# Patient Record
Sex: Female | Born: 1960 | Race: White | Hispanic: No | Marital: Married | Smoking: Former smoker
Health system: Southern US, Community
[De-identification: ages and names within clinical notes are randomized; demographics above are authoritative.]

## PROBLEM LIST (undated history)

## (undated) DIAGNOSIS — N883 Incompetence of cervix uteri: Secondary | ICD-10-CM

## (undated) DIAGNOSIS — J3081 Allergic rhinitis due to animal (cat) (dog) hair and dander: Secondary | ICD-10-CM

## (undated) DIAGNOSIS — J449 Chronic obstructive pulmonary disease, unspecified: Secondary | ICD-10-CM

## (undated) DIAGNOSIS — Z9889 Other specified postprocedural states: Secondary | ICD-10-CM

## (undated) DIAGNOSIS — C50919 Malignant neoplasm of unspecified site of unspecified female breast: Secondary | ICD-10-CM

## (undated) DIAGNOSIS — R112 Nausea with vomiting, unspecified: Secondary | ICD-10-CM

## (undated) DIAGNOSIS — R87619 Unspecified abnormal cytological findings in specimens from cervix uteri: Secondary | ICD-10-CM

## (undated) DIAGNOSIS — M1990 Unspecified osteoarthritis, unspecified site: Secondary | ICD-10-CM

## (undated) DIAGNOSIS — J45909 Unspecified asthma, uncomplicated: Secondary | ICD-10-CM

## (undated) DIAGNOSIS — F419 Anxiety disorder, unspecified: Secondary | ICD-10-CM

## (undated) HISTORY — DX: Unspecified abnormal cytological findings in specimens from cervix uteri: R87.619

## (undated) HISTORY — DX: Anxiety disorder, unspecified: F41.9

## (undated) HISTORY — PX: RETINAL TEAR REPAIR CRYOTHERAPY: SHX5304

## (undated) HISTORY — DX: Malignant neoplasm of unspecified site of unspecified female breast: C50.919

---

## 1988-02-16 DIAGNOSIS — R87619 Unspecified abnormal cytological findings in specimens from cervix uteri: Secondary | ICD-10-CM

## 1988-02-16 HISTORY — PX: CERVICAL CONIZATION W/BX: SHX1330

## 1988-02-16 HISTORY — DX: Unspecified abnormal cytological findings in specimens from cervix uteri: R87.619

## 1995-02-16 HISTORY — PX: APPENDECTOMY: SHX54

## 1999-01-28 ENCOUNTER — Encounter (INDEPENDENT_AMBULATORY_CARE_PROVIDER_SITE_OTHER): Payer: Self-pay | Admitting: Specialist

## 1999-01-28 ENCOUNTER — Ambulatory Visit (HOSPITAL_COMMUNITY): Admission: RE | Admit: 1999-01-28 | Discharge: 1999-01-28 | Payer: Self-pay | Admitting: Gastroenterology

## 1999-08-31 ENCOUNTER — Other Ambulatory Visit: Admission: RE | Admit: 1999-08-31 | Discharge: 1999-08-31 | Payer: Self-pay | Admitting: Gynecology

## 2000-08-19 ENCOUNTER — Ambulatory Visit: Admission: RE | Admit: 2000-08-19 | Discharge: 2000-08-19 | Payer: Self-pay | Admitting: Internal Medicine

## 2000-08-30 ENCOUNTER — Other Ambulatory Visit: Admission: RE | Admit: 2000-08-30 | Discharge: 2000-08-30 | Payer: Self-pay | Admitting: Gynecology

## 2000-09-20 ENCOUNTER — Ambulatory Visit: Admission: RE | Admit: 2000-09-20 | Discharge: 2000-09-20 | Payer: Self-pay | Admitting: Internal Medicine

## 2001-07-11 ENCOUNTER — Encounter: Payer: Self-pay | Admitting: Gastroenterology

## 2001-07-11 ENCOUNTER — Encounter: Admission: RE | Admit: 2001-07-11 | Discharge: 2001-07-11 | Payer: Self-pay | Admitting: Gastroenterology

## 2001-09-07 ENCOUNTER — Other Ambulatory Visit: Admission: RE | Admit: 2001-09-07 | Discharge: 2001-09-07 | Payer: Self-pay | Admitting: Gynecology

## 2002-09-06 ENCOUNTER — Encounter: Payer: Self-pay | Admitting: Gynecology

## 2002-09-06 ENCOUNTER — Encounter: Admission: RE | Admit: 2002-09-06 | Discharge: 2002-09-06 | Payer: Self-pay | Admitting: Gynecology

## 2002-09-19 ENCOUNTER — Other Ambulatory Visit: Admission: RE | Admit: 2002-09-19 | Discharge: 2002-09-19 | Payer: Self-pay | Admitting: Gynecology

## 2003-05-07 ENCOUNTER — Other Ambulatory Visit: Admission: RE | Admit: 2003-05-07 | Discharge: 2003-05-07 | Payer: Self-pay | Admitting: Gynecology

## 2003-09-13 ENCOUNTER — Encounter: Admission: RE | Admit: 2003-09-13 | Discharge: 2003-09-13 | Payer: Self-pay | Admitting: Gynecology

## 2003-11-05 ENCOUNTER — Other Ambulatory Visit: Admission: RE | Admit: 2003-11-05 | Discharge: 2003-11-05 | Payer: Self-pay | Admitting: Gynecology

## 2004-10-15 ENCOUNTER — Encounter: Admission: RE | Admit: 2004-10-15 | Discharge: 2004-10-15 | Payer: Self-pay | Admitting: Gynecology

## 2005-07-15 ENCOUNTER — Other Ambulatory Visit: Admission: RE | Admit: 2005-07-15 | Discharge: 2005-07-15 | Payer: Self-pay | Admitting: Gynecology

## 2005-11-23 ENCOUNTER — Encounter: Admission: RE | Admit: 2005-11-23 | Discharge: 2005-11-23 | Payer: Self-pay | Admitting: Gynecology

## 2006-12-01 ENCOUNTER — Encounter: Admission: RE | Admit: 2006-12-01 | Discharge: 2006-12-01 | Payer: Self-pay | Admitting: Gynecology

## 2007-08-01 ENCOUNTER — Other Ambulatory Visit: Admission: RE | Admit: 2007-08-01 | Discharge: 2007-08-01 | Payer: Self-pay | Admitting: Gynecology

## 2007-12-04 ENCOUNTER — Encounter: Admission: RE | Admit: 2007-12-04 | Discharge: 2007-12-04 | Payer: Self-pay | Admitting: Gynecology

## 2008-12-09 ENCOUNTER — Encounter: Admission: RE | Admit: 2008-12-09 | Discharge: 2008-12-09 | Payer: Self-pay | Admitting: Gynecology

## 2009-12-09 ENCOUNTER — Encounter: Admission: RE | Admit: 2009-12-09 | Discharge: 2009-12-09 | Payer: Self-pay | Admitting: Gynecology

## 2009-12-17 ENCOUNTER — Encounter: Admission: RE | Admit: 2009-12-17 | Discharge: 2009-12-17 | Payer: Self-pay | Admitting: Gynecology

## 2010-11-05 ENCOUNTER — Other Ambulatory Visit: Payer: Self-pay | Admitting: Gynecology

## 2010-11-05 DIAGNOSIS — Z1231 Encounter for screening mammogram for malignant neoplasm of breast: Secondary | ICD-10-CM

## 2010-12-23 ENCOUNTER — Ambulatory Visit: Payer: Self-pay

## 2010-12-25 ENCOUNTER — Ambulatory Visit
Admission: RE | Admit: 2010-12-25 | Discharge: 2010-12-25 | Disposition: A | Discharge status: Home / Self Care | Payer: BC Managed Care – PPO | Source: Ambulatory Visit | Attending: Gynecology | Admitting: Gynecology

## 2010-12-25 DIAGNOSIS — Z1231 Encounter for screening mammogram for malignant neoplasm of breast: Secondary | ICD-10-CM

## 2011-01-05 ENCOUNTER — Other Ambulatory Visit: Payer: Self-pay | Admitting: Gynecology

## 2011-01-05 ENCOUNTER — Ambulatory Visit
Admission: RE | Admit: 2011-01-05 | Discharge: 2011-01-05 | Disposition: A | Discharge status: Home / Self Care | Payer: BC Managed Care – PPO | Source: Ambulatory Visit | Attending: Gynecology | Admitting: Gynecology

## 2011-01-05 DIAGNOSIS — R928 Other abnormal and inconclusive findings on diagnostic imaging of breast: Secondary | ICD-10-CM

## 2011-12-07 ENCOUNTER — Other Ambulatory Visit: Payer: Self-pay | Admitting: Gynecology

## 2011-12-07 DIAGNOSIS — Z1231 Encounter for screening mammogram for malignant neoplasm of breast: Secondary | ICD-10-CM

## 2012-01-06 ENCOUNTER — Ambulatory Visit
Admission: RE | Admit: 2012-01-06 | Discharge: 2012-01-06 | Disposition: A | Discharge status: Home / Self Care | Payer: BC Managed Care – PPO | Source: Ambulatory Visit | Attending: Gynecology | Admitting: Gynecology

## 2012-01-06 DIAGNOSIS — Z1231 Encounter for screening mammogram for malignant neoplasm of breast: Secondary | ICD-10-CM

## 2012-12-11 ENCOUNTER — Other Ambulatory Visit: Payer: Self-pay

## 2012-12-11 DIAGNOSIS — Z1231 Encounter for screening mammogram for malignant neoplasm of breast: Secondary | ICD-10-CM

## 2013-01-16 ENCOUNTER — Ambulatory Visit
Admission: RE | Admit: 2013-01-16 | Discharge: 2013-01-16 | Disposition: A | Discharge status: Home / Self Care | Payer: BC Managed Care – PPO | Source: Ambulatory Visit

## 2013-01-16 DIAGNOSIS — Z1231 Encounter for screening mammogram for malignant neoplasm of breast: Secondary | ICD-10-CM

## 2013-02-20 ENCOUNTER — Other Ambulatory Visit: Payer: Self-pay | Admitting: Gynecology

## 2013-02-20 DIAGNOSIS — Z803 Family history of malignant neoplasm of breast: Secondary | ICD-10-CM

## 2013-03-02 ENCOUNTER — Ambulatory Visit
Admission: RE | Admit: 2013-03-02 | Discharge: 2013-03-02 | Disposition: A | Discharge status: Home / Self Care | Payer: BC Managed Care – PPO | Source: Ambulatory Visit | Attending: Gynecology | Admitting: Gynecology

## 2013-03-02 DIAGNOSIS — Z803 Family history of malignant neoplasm of breast: Secondary | ICD-10-CM

## 2013-03-02 MED ORDER — GADOBENATE DIMEGLUMINE 529 MG/ML IV SOLN
14.0000 mL | Freq: Once | INTRAVENOUS | Status: AC | PRN
  Administered 2013-03-02: 14 mL via INTRAVENOUS

## 2013-04-29 ENCOUNTER — Encounter (HOSPITAL_BASED_OUTPATIENT_CLINIC_OR_DEPARTMENT_OTHER): Payer: Self-pay | Admitting: Emergency Medicine

## 2013-04-29 ENCOUNTER — Emergency Department (HOSPITAL_BASED_OUTPATIENT_CLINIC_OR_DEPARTMENT_OTHER): Payer: BC Managed Care – PPO

## 2013-04-29 ENCOUNTER — Emergency Department (HOSPITAL_BASED_OUTPATIENT_CLINIC_OR_DEPARTMENT_OTHER)
Admission: EM | Admit: 2013-04-29 | Discharge: 2013-04-30 | Disposition: A | Discharge status: Home / Self Care | Payer: BC Managed Care – PPO | Attending: Emergency Medicine | Admitting: Emergency Medicine

## 2013-04-29 DIAGNOSIS — B9789 Other viral agents as the cause of diseases classified elsewhere: Secondary | ICD-10-CM | POA: Insufficient documentation

## 2013-04-29 DIAGNOSIS — B349 Viral infection, unspecified: Secondary | ICD-10-CM

## 2013-04-29 DIAGNOSIS — F17200 Nicotine dependence, unspecified, uncomplicated: Secondary | ICD-10-CM | POA: Insufficient documentation

## 2013-04-29 DIAGNOSIS — Z79899 Other long term (current) drug therapy: Secondary | ICD-10-CM | POA: Insufficient documentation

## 2013-04-29 DIAGNOSIS — R062 Wheezing: Secondary | ICD-10-CM

## 2013-04-29 HISTORY — DX: Allergic rhinitis due to animal (cat) (dog) hair and dander: J30.81

## 2013-04-29 MED ORDER — IPRATROPIUM-ALBUTEROL 0.5-2.5 (3) MG/3ML IN SOLN
RESPIRATORY_TRACT | Status: AC
  Administered 2013-04-29: 23:00:00
  Filled 2013-04-29: qty 3

## 2013-04-29 MED ORDER — ALBUTEROL SULFATE (2.5 MG/3ML) 0.083% IN NEBU
INHALATION_SOLUTION | RESPIRATORY_TRACT | Status: AC
  Administered 2013-04-29: 23:00:00
  Filled 2013-04-29: qty 3

## 2013-04-29 MED ORDER — IPRATROPIUM-ALBUTEROL 0.5-2.5 (3) MG/3ML IN SOLN
3.0000 mL | Freq: Once | RESPIRATORY_TRACT | Status: AC
  Administered 2013-04-29: 3 mL via RESPIRATORY_TRACT

## 2013-04-29 MED ORDER — IPRATROPIUM-ALBUTEROL 18-103 MCG/ACT IN AERO: 2.0000 | INHALATION_SPRAY | RESPIRATORY_TRACT | Status: DC | PRN

## 2013-04-29 MED ORDER — CETIRIZINE HCL 10 MG PO TABS: 10.0000 mg | ORAL_TABLET | Freq: Every day | ORAL | Status: DC

## 2013-04-29 MED ORDER — ALBUTEROL SULFATE (2.5 MG/3ML) 0.083% IN NEBU
2.5000 mg | INHALATION_SOLUTION | Freq: Once | RESPIRATORY_TRACT | Status: AC
  Administered 2013-04-29: 2.5 mg via RESPIRATORY_TRACT

## 2013-04-29 NOTE — ED Notes (Signed)
Flu sx since Thursday night- today feels more SOB

## 2013-04-29 NOTE — ED Provider Notes (Signed)
CSN: [id number]     Arrival date & time 04/29/13  2051 History   None    This chart was scribed for Trystin Terhune Alfonso Patten, MD by Forrestine Him, ED Scribe. This patient was seen in room MH12/MH12 and the patient's care was started 11:04 PM.   Chief Complaint  Patient presents with  . Shortness of Breath   Patient is a 53 y.o. female presenting with URI. The history is provided by the patient. No language interpreter was used.  URI Presenting symptoms: fever   Presenting symptoms: no congestion, no cough and no sore throat   Severity:  Mild Onset quality:  Gradual Timing:  Intermittent Progression:  Unchanged Chronicity:  New Relieved by:  Nothing Worsened by:  Nothing tried Ineffective treatments:  None tried Associated symptoms: myalgias, sneezing and wheezing   Associated symptoms: no neck pain and no swollen glands   Risk factors: not elderly, no recent illness and no recent travel     HPI Comments: Rachael Peters is a 53 y.o. female with a PMHx of cat allergies who presents to the Emergency Department complaining of Flu like symptoms x 4 days. Pt states she had the Flu this past weekend consisting of a fever, nausea, chills, and decrease in appetite. States some symptoms have resolved, but has noted new wheezing and chest congestion. She denies trying anything OTC for her recent symptoms. Denies any known allergies to medications. Pt has no other pertinent medical history. No other concerns this visit.  Past Medical History  Diagnosis Date  . Cat allergies    Past Surgical History  Procedure Laterality Date  . Cervical conization w/bx    . Appendectomy     No family history on file. History  Substance Use Topics  . Smoking status: Current Every Day Smoker    Types: Cigarettes  . Smokeless tobacco: Never Used  . Alcohol Use: No   OB History   Grav Para Term Preterm Abortions TAB SAB Ect Mult Living                 Review of Systems  Constitutional: Positive for  fever, chills and appetite change.  HENT: Positive for sneezing. Negative for congestion and sore throat.   Eyes: Negative for redness.  Respiratory: Positive for wheezing. Negative for cough.   Cardiovascular: Negative for chest pain, palpitations and leg swelling.  Gastrointestinal: Positive for nausea.  Musculoskeletal: Positive for myalgias. Negative for neck pain.  Skin: Negative for rash.  Psychiatric/Behavioral: Negative for confusion.  All other systems reviewed and are negative.      Allergies  Review of patient's allergies indicates no known allergies.  Home Medications   Current Outpatient Rx  Name  Route  Sig  Dispense  Refill  . albuterol (PROVENTIL HFA;VENTOLIN HFA) 108 (90 BASE) MCG/ACT inhaler   Inhalation   Inhale into the lungs every 6 (six) hours as needed for wheezing or shortness of breath.         . escitalopram (LEXAPRO) 5 MG tablet   Oral   Take 5 mg by mouth every other day.          Triage Vitals: BP 127/77  Pulse 89  Temp(Src) 98.6 F (37 C) (Oral)  Resp 20  Ht 5\' 10"  (1.778 m)  Wt 150 lb (68.04 kg)  BMI 21.52 kg/m2  SpO2 96%   Physical Exam  Nursing note and vitals reviewed. Constitutional: She is oriented to person, place, and time. She appears well-developed and well-nourished.  No distress.  HENT:  Head: Normocephalic and atraumatic.  Mouth/Throat: Uvula is midline and oropharynx is clear and moist. No oropharyngeal exudate.  Eyes: Conjunctivae and EOM are normal. Pupils are equal, round, and reactive to light.  Neck: Normal range of motion. Neck supple.  Cardiovascular: Normal rate, regular rhythm and normal heart sounds.   Pulmonary/Chest: Effort normal and breath sounds normal. No stridor. No respiratory distress. She has no wheezes. She has no rales.  Abdominal: Soft. She exhibits no distension. There is no tenderness. There is no rebound and no guarding.  Musculoskeletal: Normal range of motion. She exhibits no edema.   Neurological: She is alert and oriented to person, place, and time.  Skin: Skin is warm and dry.  Psychiatric: She has a normal mood and affect. Judgment normal.    ED Course  Procedures (including critical care time)  DIAGNOSTIC STUDIES: Oxygen Saturation is 96% on RA, adequate by my interpretation.    COORDINATION OF CARE: 11:17 PM- Will give albuterol treatment. Discussed treatment plan with pt at bedside and pt agreed to plan.     Labs Review Labs Reviewed - No data to display Imaging Review No results found.   EKG Interpretation None      MDM   Final diagnoses:  None    Viral syndrome with wheezing, will prescribe combivent inhaler have recommended smoking cessation and mucinex DM for relief and tylenol alternating with motrin for fever control. Patient verbalizes understanding and agrees to follow up  I personally performed the services described in this documentation, which was scribed in my presence. The recorded information has been reviewed and is accurate.    Carlisle Beers, MD 04/30/13 [phone]

## 2014-01-23 ENCOUNTER — Other Ambulatory Visit: Payer: Self-pay

## 2014-01-23 DIAGNOSIS — Z1231 Encounter for screening mammogram for malignant neoplasm of breast: Secondary | ICD-10-CM

## 2014-02-13 ENCOUNTER — Ambulatory Visit
Admission: RE | Admit: 2014-02-13 | Discharge: 2014-02-13 | Disposition: A | Discharge status: Home / Self Care | Payer: BC Managed Care – PPO | Source: Ambulatory Visit

## 2014-02-13 DIAGNOSIS — Z1231 Encounter for screening mammogram for malignant neoplasm of breast: Secondary | ICD-10-CM

## 2014-07-26 ENCOUNTER — Other Ambulatory Visit: Payer: Self-pay | Admitting: Cardiology

## 2014-07-26 DIAGNOSIS — R0602 Shortness of breath: Secondary | ICD-10-CM

## 2014-07-26 DIAGNOSIS — R079 Chest pain, unspecified: Secondary | ICD-10-CM

## 2014-07-29 ENCOUNTER — Ambulatory Visit
Admission: RE | Admit: 2014-07-29 | Discharge: 2014-07-29 | Disposition: A | Discharge status: Home / Self Care | Payer: BLUE CROSS/BLUE SHIELD | Source: Ambulatory Visit | Attending: Cardiology | Admitting: Cardiology

## 2014-07-29 ENCOUNTER — Ambulatory Visit
Admission: RE | Admit: 2014-07-29 | Discharge: 2014-07-29 | Disposition: A | Discharge status: Home / Self Care | Payer: No Typology Code available for payment source | Source: Ambulatory Visit | Attending: Cardiology | Admitting: Cardiology

## 2014-07-29 DIAGNOSIS — R079 Chest pain, unspecified: Secondary | ICD-10-CM

## 2014-07-29 DIAGNOSIS — R0602 Shortness of breath: Secondary | ICD-10-CM

## 2014-07-30 ENCOUNTER — Ambulatory Visit: Payer: Self-pay | Admitting: Internal Medicine

## 2015-01-30 ENCOUNTER — Other Ambulatory Visit: Payer: Self-pay

## 2015-01-30 DIAGNOSIS — Z803 Family history of malignant neoplasm of breast: Secondary | ICD-10-CM

## 2015-01-30 DIAGNOSIS — Z1231 Encounter for screening mammogram for malignant neoplasm of breast: Secondary | ICD-10-CM

## 2015-02-28 ENCOUNTER — Ambulatory Visit
Admission: RE | Admit: 2015-02-28 | Discharge: 2015-02-28 | Disposition: A | Discharge status: Home / Self Care | Payer: BLUE CROSS/BLUE SHIELD | Source: Ambulatory Visit

## 2015-02-28 DIAGNOSIS — Z1231 Encounter for screening mammogram for malignant neoplasm of breast: Secondary | ICD-10-CM

## 2015-02-28 DIAGNOSIS — Z803 Family history of malignant neoplasm of breast: Secondary | ICD-10-CM

## 2015-03-15 ENCOUNTER — Encounter (HOSPITAL_BASED_OUTPATIENT_CLINIC_OR_DEPARTMENT_OTHER): Payer: Self-pay | Admitting: *Deleted

## 2015-03-15 ENCOUNTER — Emergency Department (HOSPITAL_BASED_OUTPATIENT_CLINIC_OR_DEPARTMENT_OTHER)
Admission: EM | Admit: 2015-03-15 | Discharge: 2015-03-15 | Disposition: A | Discharge status: Home / Self Care | Payer: BLUE CROSS/BLUE SHIELD | Attending: Emergency Medicine | Admitting: Emergency Medicine

## 2015-03-15 ENCOUNTER — Emergency Department (HOSPITAL_BASED_OUTPATIENT_CLINIC_OR_DEPARTMENT_OTHER): Payer: BLUE CROSS/BLUE SHIELD

## 2015-03-15 DIAGNOSIS — J45901 Unspecified asthma with (acute) exacerbation: Secondary | ICD-10-CM | POA: Diagnosis not present

## 2015-03-15 DIAGNOSIS — Z87891 Personal history of nicotine dependence: Secondary | ICD-10-CM | POA: Diagnosis not present

## 2015-03-15 DIAGNOSIS — Z79899 Other long term (current) drug therapy: Secondary | ICD-10-CM | POA: Diagnosis not present

## 2015-03-15 DIAGNOSIS — Z8739 Personal history of other diseases of the musculoskeletal system and connective tissue: Secondary | ICD-10-CM | POA: Diagnosis not present

## 2015-03-15 DIAGNOSIS — R0602 Shortness of breath: Secondary | ICD-10-CM | POA: Diagnosis present

## 2015-03-15 HISTORY — DX: Unspecified osteoarthritis, unspecified site: M19.90

## 2015-03-15 MED ORDER — METHYLPREDNISOLONE SODIUM SUCC 125 MG IJ SOLR: 125.0000 mg | Freq: Once | INTRAMUSCULAR | Status: DC

## 2015-03-15 MED ORDER — IPRATROPIUM BROMIDE 0.02 % IN SOLN
RESPIRATORY_TRACT | Status: AC
Start: 2015-03-15 — End: 2015-03-15
  Administered 2015-03-15: 0.5 mg via RESPIRATORY_TRACT
  Filled 2015-03-15: qty 2.5

## 2015-03-15 MED ORDER — ALBUTEROL SULFATE HFA 108 (90 BASE) MCG/ACT IN AERS
2.0000 | INHALATION_SPRAY | RESPIRATORY_TRACT | Status: DC | PRN
  Administered 2015-03-15: 2 via RESPIRATORY_TRACT
  Filled 2015-03-15: qty 6.7

## 2015-03-15 MED ORDER — DEXAMETHASONE SODIUM PHOSPHATE 10 MG/ML IJ SOLN
10.0000 mg | Freq: Once | INTRAMUSCULAR | Status: AC
  Administered 2015-03-15: 10 mg via INTRAMUSCULAR
  Filled 2015-03-15: qty 1

## 2015-03-15 MED ORDER — ALBUTEROL SULFATE (2.5 MG/3ML) 0.083% IN NEBU
INHALATION_SOLUTION | RESPIRATORY_TRACT | Status: AC
  Administered 2015-03-15: 5 mg via RESPIRATORY_TRACT
  Filled 2015-03-15: qty 6

## 2015-03-15 MED ORDER — PREDNISONE 20 MG PO TABS: 40.0000 mg | ORAL_TABLET | Freq: Every day | ORAL | Status: DC

## 2015-03-15 MED ORDER — IPRATROPIUM BROMIDE 0.02 % IN SOLN
0.5000 mg | RESPIRATORY_TRACT | Status: AC
  Administered 2015-03-15: 0.5 mg via RESPIRATORY_TRACT

## 2015-03-15 MED ORDER — ALBUTEROL SULFATE (2.5 MG/3ML) 0.083% IN NEBU
5.0000 mg | INHALATION_SOLUTION | RESPIRATORY_TRACT | Status: AC
  Administered 2015-03-15: 5 mg via RESPIRATORY_TRACT

## 2015-03-15 NOTE — ED Notes (Signed)
Patient is alert and oriented x4.  She is complaining of shortness of breath.  Patient has a history asthma. She states that the shortness of breath started last night and she has been in bed all day.  Lung sounds are Tight with exhaling wheezes.

## 2015-03-15 NOTE — ED Notes (Signed)
Notified [place], [place], of patients oxygen sat 92% - states okay to discharge patient home at this time.

## 2015-03-15 NOTE — Discharge Instructions (Signed)
Your shortness of breath was likely due to an asthma exacerbation. Your chest x-ray was negative for any pneumonia. Please take your medications as prescribed. Follow-up with your doctor next week for reevaluation and further medication management. Return to ED for any new or worsening symptoms as we discussed.  Asthma, Acute Bronchospasm Acute bronchospasm caused by asthma is also referred to as an asthma attack. Bronchospasm means your air passages become narrowed. The narrowing is caused by inflammation and tightening of the muscles in the air tubes (bronchi) in your lungs. This can make it hard to breathe or cause you to wheeze and cough. CAUSES Possible triggers are:  Animal dander from the skin, hair, or feathers of animals.  Dust mites contained in house dust.  Cockroaches.  Pollen from trees or grass.  Mold.  Cigarette or tobacco smoke.  Air pollutants such as dust, household cleaners, hair sprays, aerosol sprays, paint fumes, strong chemicals, or strong odors.  Cold air or weather changes. Cold air may trigger inflammation. Winds increase molds and pollens in the air.  Strong emotions such as crying or laughing hard.  Stress.  Certain medicines such as aspirin or beta-blockers.  Sulfites in foods and drinks, such as dried fruits and wine.  Infections or inflammatory conditions, such as a flu, cold, or inflammation of the nasal membranes (rhinitis).  Gastroesophageal reflux disease (GERD). GERD is a condition where stomach acid backs up into your esophagus.  Exercise or strenuous activity. SIGNS AND SYMPTOMS   Wheezing.  Excessive coughing, particularly at night.  Chest tightness.  Shortness of breath. DIAGNOSIS  Your health care provider will ask you about your medical history and perform a physical exam. A chest X-ray or blood testing may be performed to look for other causes of your symptoms or other conditions that may have triggered your asthma  attack. TREATMENT  Treatment is aimed at reducing inflammation and opening up the airways in your lungs. Most asthma attacks are treated with inhaled medicines. These include quick relief or rescue medicines (such as bronchodilators) and controller medicines (such as inhaled corticosteroids). These medicines are sometimes given through an inhaler or a nebulizer. Systemic steroid medicine taken by mouth or given through an IV tube also can be used to reduce the inflammation when an attack is moderate or severe. Antibiotic medicines are only used if a bacterial infection is present.  HOME CARE INSTRUCTIONS   Rest.  Drink plenty of liquids. This helps the mucus to remain thin and be easily coughed up. Only use caffeine in moderation and do not use alcohol until you have recovered from your illness.  Do not smoke. Avoid being exposed to secondhand smoke.  You play a critical role in keeping yourself in good health. Avoid exposure to things that cause you to wheeze or to have breathing problems.  Keep your medicines up-to-date and available. Carefully follow your health care provider's treatment plan.  Take your medicine exactly as prescribed.  When pollen or pollution is bad, keep windows closed and use an air conditioner or go to places with air conditioning.  Asthma requires careful medical care. See your health care provider for a follow-up as advised. If you are more than [redacted] weeks pregnant and you were prescribed any new medicines, let your obstetrician know about the visit and how you are doing. Follow up with your health care provider as directed.  After you have recovered from your asthma attack, make an appointment with your outpatient doctor to talk about ways to  reduce the likelihood of future attacks. If you do not have a doctor who manages your asthma, make an appointment with a primary care doctor to discuss your asthma. SEEK IMMEDIATE MEDICAL CARE IF:   You are getting  worse.  You have trouble breathing. If severe, call your local emergency services (911 in the [place]  You develop chest pain or discomfort.  You are vomiting.  You are not able to keep fluids down.  You are coughing up yellow, green, brown, or bloody sputum.  You have a fever and your symptoms suddenly get worse.  You have trouble swallowing. MAKE SURE YOU:   Understand these instructions.  Will watch your condition.  Will get help right away if you are not doing well or get worse.   This information is not intended to replace advice given to you by your health care provider. Make sure you discuss any questions you have with your health care provider.   Document Released: 05/19/2006 Document Revised: 02/06/2013 Document Reviewed: 08/09/2012 Elsevier Interactive Patient Education [employer].

## 2015-03-15 NOTE — ED Notes (Addendum)
Patient transported to X-ray 

## 2015-03-15 NOTE — ED Provider Notes (Signed)
CSN: [id number]     Arrival date & time 03/15/15  1939 History   First MD Initiated Contact with Patient 03/15/15 1952     Chief Complaint  Patient presents with  . Shortness of Breath     (Consider location/radiation/quality/duration/timing/severity/associated sxs/prior Treatment) HPI Rachael Peters is a 55 y.o. female with a history of asthma, comes in for evaluation of shortness of breath. Patient reports since yesterday evening she has had a productive cough with yellow sputum. She also reports a history of asthma that is usually well-controlled with a short acting inhaler. However, she reports this evening becoming increasingly more short of breath, not relieved by her albuterol inhaler. She reports that she may be allergic to cats, she has 3 cats and they were all in her bed today and she believes this may have exacerbated her symptoms. No fevers, chills, leg swelling, recent travel, surgeries, immobilizations or history of PE.  Past Medical History  Diagnosis Date  . Cat allergies   . Arthritis    Past Surgical History  Procedure Laterality Date  . Cervical conization w/bx    . Appendectomy     No family history on file. Social History  Substance Use Topics  . Smoking status: Former Smoker    Types: Cigarettes  . Smokeless tobacco: Never Used  . Alcohol Use: No   OB History    No data available     Review of Systems A 10 point review of systems was completed and was negative except for pertinent positives and negatives as mentioned in the history of present illness     Allergies  Review of patient's allergies indicates no known allergies.  Home Medications   Prior to Admission medications   Medication Sig Start Date End Date Taking? Authorizing Provider  albuterol (PROVENTIL HFA;VENTOLIN HFA) 108 (90 BASE) MCG/ACT inhaler Inhale into the lungs every 6 (six) hours as needed for wheezing or shortness of breath.    Historical Provider, MD  albuterol-ipratropium  (COMBIVENT) 18-103 MCG/ACT inhaler Inhale 2 puffs into the lungs every 4 (four) hours as needed for wheezing or shortness of breath. 04/29/13   April Palumbo, MD  cetirizine (ZYRTEC ALLERGY) 10 MG tablet Take 1 tablet (10 mg total) by mouth daily. 04/29/13   April Palumbo, MD  escitalopram (LEXAPRO) 5 MG tablet Take 5 mg by mouth every other day.    Historical Provider, MD  predniSONE (DELTASONE) 20 MG tablet Take 2 tablets (40 mg total) by mouth daily. 03/15/15   Comer Locket, PA-C   BP 126/74 mmHg  Pulse 90  Temp(Src) 98.5 F (36.9 C) (Oral)  Resp 22  Ht 5\' 10"  (1.778 m)  Wt 70.308 kg  BMI 22.24 kg/m2  SpO2 92% Physical Exam  Constitutional: She is oriented to person, place, and time. She appears well-developed and well-nourished.  HENT:  Head: Normocephalic and atraumatic.  Mouth/Throat: Oropharynx is clear and moist.  Eyes: Conjunctivae are normal. Pupils are equal, round, and reactive to light. Right eye exhibits no discharge. Left eye exhibits no discharge. No scleral icterus.  Neck: Neck supple.  Cardiovascular: Normal rate, regular rhythm and normal heart sounds.   Pulmonary/Chest: Effort normal and breath sounds normal. No respiratory distress. She has no rales.  Patient has diffuse inspiratory and expiratory wheezes. Worse on the right side.  Abdominal: Soft. There is no tenderness.  Musculoskeletal: She exhibits no tenderness.  Neurological: She is alert and oriented to person, place, and time.  Cranial Nerves II-XII grossly intact  Skin: Skin is warm and dry. No rash noted.  Psychiatric: She has a normal mood and affect.  Nursing note and vitals reviewed.   ED Course  Procedures (including critical care time) Labs Review Labs Reviewed - No data to display  Imaging Review Dg Chest 2 View  03/15/2015  CLINICAL DATA:  55 year old with current history of asthma presenting with 1 day history of wheezing and shortness of breath. EXAM: CHEST  2 VIEW COMPARISON:   07/29/2014, 04/29/2013. FINDINGS: Cardiac silhouette normal in size, unchanged. Thoracic aorta mildly tortuous and atherosclerotic, unchanged. Hilar and mediastinal contours otherwise unremarkable. Hyperinflation, mildly prominent bronchovascular markings diffusely and mild central peribronchial thickening, more so than on the prior examinations. Lungs otherwise clear. No localized airspace consolidation. No pleural effusions. No pneumothorax. Normal pulmonary vascularity. Visualized bony thorax intact. IMPRESSION: Mild changes of acute asthma and/or bronchitis without focal airspace pneumonia. Electronically Signed   By: Evangeline Dakin M.D.   On: 03/15/2015 20:33   I have personally reviewed and evaluated these images and lab results as part of my medical decision-making.   EKG Interpretation None     Meds given in ED:  Medications  albuterol (PROVENTIL HFA;VENTOLIN HFA) 108 (90 Base) MCG/ACT inhaler 2 puff (2 puffs Inhalation Given 03/15/15 2014)  albuterol (PROVENTIL) (2.5 MG/3ML) 0.083% nebulizer solution 5 mg (5 mg Nebulization Given 03/15/15 1954)  ipratropium (ATROVENT) nebulizer solution 0.5 mg (0.5 mg Nebulization Given 03/15/15 1953)  dexamethasone (DECADRON) injection 10 mg (10 mg Intramuscular Given 03/15/15 2029)    New Prescriptions   PREDNISONE (DELTASONE) 20 MG TABLET    Take 2 tablets (40 mg total) by mouth daily.   Filed Vitals:   03/15/15 1954 03/15/15 2000 03/15/15 2123 03/15/15 2126  BP:   126/74   Pulse:  95  90  Temp:      TempSrc:      Resp:      Height:      Weight:      SpO2: 91% 99%  92%    MDM  Rachael Peters is a 55 y.o. female with history of asthma who comes in for evaluation of shortness of breath. Patient symptoms consistent with asthma exacerbation. Due to productive cough at home with shortness of breath symptoms, will obtain chest x-ray. Chest x-ray is negative for focal consolidation and shows some bronchitic changes. Patient received one  breathing treatment in the emergency department and responded very well. She reports she feels much better. Her wheezing has greatly improved. She was given 10 mg of IM Decadron in the emergency department and also given another albuterol inhaler. She'll be discharged home with a steroid pack and instructions to follow-up with PCP next week for reevaluation. Prior to discharge, I personally checked patient's oxygen saturation was 95% on room air. Low suspicion for PE, low wells score. Patient and husband at bedside verbalized understanding of instructions and agrees with plan and subsequent discharge. Final diagnoses:  Asthma exacerbation        Comer Locket, PA-C 03/16/15 [place], MD 03/19/15 [phone]

## 2015-03-15 NOTE — ED Notes (Signed)
EDPA at bedside, oxygen sat 95%. Patient states she is breathing better. EDPA states ok to discharge.

## 2015-06-05 ENCOUNTER — Encounter: Payer: Self-pay | Admitting: Obstetrics and Gynecology

## 2015-06-16 ENCOUNTER — Encounter: Payer: Self-pay | Admitting: Obstetrics and Gynecology

## 2015-06-16 ENCOUNTER — Ambulatory Visit (INDEPENDENT_AMBULATORY_CARE_PROVIDER_SITE_OTHER): Payer: BLUE CROSS/BLUE SHIELD | Admitting: Obstetrics and Gynecology

## 2015-06-16 VITALS — BP 122/88 | HR 68 | Resp 14 | Ht 69.5 in | Wt 156.2 lb

## 2015-06-16 DIAGNOSIS — N951 Menopausal and female climacteric states: Secondary | ICD-10-CM | POA: Diagnosis not present

## 2015-06-16 DIAGNOSIS — Z803 Family history of malignant neoplasm of breast: Secondary | ICD-10-CM

## 2015-06-16 DIAGNOSIS — Z01419 Encounter for gynecological examination (general) (routine) without abnormal findings: Secondary | ICD-10-CM | POA: Diagnosis not present

## 2015-06-16 NOTE — Patient Instructions (Signed)
EXERCISE AND DIET:  We recommended that you start or continue a regular exercise program for good health. Regular exercise means any activity that makes your heart beat faster and makes you sweat.  We recommend exercising at least 30 minutes per day at least 3 days a week, preferably 4 or 5.  We also recommend a diet low in fat and sugar.  Inactivity, poor dietary choices and obesity can cause diabetes, heart attack, stroke, and kidney damage, among others.    ALCOHOL AND SMOKING:  Women should limit their alcohol intake to no more than 7 drinks/beers/glasses of wine (combined, not each!) per week. Moderation of alcohol intake to this level decreases your risk of breast cancer and liver damage. And of course, no recreational drugs are part of a healthy lifestyle.  And absolutely no smoking or even second hand smoke. Most people know smoking can cause heart and lung diseases, but did you know it also contributes to weakening of your bones? Aging of your skin?  Yellowing of your teeth and nails?  CALCIUM AND VITAMIN D:  Adequate intake of calcium and Vitamin D are recommended.  The recommendations for exact amounts of these supplements seem to change often, but generally speaking 600 mg of calcium (either carbonate or citrate) and 800 units of Vitamin D per day seems prudent. Certain women may benefit from higher intake of Vitamin D.  If you are among these women, your doctor will have told you during your visit.    PAP SMEARS:  Pap smears, to check for cervical cancer or precancers,  have traditionally been done yearly, although recent scientific advances have shown that most women can have pap smears less often.  However, every woman still should have a physical exam from her gynecologist every year. It will include a breast check, inspection of the vulva and vagina to check for abnormal growths or skin changes, a visual exam of the cervix, and then an exam to evaluate the size and shape of the uterus and  ovaries.  And after 53 years of age, a rectal exam is indicated to check for rectal cancers. We will also provide age appropriate advice regarding health maintenance, like when you should have certain vaccines, screening for sexually transmitted diseases, bone density testing, colonoscopy, mammograms, etc.   MAMMOGRAMS:  All women over 40 years old should have a yearly mammogram. Many facilities now offer a "3D" mammogram, which may cost around $50 extra out of pocket. If possible,  we recommend you accept the option to have the 3D mammogram performed.  It both reduces the number of women who will be called back for extra views which then turn out to be normal, and it is better than the routine mammogram at detecting truly abnormal areas.    COLONOSCOPY:  Colonoscopy to screen for colon cancer is recommended for all women at age 50.  We know, you hate the idea of the prep.  We agree, BUT, having colon cancer and not knowing it is worse!!  Colon cancer so often starts as a polyp that can be seen and removed at colonscopy, which can quite literally save your life!  And if your first colonoscopy is normal and you have no family history of colon cancer, most women don't have to have it again for 10 years.  Once every ten years, you can do something that may end up saving your life, right?  We will be happy to help you get it scheduled when you are ready.    Be sure to check your insurance coverage so you understand how much it will cost.  It may be covered as a preventative service at no cost, but you should check your particular policy.     Consider treating hot flashes with Catapress transdermal patch.   Menopause and Herbal Products WHAT IS MENOPAUSE? Menopause is the normal time of life when menstrual periods decrease in frequency and eventually stop completely. This process can take several years for some women. Menopause is complete when you have had an absence of menstruation for a full year since your  last menstrual period. It usually occurs between the ages of 63 and 10. It is not common for menopause to begin before the age of 58. During menopause, your body stops producing the female hormones estrogen and progesterone. Common symptoms associated with this loss of hormones (vasomotor symptoms) are:  Hot flashes.  Hot flushes.  Night sweats. Other common symptoms and complications of menopause include:  Decrease in sex drive.  Vaginal dryness and thinning of the walls of the vagina. This can make sex painful.  Dryness of the skin and development of wrinkles.  Headaches.  Tiredness.  Irritability.  Memory problems.  Weight gain.  Bladder infections.  Hair growth on the face and chest.  Inability to reproduce offspring (infertility).  Loss of density in the bones (osteoporosis) increasing your risk for breaks (fractures).  Depression.  Hardening and narrowing of the arteries (atherosclerosis). This increases your risk of heart attack and stroke. WHAT TREATMENT OPTIONS ARE AVAILABLE? There are many treatment choices for menopause symptoms. The most common treatment is hormone replacement therapy. Many alternative therapies for menopause are emerging, including the use of herbal products. These supplements can be found in the form of herbs, teas, oils, tinctures, and pills. Common herbal supplements for menopause are made from plants that contain phytoestrogens. Phytoestrogens are compounds that occur naturally in plants and plant products. They act like estrogen in the body. Foods and herbs that contain phytoestrogens include:  Soy.  Flax seeds.  Red clover.  Ginseng. WHAT MENOPAUSE SYMPTOMS MAY BE HELPED IF I USE HERBAL PRODUCTS?  Vasomotor symptoms. These may be helped by:  Soy. Some studies show that soy may have a moderate benefit for hot flashes.  Black cohosh. There is limited evidence indicating this may be beneficial for hot flashes.  Symptoms that are  related to heart and blood vessel disease. These may be helped by soy. Studies have shown that soy can help to lower cholesterol.  Depression. This may be helped by:  St. John's wort. There is limited evidence that shows this may help mild to moderate depression.  Black cohosh. There is evidence that this may help depression and mood swings.  Osteoporosis. Soy may help to decrease bone loss that is associated with menopause and may prevent osteoporosis. Limited evidence indicates that red clover may offer some bone loss protection as well. Other herbal products that are commonly used during menopause lack enough evidence to support their use as a replacement for conventional menopause therapies. These products include evening primrose, ginseng, and red clover. WHAT ARE THE CASES WHEN HERBAL PRODUCTS SHOULD NOT BE USED DURING MENOPAUSE? Do not use herbal products during menopause without your health care provider's approval if:  You are taking medicine.  You have a preexisting liver condition. ARE THERE ANY RISKS IN MY TAKING HERBAL PRODUCTS DURING MENOPAUSE? If you choose to use herbal products to help with symptoms of menopause, keep in mind that:  Different supplements have different and unmeasured amounts of herbal ingredients.  Herbal products are not regulated the same way that medicines are.  Concentrations of herbs may vary depending on the way they are prepared. For example, the concentration may be different in a pill, tea, oil, and tincture.  Little is known about the risks of using herbal products, particularly the risks of long-term use.  Some herbal supplements can be harmful when combined with certain medicines. Most commonly reported side effects of herbal products are mild. However, if used improperly, many herbal supplements can cause serious problems. Talk to your health care provider before starting any herbal product. If problems develop, stop taking the supplement  and let your health care provider know.   This information is not intended to replace advice given to you by your health care provider. Make sure you discuss any questions you have with your health care provider.   Document Released: 07/21/2007 Document Revised: 02/22/2014 Document Reviewed: 07/17/2013 Elsevier Interactive Patient Education [employer].

## 2015-06-16 NOTE — Progress Notes (Signed)
Patient ID: Rachael Peters, female   DOB: 25-May-1960, 55 y.o.   MRN: [id number] 55 y.o. G58P0 Married Caucasian female here for annual exam.    LMP was 2 - 3 years ago.  Hot flashes.  Using essential oils which help. Difficulty with presentations due to this.  Difficulty with sleep due to night sweats.  Declines HRT. Does accupuncture.   Taking Lexapro 5 mg po bid for anxiety.   Works really well, and she does not want to change this.  Mother and sister with breast cancer.  Sister is BRCA negative.   Works for [employer].  [place].   Referred by Dr. Carren Rang.   PCP: Claris Gower, MD  Patient's last menstrual period was 02/15/2013 (approximate).           Sexually active: Yes.   female The current method of family planning is post menopausal status.    Exercising: Yes.    cardio 2x/week. Smoker:  Former--quit 6weeks ago  Health Maintenance: Pap:  02/2014 normal per patient History of abnormal Pap:  Yes, Hx of conization of cervix 1990--paps normal since. MMG: 03-03-15 3D/Density C/Neg:BiRads1:[facility] Colonoscopy:  2014 normal with [facility];next due 2024 BMD:   n/a  Result  n/a TDaP:  PCP Gardasil:   N/A HIV:  Done 20 years ago, and was negative.  Hep C:  Done 20 years ago, and was negative.  Screening Labs:  Hb today: Labs thru [facility], Urine today: Neg   reports that she quit smoking about 6 weeks ago. Her smoking use included Cigarettes. She has never used smokeless tobacco. She reports that she drinks alcohol. She reports that she does not use illicit drugs.  Past Medical History  Diagnosis Date  . Cat allergies   . Arthritis   . Abnormal Pap smear of cervix 1990    hx cervical conization for abnormal pap--paps normal since  . Anxiety     Past Surgical History  Procedure Laterality Date  . Cervical conization w/bx    . Appendectomy    . Cervix lesion destruction  1990    Current Outpatient Prescriptions  Medication Sig Dispense Refill  .  escitalopram (LEXAPRO) 5 MG tablet Take 1 tablet by mouth 2 (two) times daily.  4  . montelukast (SINGULAIR) 10 MG tablet Take 10 mg by mouth daily.  2  . zolpidem (AMBIEN) 5 MG tablet Take 5 mg by mouth at bedtime. Reported on 06/16/2015  0   No current facility-administered medications for this visit.    Family History  Problem Relation Age of Onset  . Heart disease Maternal Grandmother   . Heart disease Maternal Grandfather   . Diabetes Maternal Grandfather   . Heart disease Paternal Grandmother   . Heart disease Paternal Grandfather   . Breast cancer Mother 3  . Breast cancer Sister 28  . Heart disease Father   . Stroke Father   . Heart attack Father     ROS:  Pertinent items are noted in HPI.  Otherwise, a comprehensive ROS was negative.  Exam:   BP 122/88 mmHg  Pulse 68  Resp 14  Ht 5' 9.5" (1.765 m)  Wt 156 lb 3.2 oz (70.852 kg)  BMI 22.74 kg/m2  LMP 02/15/2013 (Approximate)    General appearance: alert, cooperative and appears stated age Head: Normocephalic, without obvious abnormality, atraumatic Neck: no adenopathy, supple, symmetrical, trachea midline and thyroid normal to inspection and palpation Lungs: clear to auscultation bilaterally Breasts: normal appearance, no masses or  tenderness, Inspection negative, No nipple retraction or dimpling, No nipple discharge or bleeding, No axillary or supraclavicular adenopathy Heart: regular rate and rhythm Abdomen: incisions:  No.    , soft, non-tender; no masses, no organomegaly Extremities: extremities normal, atraumatic, no cyanosis or edema Skin: Skin color, texture, turgor normal. No rashes or lesions Lymph nodes: Cervical, supraclavicular, and axillary nodes normal. No abnormal inguinal nodes palpated Neurologic: Grossly normal  Pelvic: External genitalia:  no lesions              Urethra:  normal appearing urethra with no masses, tenderness or lesions              Bartholins and Skenes: normal                  Vagina: normal appearing vagina with normal color and discharge, no lesions              Cervix: no lesions              Pap taken: Yes.   Bimanual Exam:  Uterus:  normal size, contour, position, consistency, mobility, non-tender              Adnexa: normal adnexa and no mass, fullness, tenderness              Rectal exam: Yes.  .  Confirms.              Anus:  normal sphincter tone, no lesions  Chaperone was present for exam.  Assessment:   Well woman visit with normal exam. Menopausal female. Symptomatic.  On Lexapro. FH of breast cancer in mother and sister.   Sister is BRCA negative.  Remote hx of cervical conization.  FH CAD.   Plan: Yearly mammogram recommended after age 37.  Does 3D yearly.  Recommended self breast exam.  Pap and HR HPV as above. Discussed Calcium, Vitamin D, regular exercise program including cardiovascular and weight bearing exercise. Labs performed.  No..     Prescription medication(s) given.  No. .   Discussed possible Catapress for menopausal symptoms.  Patient will discuss with her PCP. Also gave information on menopause and herbal remedies for hot flashes.  Discussed HRT and potential risks of breast cancer, MI, Stroke, DVT, PE.  Not recommended and declined by patient.  Reading about menopause to patient.  Follow up annually and prn.      After visit summary provided.

## 2015-06-18 LAB — IPS PAP TEST WITH HPV

## 2016-04-14 ENCOUNTER — Other Ambulatory Visit: Payer: Self-pay | Admitting: Obstetrics and Gynecology

## 2016-04-14 DIAGNOSIS — Z1231 Encounter for screening mammogram for malignant neoplasm of breast: Secondary | ICD-10-CM

## 2016-04-30 ENCOUNTER — Ambulatory Visit
Admission: RE | Admit: 2016-04-30 | Discharge: 2016-04-30 | Disposition: A | Discharge status: Home / Self Care | Payer: BLUE CROSS/BLUE SHIELD | Source: Ambulatory Visit | Attending: Obstetrics and Gynecology | Admitting: Obstetrics and Gynecology

## 2016-04-30 DIAGNOSIS — Z1231 Encounter for screening mammogram for malignant neoplasm of breast: Secondary | ICD-10-CM

## 2016-06-18 NOTE — Progress Notes (Signed)
56 y.o. G32P0100 Married Caucasian female here for annual exam.    Mother and sister with breast cancer.  Sister is BRCA negative.   Some limited screening labs at [facility].  Wants routine labs and additional testing including STD screening.  She wants all testing today coded under well woman visit even if insurance will not pay.  Had eyebrows tatooed. Gets Botox.   Dad is ill right now.   Stress smoking.   PCP:  Claris Gower, MD   Patient's last menstrual period was 02/15/2013 (approximate).           Sexually active: Yes.   female The current method of family planning is post menopausal status.    Exercising: Yes.    Cardio and strength training 2x/week and walks dogs daily Smoker:  Yes, restarted smoking 11/2015--stress--smokes 1/2ppd  Health Maintenance: Pap: 06-16-15 Neg:Neg HR HPV;02/2014 normal per patient History of abnormal Pap:  Yes, Hx of conization of cervix 1990--paps normal since.  MMG: 04-30-16 Density D/Neg/BiRads1:[facility] Colonoscopy:  2014 normal with [facility];next due 2024 BMD: 2016  Result: Normal with Dr.Mezer TDaP:  PCP Gardasil:   no HIV: Neg with pregnancy Hep C: Wants to be tested Screening Labs:   Urine today: not done   reports that she has been smoking Cigarettes.  She started smoking about 7 months ago. She has been smoking about 0.50 packs per day. She has never used smokeless tobacco. She reports that she drinks about 1.2 oz of alcohol per week . She reports that she does not use drugs.  Past Medical History:  Diagnosis Date  . Abnormal Pap smear of cervix 1990   hx cervical conization for abnormal pap--paps normal since  . Anxiety   . Arthritis   . Cat allergies     Past Surgical History:  Procedure Laterality Date  . APPENDECTOMY    . CERVICAL CONIZATION W/BX    . CERVIX LESION DESTRUCTION  1990    Current Outpatient Prescriptions  Medication Sig Dispense Refill  . escitalopram (LEXAPRO) 5 MG tablet Take 1 tablet by mouth 2 (two) times  daily.  4  . montelukast (SINGULAIR) 10 MG tablet Take 10 mg by mouth as needed.   2  . VENTOLIN HFA 108 (90 Base) MCG/ACT inhaler Inhale 1 puff into the lungs as needed.     No current facility-administered medications for this visit.     Family History  Problem Relation Age of Onset  . Heart disease Maternal Grandmother   . Heart disease Maternal Grandfather   . Diabetes Maternal Grandfather   . Heart disease Paternal Grandmother   . Heart disease Paternal Grandfather   . Breast cancer Mother 43  . Breast cancer Sister 20  . Heart disease Father   . Stroke Father   . Heart attack Father     ROS:  Pertinent items are noted in HPI.  Otherwise, a comprehensive ROS was negative.  Exam:   BP 100/64 (BP Location: Right Arm, Patient Position: Sitting, Cuff Size: Normal)   Pulse 76   Resp 14   Ht 5' 9.5" (1.765 m)   Wt 159 lb 12.8 oz (72.5 kg)   LMP 02/15/2013 (Approximate)   BMI 23.26 kg/m     General appearance: alert, cooperative and appears stated age Head: Normocephalic, without obvious abnormality, atraumatic Neck: no adenopathy, supple, symmetrical, trachea midline and thyroid normal to inspection and palpation Lungs: clear to auscultation bilaterally Breasts: normal appearance, no masses or tenderness, No nipple retraction or dimpling,  No nipple discharge or bleeding, No axillary or supraclavicular adenopathy Heart: regular rate and rhythm Abdomen: soft, non-tender; no masses, no organomegaly Extremities: extremities normal, atraumatic, no cyanosis or edema Skin: Skin color, texture, turgor normal. No rashes or lesions Lymph nodes: Cervical, supraclavicular, and axillary nodes normal. No abnormal inguinal nodes palpated Neurologic: Grossly normal  Pelvic: External genitalia:  no lesions              Urethra:  normal appearing urethra with no masses, tenderness or lesions              Bartholins and Skenes: normal                 Vagina: normal appearing vagina with  normal color and discharge, no lesions              Cervix: no lesions              Pap taken: Yes.   Bimanual Exam:  Uterus:  normal size, contour, position, consistency, mobility, non-tender              Adnexa: no mass, fullness, tenderness              Rectal exam: Yes.  .  Confirms.              Anus:  normal sphincter tone, no lesions  Chaperone was present for exam.  Assessment:   Well woman visit with normal exam. Menopausal female. Symptomatic.  On Lexapro. FH of breast cancer in mother and sister.   Sister is BRCA negative.  Remote hx of cervical conization.  FH CAD.  Smoker.   Plan: Mammogram screening discussed. Recommended self breast awareness. Pap and HR HPV as above. Guidelines for Calcium, Vitamin D, regular exercise program including cardiovascular and weight bearing exercise. Routine labs including TSH, CMP, CBC, vit D, and full STD testing including Affirm.  Discussed smoking cessation.  Information on [facility] to patient.  Follow up annually and prn.       After visit summary provided.

## 2016-06-21 ENCOUNTER — Encounter: Payer: Self-pay | Admitting: Obstetrics and Gynecology

## 2016-06-21 ENCOUNTER — Ambulatory Visit (INDEPENDENT_AMBULATORY_CARE_PROVIDER_SITE_OTHER): Payer: BLUE CROSS/BLUE SHIELD | Admitting: Obstetrics and Gynecology

## 2016-06-21 VITALS — BP 100/64 | HR 76 | Resp 14 | Ht 69.5 in | Wt 159.8 lb

## 2016-06-21 DIAGNOSIS — Z01419 Encounter for gynecological examination (general) (routine) without abnormal findings: Secondary | ICD-10-CM

## 2016-06-21 LAB — COMPREHENSIVE METABOLIC PANEL
ALT: 14 U/L (ref 6–29)
AST: 20 U/L (ref 10–35)
Albumin: 4.2 g/dL (ref 3.6–5.1)
Alkaline Phosphatase: 69 U/L (ref 33–130)
BUN: 11 mg/dL (ref 7–25)
CALCIUM: 9 mg/dL (ref 8.6–10.4)
CO2: 26 mmol/L (ref 20–31)
Chloride: 104 mmol/L (ref 98–110)
Creat: 0.88 mg/dL (ref 0.50–1.05)
GLUCOSE: 84 mg/dL (ref 65–99)
POTASSIUM: 3.9 mmol/L (ref 3.5–5.3)
Sodium: 140 mmol/L (ref 135–146)
Total Bilirubin: 0.4 mg/dL (ref 0.2–1.2)
Total Protein: 7.3 g/dL (ref 6.1–8.1)

## 2016-06-21 LAB — CBC
HEMATOCRIT: 44.7 % (ref 35.0–45.0)
Hemoglobin: 14.8 g/dL (ref 11.7–15.5)
MCH: 29.6 pg (ref 27.0–33.0)
MCHC: 33.1 g/dL (ref 32.0–36.0)
MCV: 89.4 fL (ref 80.0–100.0)
MPV: 11.2 fL (ref 7.5–12.5)
Platelets: 205 10*3/uL (ref 140–400)
RBC: 5 MIL/uL (ref 3.80–5.10)
RDW: 14.2 % (ref 11.0–15.0)
WBC: 5.1 10*3/uL (ref 3.8–10.8)

## 2016-06-21 LAB — TSH: TSH: 0.99 mIU/L

## 2016-06-21 NOTE — Patient Instructions (Signed)

## 2016-06-22 LAB — STD PANEL
HEP B S AG: NEGATIVE
HIV: NONREACTIVE

## 2016-06-22 LAB — HEPATITIS C ANTIBODY: HCV AB: NEGATIVE

## 2016-06-22 LAB — WET PREP BY MOLECULAR PROBE
CANDIDA SPECIES: NOT DETECTED
Gardnerella vaginalis: NOT DETECTED
TRICHOMONAS VAG: NOT DETECTED

## 2016-06-22 LAB — VITAMIN D 25 HYDROXY (VIT D DEFICIENCY, FRACTURES): Vit D, 25-Hydroxy: 58 ng/mL (ref 30–100)

## 2016-06-23 LAB — IPS N GONORRHOEA AND CHLAMYDIA BY PCR

## 2016-06-23 LAB — IPS PAP TEST WITH HPV

## 2016-09-30 ENCOUNTER — Other Ambulatory Visit: Payer: Self-pay | Admitting: Gastroenterology

## 2016-09-30 DIAGNOSIS — R1084 Generalized abdominal pain: Secondary | ICD-10-CM

## 2016-10-04 ENCOUNTER — Ambulatory Visit
Admission: RE | Admit: 2016-10-04 | Discharge: 2016-10-04 | Disposition: A | Discharge status: Home / Self Care | Payer: BLUE CROSS/BLUE SHIELD | Source: Ambulatory Visit | Attending: Gastroenterology | Admitting: Gastroenterology

## 2016-10-04 ENCOUNTER — Other Ambulatory Visit: Payer: BLUE CROSS/BLUE SHIELD

## 2016-10-04 DIAGNOSIS — R1084 Generalized abdominal pain: Secondary | ICD-10-CM

## 2016-10-04 MED ORDER — IOPAMIDOL (ISOVUE-300) INJECTION 61%
100.0000 mL | Freq: Once | INTRAVENOUS | Status: AC | PRN
  Administered 2016-10-04: 100 mL via INTRAVENOUS

## 2017-01-30 ENCOUNTER — Encounter (HOSPITAL_BASED_OUTPATIENT_CLINIC_OR_DEPARTMENT_OTHER): Payer: Self-pay | Admitting: *Deleted

## 2017-01-30 ENCOUNTER — Emergency Department (HOSPITAL_BASED_OUTPATIENT_CLINIC_OR_DEPARTMENT_OTHER): Payer: BLUE CROSS/BLUE SHIELD

## 2017-01-30 ENCOUNTER — Inpatient Hospital Stay (HOSPITAL_BASED_OUTPATIENT_CLINIC_OR_DEPARTMENT_OTHER)
Admission: EM | Admit: 2017-01-30 | Discharge: 2017-02-02 | DRG: 871 | Disposition: A | Discharge status: Home / Self Care | Payer: BLUE CROSS/BLUE SHIELD | Attending: Internal Medicine | Admitting: Internal Medicine

## 2017-01-30 ENCOUNTER — Other Ambulatory Visit: Payer: Self-pay

## 2017-01-30 DIAGNOSIS — J40 Bronchitis, not specified as acute or chronic: Secondary | ICD-10-CM | POA: Diagnosis present

## 2017-01-30 DIAGNOSIS — F418 Other specified anxiety disorders: Secondary | ICD-10-CM | POA: Diagnosis present

## 2017-01-30 DIAGNOSIS — J45901 Unspecified asthma with (acute) exacerbation: Secondary | ICD-10-CM | POA: Diagnosis present

## 2017-01-30 DIAGNOSIS — Z87891 Personal history of nicotine dependence: Secondary | ICD-10-CM

## 2017-01-30 DIAGNOSIS — J4551 Severe persistent asthma with (acute) exacerbation: Secondary | ICD-10-CM | POA: Diagnosis present

## 2017-01-30 DIAGNOSIS — A419 Sepsis, unspecified organism: Secondary | ICD-10-CM | POA: Diagnosis not present

## 2017-01-30 DIAGNOSIS — J111 Influenza due to unidentified influenza virus with other respiratory manifestations: Secondary | ICD-10-CM | POA: Diagnosis present

## 2017-01-30 DIAGNOSIS — F419 Anxiety disorder, unspecified: Secondary | ICD-10-CM | POA: Diagnosis not present

## 2017-01-30 DIAGNOSIS — J9601 Acute respiratory failure with hypoxia: Secondary | ICD-10-CM | POA: Diagnosis present

## 2017-01-30 DIAGNOSIS — Z79899 Other long term (current) drug therapy: Secondary | ICD-10-CM

## 2017-01-30 DIAGNOSIS — R0602 Shortness of breath: Secondary | ICD-10-CM

## 2017-01-30 HISTORY — DX: Unspecified asthma, uncomplicated: J45.909

## 2017-01-30 LAB — BASIC METABOLIC PANEL
ANION GAP: 8 (ref 5–15)
BUN: 9 mg/dL (ref 6–20)
CALCIUM: 9.3 mg/dL (ref 8.9–10.3)
CO2: 25 mmol/L (ref 22–32)
CREATININE: 0.77 mg/dL (ref 0.44–1.00)
Chloride: 104 mmol/L (ref 101–111)
GLUCOSE: 164 mg/dL — AB (ref 65–99)
Potassium: 3.7 mmol/L (ref 3.5–5.1)
Sodium: 137 mmol/L (ref 135–145)

## 2017-01-30 LAB — CBC WITH DIFFERENTIAL/PLATELET
BASOS PCT: 0 %
Basophils Absolute: 0 10*3/uL (ref 0.0–0.1)
Eosinophils Absolute: 0 10*3/uL (ref 0.0–0.7)
Eosinophils Relative: 0 %
HEMATOCRIT: 44.7 % (ref 36.0–46.0)
HEMOGLOBIN: 15 g/dL (ref 12.0–15.0)
LYMPHS ABS: 0.8 10*3/uL (ref 0.7–4.0)
Lymphocytes Relative: 7 %
MCH: 29.4 pg (ref 26.0–34.0)
MCHC: 33.6 g/dL (ref 30.0–36.0)
MCV: 87.5 fL (ref 78.0–100.0)
MONOS PCT: 6 %
Monocytes Absolute: 0.7 10*3/uL (ref 0.1–1.0)
NEUTROS ABS: 9.4 10*3/uL — AB (ref 1.7–7.7)
NEUTROS PCT: 87 %
Platelets: 179 10*3/uL (ref 150–400)
RBC: 5.11 MIL/uL (ref 3.87–5.11)
RDW: 13.8 % (ref 11.5–15.5)
WBC: 10.9 10*3/uL — ABNORMAL HIGH (ref 4.0–10.5)

## 2017-01-30 LAB — LACTIC ACID, PLASMA: Lactic Acid, Venous: 5.5 mmol/L (ref 0.5–1.9)

## 2017-01-30 LAB — I-STAT ARTERIAL BLOOD GAS, ED
ACID-BASE DEFICIT: 5 mmol/L — AB (ref 0.0–2.0)
BICARBONATE: 19.8 mmol/L — AB (ref 20.0–28.0)
O2 Saturation: 100 %
TCO2: 21 mmol/L — AB (ref 22–32)
pCO2 arterial: 36.7 mmHg (ref 32.0–48.0)
pH, Arterial: 7.339 — ABNORMAL LOW (ref 7.350–7.450)
pO2, Arterial: 290 mmHg — ABNORMAL HIGH (ref 83.0–108.0)

## 2017-01-30 LAB — MRSA PCR SCREENING: MRSA by PCR: NEGATIVE

## 2017-01-30 LAB — PROCALCITONIN: PROCALCITONIN: 1.26 ng/mL

## 2017-01-30 LAB — GLUCOSE, CAPILLARY: GLUCOSE-CAPILLARY: 224 mg/dL — AB (ref 65–99)

## 2017-01-30 MED ORDER — ENOXAPARIN SODIUM 40 MG/0.4ML ~~LOC~~ SOLN
40.0000 mg | SUBCUTANEOUS | Status: DC
  Administered 2017-01-30 – 2017-02-01 (×3): 40 mg via SUBCUTANEOUS
  Filled 2017-01-30 (×3): qty 0.4

## 2017-01-30 MED ORDER — ESCITALOPRAM OXALATE 5 MG PO TABS
5.0000 mg | ORAL_TABLET | Freq: Two times a day (BID) | ORAL | Status: DC
  Filled 2017-01-30: qty 1

## 2017-01-30 MED ORDER — METHYLPREDNISOLONE SODIUM SUCC 125 MG IJ SOLR
60.0000 mg | Freq: Three times a day (TID) | INTRAMUSCULAR | Status: DC
  Administered 2017-01-30 – 2017-02-01 (×5): 60 mg via INTRAVENOUS
  Filled 2017-01-30 (×5): qty 2

## 2017-01-30 MED ORDER — LEVALBUTEROL HCL 1.25 MG/0.5ML IN NEBU
1.2500 mg | INHALATION_SOLUTION | Freq: Four times a day (QID) | RESPIRATORY_TRACT | Status: DC
  Administered 2017-01-30 – 2017-01-31 (×5): 1.25 mg via RESPIRATORY_TRACT
  Filled 2017-01-30 (×5): qty 0.5

## 2017-01-30 MED ORDER — LEVALBUTEROL HCL 1.25 MG/0.5ML IN NEBU: 1.2500 mg | INHALATION_SOLUTION | Freq: Four times a day (QID) | RESPIRATORY_TRACT | Status: DC

## 2017-01-30 MED ORDER — ZOLPIDEM TARTRATE 5 MG PO TABS
5.0000 mg | ORAL_TABLET | Freq: Every evening | ORAL | Status: DC | PRN
  Administered 2017-01-31 – 2017-02-01 (×2): 5 mg via ORAL
  Filled 2017-01-30 (×2): qty 1

## 2017-01-30 MED ORDER — SODIUM CHLORIDE 0.9 % IV BOLUS (SEPSIS)
2000.0000 mL | Freq: Once | INTRAVENOUS | Status: AC
  Administered 2017-01-30: 2000 mL via INTRAVENOUS

## 2017-01-30 MED ORDER — ALPRAZOLAM 0.25 MG PO TABS
0.2500 mg | ORAL_TABLET | Freq: Three times a day (TID) | ORAL | Status: DC | PRN
  Administered 2017-01-30: 0.25 mg via ORAL
  Filled 2017-01-30 (×2): qty 1

## 2017-01-30 MED ORDER — SODIUM CHLORIDE 0.9 % IV BOLUS (SEPSIS)
500.0000 mL | Freq: Once | INTRAVENOUS | Status: AC
  Administered 2017-01-31: 500 mL via INTRAVENOUS

## 2017-01-30 MED ORDER — ALBUTEROL (5 MG/ML) CONTINUOUS INHALATION SOLN
15.0000 mg/h | INHALATION_SOLUTION | RESPIRATORY_TRACT | Status: DC
  Administered 2017-01-30: 11:00:00 via RESPIRATORY_TRACT

## 2017-01-30 MED ORDER — IPRATROPIUM BROMIDE 0.02 % IN SOLN
0.5000 mg | Freq: Once | RESPIRATORY_TRACT | Status: AC
  Administered 2017-01-30: 0.5 mg via RESPIRATORY_TRACT

## 2017-01-30 MED ORDER — SODIUM CHLORIDE 0.9 % IV SOLN
INTRAVENOUS | Status: DC
  Administered 2017-01-30: 19:00:00 via INTRAVENOUS

## 2017-01-30 MED ORDER — LORAZEPAM 2 MG/ML IJ SOLN
0.5000 mg | Freq: Once | INTRAMUSCULAR | Status: AC
  Administered 2017-01-30: 0.5 mg via INTRAVENOUS
  Filled 2017-01-30: qty 1

## 2017-01-30 MED ORDER — MONTELUKAST SODIUM 10 MG PO TABS
10.0000 mg | ORAL_TABLET | Freq: Every day | ORAL | Status: DC
  Administered 2017-01-31 – 2017-02-01 (×2): 10 mg via ORAL
  Filled 2017-01-30 (×2): qty 1

## 2017-01-30 MED ORDER — ALBUTEROL (5 MG/ML) CONTINUOUS INHALATION SOLN
INHALATION_SOLUTION | RESPIRATORY_TRACT | Status: AC
  Filled 2017-01-30: qty 20

## 2017-01-30 MED ORDER — ACETAMINOPHEN 325 MG PO TABS
650.0000 mg | ORAL_TABLET | Freq: Four times a day (QID) | ORAL | Status: DC | PRN
  Administered 2017-02-01: 650 mg via ORAL
  Filled 2017-01-30: qty 2

## 2017-01-30 MED ORDER — IPRATROPIUM BROMIDE 0.02 % IN SOLN
0.5000 mg | Freq: Four times a day (QID) | RESPIRATORY_TRACT | Status: DC
  Administered 2017-01-30 – 2017-01-31 (×5): 0.5 mg via RESPIRATORY_TRACT
  Filled 2017-01-30 (×5): qty 2.5

## 2017-01-30 MED ORDER — MAGNESIUM SULFATE 2 GM/50ML IV SOLN
2.0000 g | Freq: Once | INTRAVENOUS | Status: AC
  Administered 2017-01-30: 2 g via INTRAVENOUS
  Filled 2017-01-30: qty 50

## 2017-01-30 MED ORDER — METHYLPREDNISOLONE SODIUM SUCC 125 MG IJ SOLR
125.0000 mg | Freq: Once | INTRAMUSCULAR | Status: AC
  Administered 2017-01-30: 125 mg via INTRAVENOUS
  Filled 2017-01-30: qty 2

## 2017-01-30 MED ORDER — HYDRALAZINE HCL 20 MG/ML IJ SOLN: 5.0000 mg | INTRAMUSCULAR | Status: DC | PRN

## 2017-01-30 MED ORDER — IPRATROPIUM BROMIDE 0.02 % IN SOLN
0.5000 mg | Freq: Once | RESPIRATORY_TRACT | Status: AC
  Administered 2017-01-30: 0.5 mg via RESPIRATORY_TRACT
  Filled 2017-01-30: qty 2.5

## 2017-01-30 MED ORDER — ALBUTEROL (5 MG/ML) CONTINUOUS INHALATION SOLN
15.0000 mg/h | INHALATION_SOLUTION | Freq: Once | RESPIRATORY_TRACT | Status: AC
  Administered 2017-01-30: 15 mg/h via RESPIRATORY_TRACT

## 2017-01-30 MED ORDER — ESCITALOPRAM OXALATE 10 MG PO TABS
10.0000 mg | ORAL_TABLET | Freq: Every day | ORAL | Status: DC
  Administered 2017-01-31 – 2017-02-02 (×3): 10 mg via ORAL
  Filled 2017-01-30 (×3): qty 1

## 2017-01-30 MED ORDER — LORAZEPAM 2 MG/ML IJ SOLN
1.0000 mg | Freq: Once | INTRAMUSCULAR | Status: AC
  Administered 2017-01-30: 1 mg via INTRAVENOUS
  Filled 2017-01-30: qty 1

## 2017-01-30 MED ORDER — SODIUM CHLORIDE 0.9 % IV SOLN
INTRAVENOUS | Status: AC
  Administered 2017-01-30: 15:00:00 via INTRAVENOUS

## 2017-01-30 MED ORDER — ORAL CARE MOUTH RINSE
15.0000 mL | Freq: Two times a day (BID) | OROMUCOSAL | Status: DC
  Administered 2017-01-31 – 2017-02-02 (×4): 15 mL via OROMUCOSAL

## 2017-01-30 MED ORDER — IPRATROPIUM BROMIDE 0.02 % IN SOLN: 0.5000 mg | RESPIRATORY_TRACT | Status: DC

## 2017-01-30 MED ORDER — ONDANSETRON HCL 4 MG/2ML IJ SOLN: 4.0000 mg | Freq: Three times a day (TID) | INTRAMUSCULAR | Status: DC | PRN

## 2017-01-30 MED ORDER — DM-GUAIFENESIN ER 30-600 MG PO TB12
1.0000 | ORAL_TABLET | Freq: Two times a day (BID) | ORAL | Status: DC
  Administered 2017-01-30: 1 via ORAL
  Filled 2017-01-30: qty 1

## 2017-01-30 MED ORDER — IPRATROPIUM BROMIDE 0.02 % IN SOLN
RESPIRATORY_TRACT | Status: AC
  Administered 2017-01-30: 0.5 mg via RESPIRATORY_TRACT
  Filled 2017-01-30: qty 2.5

## 2017-01-30 MED ORDER — OSELTAMIVIR PHOSPHATE 75 MG PO CAPS
75.0000 mg | ORAL_CAPSULE | Freq: Two times a day (BID) | ORAL | Status: DC
  Administered 2017-01-30 – 2017-02-02 (×6): 75 mg via ORAL
  Filled 2017-01-30 (×8): qty 1

## 2017-01-30 NOTE — ED Notes (Signed)
Pt on cardiac monitor and continuous pulse ox 

## 2017-01-30 NOTE — ED Provider Notes (Addendum)
[facility] EMERGENCY DEPARTMENT Provider Note   CSN: [id number] Arrival date & time: 01/30/17  1028     History   Chief Complaint Chief Complaint  Patient presents with  . Influenza    HPI Rachael Peters is a 56 y.o. female.  Patient is a 56 year old female with a history of asthma presenting today with shortness of breath.  Patient states on Thursday night into Friday morning she started having general malaise, fever, cough and body aches.  She had some mild shortness of breath and wheezing that got worse yesterday.  She was seen by her doctor and tested positive for the flu.  At the office she was given a breathing treatment and prednisone.  Prior to leaving her oxygen never got greater than 92% on room air but she did feel better.  She has taken Tamiflu and prednisone at home however throughout the night her breathing became worse.  She had worsening wheezing, shortness of breath despite using inhalers.  She denies fever today and states her biggest issue is the wheezing and shortness of breath.  She has had a nonproductive cough.  She denies any swelling or rashes.   The history is provided by the patient.    Past Medical History:  Diagnosis Date  . Abnormal Pap smear of cervix 1990   hx cervical conization for abnormal pap--paps normal since  . Anxiety   . Arthritis   . Asthma   . Cat allergies     There are no active problems to display for this patient.   Past Surgical History:  Procedure Laterality Date  . APPENDECTOMY    . CERVICAL CONIZATION W/BX    . CERVIX LESION DESTRUCTION  1990    OB History    Gravida Para Term Preterm AB Living   2 1   1    0   SAB TAB Ectopic Multiple Live Births                   Home Medications    Prior to Admission medications   Medication Sig Start Date End Date Taking? Authorizing Provider  ALPRAZolam (XANAX PO) Take by mouth.   Yes [provider]  escitalopram (LEXAPRO) 5 MG tablet Take 1  tablet by mouth 2 (two) times daily. 04/04/15  Yes [provider]  montelukast (SINGULAIR) 10 MG tablet Take 10 mg by mouth as needed.  03/21/15  Yes [provider]  Nutritional Supplements (ESTROVEN PM PO) Take by mouth.   Yes [provider]  VENTOLIN HFA 108 (90 Base) MCG/ACT inhaler Inhale 1 puff into the lungs as needed. 06/12/16  Yes [provider]    Family History Family History  Problem Relation Age of Onset  . Heart disease Maternal Grandmother   . Heart disease Maternal Grandfather   . Diabetes Maternal Grandfather   . Heart disease Paternal Grandmother   . Heart disease Paternal Grandfather   . Breast cancer Mother 106  . Breast cancer Sister 42  . Heart disease Father   . Stroke Father   . Heart attack Father     Social History Social History   Tobacco Use  . Smoking status: Former Smoker    Packs/day: 0.50    Types: Cigarettes    Start date: 11/16/2015  . Smokeless tobacco: Never Used  Substance Use Topics  . Alcohol use: Yes    Alcohol/week: 1.2 oz    Types: 2 Glasses of wine per week  Comment: occ  . Drug use: No     Allergies   Patient has no known allergies.   Review of Systems Review of Systems  All other systems reviewed and are negative.    Physical Exam Updated Vital Signs BP 133/80 (BP Location: Left Arm)   Pulse 86   Temp 97.9 F (36.6 C) (Oral)   Resp (!) 22   Ht 5\' 10"  (1.778 m)   Wt 70.3 kg (155 lb)   LMP 02/15/2013 (Approximate)   SpO2 91%   BMI 22.24 kg/m   Physical Exam  Constitutional: She is oriented to person, place, and time. She appears well-developed and well-nourished. No distress.  HENT:  Head: Normocephalic and atraumatic.  Mouth/Throat: Oropharynx is clear and moist.  Eyes: Conjunctivae and EOM are normal. Pupils are equal, round, and reactive to light.  Neck: Normal range of motion. Neck supple.  Cardiovascular: Normal rate, regular rhythm and intact distal pulses.  No  murmur heard. Pulmonary/Chest: Accessory muscle usage present. Tachypnea noted. No respiratory distress. She has wheezes. She has no rales.  Abdominal: Soft. She exhibits no distension. There is no tenderness. There is no rebound and no guarding.  Musculoskeletal: Normal range of motion. She exhibits no edema or tenderness.  Neurological: She is alert and oriented to person, place, and time.  Skin: Skin is warm and dry. No rash noted. No erythema.  Psychiatric: She has a normal mood and affect. Her behavior is normal.  Nursing note and vitals reviewed.    ED Treatments / Results  Labs (all labs ordered are listed, but only abnormal results are displayed) Labs Reviewed  CBC WITH DIFFERENTIAL/PLATELET - Abnormal; Notable for the following components:      Result Value   WBC 10.9 (*)    Neutro Abs 9.4 (*)    All other components within normal limits  BASIC METABOLIC PANEL - Abnormal; Notable for the following components:   Glucose, Bld 164 (*)    All other components within normal limits    EKG  EKG Interpretation None       Radiology Dg Chest Port 1 View  Result Date: 01/30/2017 CLINICAL DATA:  Shortness of breath and chest pressure. Positive test for flu. History of asthma. EXAM: PORTABLE CHEST 1 VIEW COMPARISON:  03/15/2015 FINDINGS: Cardiomediastinal silhouette is normal. Mediastinal contours appear intact. There is no evidence of focal airspace consolidation, pleural effusion or pneumothorax. Hyperinflation of the lungs. Osseous structures are without acute abnormality. Soft tissues are grossly normal. IMPRESSION: Hyperinflation of the lungs without evidence of peribronchial thickening or lobar airspace consolidation. Electronically Signed   By: Fidela Salisbury M.D.   On: 01/30/2017 11:41    Procedures Procedures (including critical care time)  Medications Ordered in ED Medications  albuterol (PROVENTIL,VENTOLIN) solution continuous neb (0 mg/hr Nebulization Stopped  01/30/17 1154)  ipratropium (ATROVENT) nebulizer solution 0.5 mg (0.5 mg Nebulization Given 01/30/17 1049)  methylPREDNISolone sodium succinate (SOLU-MEDROL) 125 mg/2 mL injection 125 mg (125 mg Intravenous Given 01/30/17 1114)  magnesium sulfate IVPB 2 g 50 mL (0 g Intravenous Stopped 01/30/17 1141)  LORazepam (ATIVAN) injection 1 mg (1 mg Intravenous Given 01/30/17 1159)  ipratropium (ATROVENT) nebulizer solution 0.5 mg (0.5 mg Nebulization Given 01/30/17 1227)  albuterol (PROVENTIL,VENTOLIN) solution continuous neb (15 mg/hr Nebulization Given 01/30/17 1227)     Initial Impression / Assessment and Plan / ED Course  I have reviewed the triage vital signs and the nursing notes.  Pertinent labs & imaging results that were available during  my care of the patient were reviewed by me and considered in my medical decision making (see chart for details).     Patient starting off with flulike symptoms several days ago tested positive for the flu is currently on Tamiflu but also now appears to have an asthma exacerbation.  She did take 1 dose of prednisone yesterday and 1 today in addition to using inhalers at home but symptoms are not improving.  Patient is displaying signs of respiratory distress upon arrival here.  Respiratory rate is 22, accessory muscle use, diffuse wheezing and oxygen saturation of 91% on room air.  Patient was started on an hour-long continuous neb with Atrovent as well, given IV Solu-Medrol and magnesium.  CBC, BMP and chest x-ray pending.  11:56 AM Labs are reassuring.  X-ray is negative for infection.  After our long continuous albuterol neb with Atrovent patient says she feels slightly better but she is still tachycardic, tachypneic and audibly wheezing.  This is after receiving magnesium and Solu-Medrol.  Patient is resistant to be admitted to the hospital we will do a second hour-long neb and reevaluate.  2:05 PM After second hour-long neb patient is still wheezing and  tachypneic.  It is slightly improved but patient is still short of breath and requiring 4 L of oxygen to maintain sats in the mid 90s.  Because now requiring O2 and pt states she is feeling tired will try bipap to see if improves her condition  CRITICAL CARE Performed by: Othar Curto Total critical care time: 30 minutes Critical care time was exclusive of separately billable procedures and treating other patients. Critical care was necessary to treat or prevent imminent or life-threatening deterioration. Critical care was time spent personally by me on the following activities: development of treatment plan with patient and/or surrogate as well as nursing, discussions with consultants, evaluation of patient's response to treatment, examination of patient, obtaining history from patient or surrogate, ordering and performing treatments and interventions, ordering and review of laboratory studies, ordering and review of radiographic studies, pulse oximetry and re-evaluation of patient's condition.   Final Clinical Impressions(s) / ED Diagnoses   Final diagnoses:  Influenza  Severe persistent asthma with exacerbation    ED Discharge Orders    None       Blanchie Dessert, MD 01/30/17 1407    Blanchie Dessert, MD 01/30/17 1414

## 2017-01-30 NOTE — ED Notes (Signed)
Pt completed another 15mg  Albuterol CAT. Pt still has audible exp wheezing noted. Pt states "im getting tired." Noticed increased WOB, accesory muslces usage and increased in oxygenation to have Spo2 greater than 92%. Spo2 on 4LPM  now 92.

## 2017-01-30 NOTE — ED Notes (Addendum)
Respiratory at bedside for second neb treatment, aware of low pulse ox while on room air.

## 2017-01-30 NOTE — H&P (Signed)
History and Physical    KEIDY THURGOOD QIH:[id number] DOB: November 28, 1960 DOA: 01/30/2017  Referring MD/NP/PA:   PCP: Leonard Downing, MD   Patient coming from:  The patient is coming from home.  At baseline, pt is independent for most of ADL.   Chief Complaint: fever, body ache, cough and SOB  HPI: Rachael Peters is a 56 y.o. female with medical history significant of asthma, depression, anxiety, who presents with fever, body ache, cough and SOB.  Patient states that her symptoms started since Thursday. She has been having fever, chills, body ache, cough and SOB which has been progressively getting worse. She coughs up yellow colored mucus. Patient has chest tightness, no chest pain. She does not have a runny nose or sore throat.She has nausea and vomited last night, but no diarrhea or abdominal pain. Patient denies symptoms of UTI or unilateral weakness. Pt was seen at [facility] and had positive Flu test (she is not sure which type of Flu). She was given Tamiflu which she has taken 3 dose so far. She continues to have worsening SOB and wheezing.  ED Course: pt was found to have WBC 10.9,electrolytes renal function okay, temperature normal, tachycardia, tachypnea, oxygen saturation 85% on room air, ABG with pH of 7.339, PCO2 36.7, PO2 290. chest x-rays negative for infiltration. Patient was started with a BiPAP. Patient is admitted to stepdown as inpatient.  Review of Systems:   General: has fevers, chills, no body weight gain, has poor appetite, has fatigue HEENT: no blurry vision, hearing changes or sore throat Respiratory: has dyspnea, coughing, wheezing CV: no chest pain, no palpitations GI: has nausea, vomiting, no abdominal pain, diarrhea, constipation GU: no dysuria, burning on urination, increased urinary frequency, hematuria  Ext: no leg edema Neuro: no unilateral weakness, numbness, or tingling, no vision change or hearing loss Skin: no rash, no skin tear. MSK: No muscle  spasm, no deformity, no limitation of range of movement in spin Heme: No easy bruising.  Travel history: No recent long distant travel.  Allergy: No Known Allergies  Past Medical History:  Diagnosis Date  . Abnormal Pap smear of cervix 1990   hx cervical conization for abnormal pap--paps normal since  . Anxiety   . Arthritis   . Asthma   . Cat allergies     Past Surgical History:  Procedure Laterality Date  . APPENDECTOMY    . CERVICAL CONIZATION W/BX    . CERVIX LESION DESTRUCTION  1990    Social History:  reports that she has quit smoking. Her smoking use included cigarettes. She started smoking about 14 months ago. She smoked 0.50 packs per day. she has never used smokeless tobacco. She reports that she drinks about 1.2 oz of alcohol per week. She reports that she does not use drugs.  Family History:  Family History  Problem Relation Age of Onset  . Heart disease Maternal Grandmother   . Heart disease Maternal Grandfather   . Diabetes Maternal Grandfather   . Heart disease Paternal Grandmother   . Heart disease Paternal Grandfather   . Breast cancer Mother 21  . Breast cancer Sister 43  . Heart disease Father   . Stroke Father   . Heart attack Father      Prior to Admission medications   Medication Sig Start Date End Date Taking? Authorizing Provider  ALPRAZolam (XANAX) 0.25 MG tablet Take 0.25 mg by mouth every 8 (eight) hours as needed (anxiety).    Yes [provider]  escitalopram (LEXAPRO) 5 MG tablet Take 1 tablet by mouth 2 (two) times daily. 04/04/15  Yes [provider]  montelukast (SINGULAIR) 10 MG tablet Take 10 mg by mouth as needed.  03/21/15  Yes [provider]  Nutritional Supplements (ESTROVEN PM PO) Take by mouth.   Yes [provider]  VENTOLIN HFA 108 (90 Base) MCG/ACT inhaler Inhale 1 puff into the lungs as needed. 06/12/16  Yes [provider]    Physical Exam: Vitals:   01/30/17 1515 01/30/17 1600  01/30/17 1822 01/30/17 1956  BP: 134/80 (!) 111/92 (!) 157/78   Pulse: (!) 117 (!) 112 (!) 104   Resp: 15 (!) 26 (!) 31   Temp:    98.6 F (37 C)  TempSrc:    Axillary  SpO2: 94% 94% 98%   Weight:   72 kg (158 lb 11.7 oz)   Height:   5\' 10"  (1.778 m)    General: Not in acute distress HEENT:       Eyes: PERRL, EOMI, no scleral icterus.       ENT: No discharge from the ears and nose, no pharynx injection, no tonsillar enlargement.        Neck: No JVD, no bruit, no mass felt. Heme: No neck lymph node enlargement. Cardiac: S1/S2, RRR, No murmurs, No gallops or rubs. Respiratory: Patient has wheezing and coarse breathing sound bilaterally. GI: Soft, nondistended, nontender, no rebound pain, no organomegaly, BS present. GU: No hematuria Ext: No pitting leg edema bilaterally. 2+DP/PT pulse bilaterally. Musculoskeletal: No joint deformities, No joint redness or warmth, no limitation of ROM in spin. Skin: No rashes.  Neuro: Alert, oriented X3, cranial nerves II-XII grossly intact, moves all extremities normally.  Psych: Patient is not psychotic, no suicidal or hemocidal ideation.  Labs on Admission: I have personally reviewed following labs and imaging studies  CBC: Recent Labs  Lab 01/30/17 1059  WBC 10.9*  NEUTROABS 9.4*  HGB 15.0  HCT 44.7  MCV 87.5  PLT 867   Basic Metabolic Panel: Recent Labs  Lab 01/30/17 1059  NA 137  K 3.7  CL 104  CO2 25  GLUCOSE 164*  BUN 9  CREATININE 0.77  CALCIUM 9.3   GFR: Estimated Creatinine Clearance: 86.9 mL/min (by C-G formula based on SCr of 0.77 mg/dL). Liver Function Tests: No results for input(s): AST, ALT, ALKPHOS, BILITOT, PROT, ALBUMIN in the last 168 hours. No results for input(s): LIPASE, AMYLASE in the last 168 hours. No results for input(s): AMMONIA in the last 168 hours. Coagulation Profile: No results for input(s): INR, PROTIME in the last 168 hours. Cardiac Enzymes: No results for input(s): CKTOTAL, CKMB,  CKMBINDEX, TROPONINI in the last 168 hours. BNP (last 3 results) No results for input(s): PROBNP in the last 8760 hours. HbA1C: No results for input(s): HGBA1C in the last 72 hours. CBG: No results for input(s): GLUCAP in the last 168 hours. Lipid Profile: No results for input(s): CHOL, HDL, LDLCALC, TRIG, CHOLHDL, LDLDIRECT in the last 72 hours. Thyroid Function Tests: No results for input(s): TSH, T4TOTAL, FREET4, T3FREE, THYROIDAB in the last 72 hours. Anemia Panel: No results for input(s): VITAMINB12, FOLATE, FERRITIN, TIBC, IRON, RETICCTPCT in the last 72 hours. Urine analysis: No results found for: COLORURINE, APPEARANCEUR, LABSPEC, PHURINE, GLUCOSEU, HGBUR, BILIRUBINUR, KETONESUR, PROTEINUR, UROBILINOGEN, NITRITE, LEUKOCYTESUR Sepsis Labs: @LABRCNTIP (procalcitonin:4,lacticidven:4) )No results found for this or any previous visit (from the past 240 hour(s)).   Radiological Exams on Admission: Dg Chest Port 1 View  Result Date:  01/30/2017 CLINICAL DATA:  Shortness of breath and chest pressure. Positive test for flu. History of asthma. EXAM: PORTABLE CHEST 1 VIEW COMPARISON:  03/15/2015 FINDINGS: Cardiomediastinal silhouette is normal. Mediastinal contours appear intact. There is no evidence of focal airspace consolidation, pleural effusion or pneumothorax. Hyperinflation of the lungs. Osseous structures are without acute abnormality. Soft tissues are grossly normal. IMPRESSION: Hyperinflation of the lungs without evidence of peribronchial thickening or lobar airspace consolidation. Electronically Signed   By: Fidela Salisbury M.D.   On: 01/30/2017 11:41     EKG: Independently reviewed. Sinus rhythm, QTC 451, poor R-wave progression, nonspecific T-wave change.   Assessment/Plan Active Problems:   Asthma exacerbation   Sepsis ([place])   Influenza   Anxiety   Depression with anxiety   Acute respiratory failure with hypoxia (HCC)  Acute respiratory failure with hypoxia due to  asthma exacerbation secondary to Flu and sepsis: No infiltration on chest x-ray. Patient meets criteria for sepsis with tachycardia, tachypnea, andl eukocytosis. Currently hemodynamically stable. Pending lactic acid.  -will admit to SDU as inpt -continue tamiflu -on BiPAP -Nebulizers: Scheduled Atrovent and prn Xopenex nebs -Solu-Medrol 60 mg IV q8h  -continue Singulair -Mucinex for cough  -Urine S. pneumococcal antigen -Follow up blood culture x2, sputum culture -will get Procalcitonin and trend lactic acid levels per sepsis protocol. -IVF: 2L of NS bolus in ED, followed by 125 cc/h   Depression and anxiety: Stable, no suicidal or homicidal ideations. -Continue home medications: Lexapro and when necessary Xanax   DVT ppx: SQ Lovenox Code Status: Full code Family Communication: None at bed side.    Disposition Plan:  Anticipate discharge back to previous home environment Consults called:  none Admission status:  SDU/inpation       Date of Service 01/30/2017    Ivor Costa [facility] Pager [phone]  If 7PM-7AM, please contact night-coverage [email or web address] Password [facility] 01/30/2017, 8:24 PM

## 2017-01-30 NOTE — ED Notes (Signed)
Right Radial Artery x 1 attempt for ABG. No complications noted. Bleeding stopped.

## 2017-01-30 NOTE — ED Triage Notes (Signed)
Pt reports being seen at [facility] yesterday and flu swab was positive. Reports taking Tamiflu and Prednisone -- sob worsened since then with associated chest pressure. Reports fever yesterday of 101 with emesis x1 last night.

## 2017-01-30 NOTE — ED Triage Notes (Signed)
RT at bedside initiating breathing tx.

## 2017-01-30 NOTE — ED Notes (Addendum)
Pt O2 sats noted to be 85% on room air. Pt placed on O2 via nasal cannula. EDP made aware.

## 2017-01-30 NOTE — ED Notes (Signed)
Report to [facility], RN at [facility].

## 2017-01-30 NOTE — ED Notes (Signed)
ED Provider at bedside. 

## 2017-01-31 ENCOUNTER — Inpatient Hospital Stay (HOSPITAL_COMMUNITY): Payer: BLUE CROSS/BLUE SHIELD

## 2017-01-31 DIAGNOSIS — J4551 Severe persistent asthma with (acute) exacerbation: Secondary | ICD-10-CM

## 2017-01-31 DIAGNOSIS — J40 Bronchitis, not specified as acute or chronic: Secondary | ICD-10-CM

## 2017-01-31 LAB — CBC WITH DIFFERENTIAL/PLATELET
Basophils Absolute: 0 K/uL (ref 0.0–0.1)
Basophils Relative: 0 %
Eosinophils Absolute: 0 K/uL (ref 0.0–0.7)
Eosinophils Relative: 0 %
HCT: 36.3 % (ref 36.0–46.0)
Hemoglobin: 12 g/dL (ref 12.0–15.0)
Lymphocytes Relative: 5 %
Lymphs Abs: 0.8 K/uL (ref 0.7–4.0)
MCH: 29.6 pg (ref 26.0–34.0)
MCHC: 33.1 g/dL (ref 30.0–36.0)
MCV: 89.6 fL (ref 78.0–100.0)
Monocytes Absolute: 1.2 K/uL — ABNORMAL HIGH (ref 0.1–1.0)
Monocytes Relative: 7 %
Neutro Abs: 14.4 K/uL — ABNORMAL HIGH (ref 1.7–7.7)
Neutrophils Relative %: 88 %
Platelets: 171 K/uL (ref 150–400)
RBC: 4.05 MIL/uL (ref 3.87–5.11)
RDW: 14.4 % (ref 11.5–15.5)
WBC: 16.4 K/uL — ABNORMAL HIGH (ref 4.0–10.5)

## 2017-01-31 LAB — BASIC METABOLIC PANEL WITH GFR
Anion gap: 4 — ABNORMAL LOW (ref 5–15)
BUN: 12 mg/dL (ref 6–20)
CO2: 25 mmol/L (ref 22–32)
Calcium: 8.3 mg/dL — ABNORMAL LOW (ref 8.9–10.3)
Chloride: 110 mmol/L (ref 101–111)
Creatinine, Ser: 0.67 mg/dL (ref 0.44–1.00)
GFR calc Af Amer: >60 mL/min
GFR calc non Af Amer: >60 mL/min
Glucose, Bld: 138 mg/dL — ABNORMAL HIGH (ref 65–99)
Potassium: 3.8 mmol/L (ref 3.5–5.1)
Sodium: 139 mmol/L (ref 135–145)

## 2017-01-31 LAB — LACTIC ACID, PLASMA
LACTIC ACID, VENOUS: 3.3 mmol/L — AB (ref 0.5–1.9)
Lactic Acid, Venous: 1.2 mmol/L (ref 0.5–1.9)
Lactic Acid, Venous: 2.1 mmol/L (ref 0.5–1.9)

## 2017-01-31 LAB — HIV ANTIBODY (ROUTINE TESTING W REFLEX): HIV SCREEN 4TH GENERATION: NONREACTIVE

## 2017-01-31 LAB — MAGNESIUM: Magnesium: 2.1 mg/dL (ref 1.7–2.4)

## 2017-01-31 MED ORDER — DM-GUAIFENESIN ER 30-600 MG PO TB12
2.0000 | ORAL_TABLET | Freq: Two times a day (BID) | ORAL | Status: DC
  Administered 2017-01-31 – 2017-02-02 (×5): 2 via ORAL
  Filled 2017-01-31 (×5): qty 2

## 2017-01-31 MED ORDER — HYDRALAZINE HCL 20 MG/ML IJ SOLN: 5.0000 mg | INTRAMUSCULAR | Status: DC | PRN

## 2017-01-31 MED ORDER — HYDROCODONE-HOMATROPINE 5-1.5 MG/5ML PO SYRP: 5.0000 mL | ORAL_SOLUTION | Freq: Four times a day (QID) | ORAL | Status: DC | PRN

## 2017-01-31 MED ORDER — IPRATROPIUM BROMIDE 0.02 % IN SOLN
0.5000 mg | Freq: Four times a day (QID) | RESPIRATORY_TRACT | Status: DC
  Administered 2017-02-01: 0.5 mg via RESPIRATORY_TRACT
  Filled 2017-01-31: qty 2.5

## 2017-01-31 MED ORDER — PANTOPRAZOLE SODIUM 40 MG PO TBEC
40.0000 mg | DELAYED_RELEASE_TABLET | Freq: Every day | ORAL | Status: DC
  Administered 2017-01-31 – 2017-02-02 (×3): 40 mg via ORAL
  Filled 2017-01-31 (×3): qty 1

## 2017-01-31 MED ORDER — BUDESONIDE 0.5 MG/2ML IN SUSP
0.5000 mg | Freq: Two times a day (BID) | RESPIRATORY_TRACT | Status: DC
  Administered 2017-01-31 – 2017-02-02 (×5): 0.5 mg via RESPIRATORY_TRACT
  Filled 2017-01-31 (×5): qty 2

## 2017-01-31 MED ORDER — LORATADINE 10 MG PO TABS
10.0000 mg | ORAL_TABLET | Freq: Every day | ORAL | Status: DC
  Administered 2017-01-31 – 2017-02-02 (×3): 10 mg via ORAL
  Filled 2017-01-31 (×3): qty 1

## 2017-01-31 MED ORDER — ARFORMOTEROL TARTRATE 15 MCG/2ML IN NEBU
15.0000 ug | INHALATION_SOLUTION | Freq: Two times a day (BID) | RESPIRATORY_TRACT | Status: DC
  Administered 2017-01-31 – 2017-02-02 (×5): 15 ug via RESPIRATORY_TRACT
  Filled 2017-01-31 (×5): qty 2

## 2017-01-31 MED ORDER — FLUTICASONE PROPIONATE 50 MCG/ACT NA SUSP
2.0000 | Freq: Every day | NASAL | Status: DC
  Administered 2017-01-31 – 2017-02-02 (×3): 2 via NASAL
  Filled 2017-01-31: qty 16

## 2017-01-31 MED ORDER — LEVALBUTEROL HCL 0.63 MG/3ML IN NEBU: 0.6300 mg | INHALATION_SOLUTION | RESPIRATORY_TRACT | Status: DC | PRN

## 2017-01-31 MED ORDER — LEVALBUTEROL HCL 1.25 MG/0.5ML IN NEBU
1.2500 mg | INHALATION_SOLUTION | Freq: Four times a day (QID) | RESPIRATORY_TRACT | Status: DC
  Administered 2017-02-01: 1.25 mg via RESPIRATORY_TRACT
  Filled 2017-01-31: qty 0.5

## 2017-01-31 MED ORDER — LEVOFLOXACIN IN D5W 500 MG/100ML IV SOLN
500.0000 mg | INTRAVENOUS | Status: DC
  Administered 2017-01-31: 500 mg via INTRAVENOUS
  Filled 2017-01-31: qty 100

## 2017-01-31 NOTE — Progress Notes (Signed)
CRITICAL VALUE ALERT  Critical Value:  Lactic acid 5.5  Date & Time Notied:  01/30/17 2207  Provider Notified: Dr Blaine Hamper  Orders Received/Actions taken: increase bolus by 566ml

## 2017-01-31 NOTE — Progress Notes (Signed)
CRITICAL VALUE ALERT  Critical Value:  Lactic acid 3.3  Date & Time Notied:  01/30/17 2240  Provider Notified: Not done. Lactic acid is trending down and previous orders not completely implemented  Orders Received/Actions taken:

## 2017-01-31 NOTE — Progress Notes (Signed)
PROGRESS NOTE    Rachael Peters  ONG:[id number] DOB: [birth year] DOA: 01/30/2017 PCP: Leonard Downing, MD    Brief Narrative:  Patient is a 56 year old female history of asthma, depression, anxiety, history of intermittent tobacco use over the past 20-30 years presented with fever body aches cough and shortness of breath.  Patient also with complaints of productive cough of greenish sputum, chest tightness.  Patient patient had been seen at the urgent care center after his symptoms are started 4 days prior to admission and was noted to be positive for the flu and discharged home on Tamiflu.  Patient had taken only 3 doses prior to admission.  On admission patient was noted to have a leukocytosis, tachycardia, tachypnea, oxygen sats of 85% on room air, ABG with a pH of 7.34, PCO2 of 37, PO2 of 290 chest x-ray was negative for any infiltrates and patient started on BiPAP.  The patient admitted to the stepdown unit and being treated as a asthma exacerbation plus or minus bronchitis with scheduled nebs, IV steroids, IV antibiotics.   Assessment & Plan:   Principal Problem:   Acute respiratory failure with hypoxia (HCC) Active Problems:   Asthma exacerbation   Sepsis (HCC)   Influenza   Anxiety   Depression with anxiety   Bronchitis  #1 acute respiratory failure with hypoxia secondary to probable asthma exacerbation and bronchitis plus or minus COPD exacerbation/sepsis Patient presenting with acute rotatory failure with hypoxia, shortness of breath, productive cough, myalgias recently diagnosed with the flu on Tamiflu.  Patient initially placed on BiPAP however currently on nasal cannula.  Patient with use of accessory muscles of respiration on my examination.  Patient not at baseline.  Continue IV steroids, scheduled nebulizer treatments.  Will add Pulmicort, Brovana, Claritin, Flonase, Protonix, IV Levaquin.  Increase Mucinex DM to 2 tabs twice daily.  Repeat chest x-ray this morning.   Supportive care.  2.  Influenza Patient recently diagnosed with the flu.  Continue Tamiflu.  3.  Depression/anxiety Stable.  Continue Lexapro.  Xanax as needed.   DVT prophylaxis: Lovenox Code Status: Full Family Communication: Updated patient and husband at bedtime. Disposition Plan: Remain in stepdown unit today.  Likely transfer to telemetry tomorrow if clinically improved.   Consultants:   None  Procedures:   Chest x-ray 01/30/2017, 01/31/2017    Antimicrobials:   IV Levaquin 01/31/2017   Subjective: Patient sitting up in bed states she feels much better than she did on admission.  Denies any chest pain.  Currently on nasal cannula.  Was on BiPAP for approximately 4-5 hours last night.  Patient with a productive cough of greenish sputum.  Patient asking whether she can go home.  Objective: Vitals:   01/31/17 0600 01/31/17 0730 01/31/17 0758 01/31/17 0800  BP: 115/70  137/68   Pulse: 80     Resp: (!) 26     Temp:    98.4 F (36.9 C)  TempSrc:    Oral  SpO2: 95% 99%    Weight:      Height:        Intake/Output Summary (Last 24 hours) at 01/31/2017 0934 Last data filed at 01/31/2017 0700 Gross per 24 hour  Intake 5657.92 ml  Output 500 ml  Net 5157.92 ml   Filed Weights   01/30/17 1035 01/30/17 1822  Weight: 70.3 kg (155 lb) 72 kg (158 lb 11.7 oz)    Examination:  General exam: Some use of accessory muscles of respiration.   Respiratory system:  Coarse breath sounds, no crackles, some scattered rhonchi, expiratory wheezing.  Some use of accessory muscles of respiration. Cardiovascular system: S1 & S2 heard, RRR. No JVD, murmurs, rubs, gallops or clicks. No pedal edema. Gastrointestinal system: Abdomen is nondistended, soft and nontender. No organomegaly or masses felt. Normal bowel sounds heard. Central nervous system: Alert and oriented. No focal neurological deficits. Extremities: Symmetric 5 x 5 power. Skin: No rashes, lesions or  ulcers Psychiatry: Judgement and insight appear normal. Mood & affect appropriate.     Data Reviewed: I have personally reviewed following labs and imaging studies  CBC: Recent Labs  Lab 01/30/17 1059  WBC 10.9*  NEUTROABS 9.4*  HGB 15.0  HCT 44.7  MCV 87.5  PLT 010   Basic Metabolic Panel: Recent Labs  Lab 01/30/17 1059  NA 137  K 3.7  CL 104  CO2 25  GLUCOSE 164*  BUN 9  CREATININE 0.77  CALCIUM 9.3   GFR: Estimated Creatinine Clearance: 86.9 mL/min (by C-G formula based on SCr of 0.77 mg/dL). Liver Function Tests: No results for input(s): AST, ALT, ALKPHOS, BILITOT, PROT, ALBUMIN in the last 168 hours. No results for input(s): LIPASE, AMYLASE in the last 168 hours. No results for input(s): AMMONIA in the last 168 hours. Coagulation Profile: No results for input(s): INR, PROTIME in the last 168 hours. Cardiac Enzymes: No results for input(s): CKTOTAL, CKMB, CKMBINDEX, TROPONINI in the last 168 hours. BNP (last 3 results) No results for input(s): PROBNP in the last 8760 hours. HbA1C: No results for input(s): HGBA1C in the last 72 hours. CBG: Recent Labs  Lab 01/30/17 2024  GLUCAP 224*   Lipid Profile: No results for input(s): CHOL, HDL, LDLCALC, TRIG, CHOLHDL, LDLDIRECT in the last 72 hours. Thyroid Function Tests: No results for input(s): TSH, T4TOTAL, FREET4, T3FREE, THYROIDAB in the last 72 hours. Anemia Panel: No results for input(s): VITAMINB12, FOLATE, FERRITIN, TIBC, IRON, RETICCTPCT in the last 72 hours. Sepsis Labs: Recent Labs  Lab 01/30/17 2020 01/30/17 2239 01/31/17 0642  PROCALCITON 1.26  --   --   LATICACIDVEN 5.5* 3.3* 1.2    Recent Results (from the past 240 hour(s))  MRSA PCR Screening     Status: None   Collection Time: 01/30/17  6:59 PM  Result Value Ref Range Status   MRSA by PCR NEGATIVE NEGATIVE Final    Comment:        The GeneXpert MRSA Assay (FDA approved for NASAL specimens only), is one component of  a comprehensive MRSA colonization surveillance program. It is not intended to diagnose MRSA infection nor to guide or monitor treatment for MRSA infections.          Radiology Studies: Dg Chest Port 1 View  Result Date: 01/30/2017 CLINICAL DATA:  Shortness of breath and chest pressure. Positive test for flu. History of asthma. EXAM: PORTABLE CHEST 1 VIEW COMPARISON:  03/15/2015 FINDINGS: Cardiomediastinal silhouette is normal. Mediastinal contours appear intact. There is no evidence of focal airspace consolidation, pleural effusion or pneumothorax. Hyperinflation of the lungs. Osseous structures are without acute abnormality. Soft tissues are grossly normal. IMPRESSION: Hyperinflation of the lungs without evidence of peribronchial thickening or lobar airspace consolidation. Electronically Signed   By: Fidela Salisbury M.D.   On: 01/30/2017 11:41        Scheduled Meds: . arformoterol  15 mcg Nebulization BID  . budesonide (PULMICORT) nebulizer solution  0.5 mg Nebulization BID  . dextromethorphan-guaiFENesin  2 tablet Oral BID  . enoxaparin (LOVENOX) injection  40  mg Subcutaneous Q24H  . escitalopram  10 mg Oral Daily  . fluticasone  2 spray Each Nare Daily  . ipratropium  0.5 mg Nebulization Q6H  . levalbuterol  1.25 mg Nebulization Q6H  . loratadine  10 mg Oral Daily  . mouth rinse  15 mL Mouth Rinse BID  . methylPREDNISolone (SOLU-MEDROL) injection  60 mg Intravenous TID  . montelukast  10 mg Oral QHS  . oseltamivir  75 mg Oral BID  . pantoprazole  40 mg Oral Q0600   Continuous Infusions: . sodium chloride 100 mL/hr at 01/31/17 0756  . levofloxacin (LEVAQUIN) IV       LOS: 1 day    Time spent: [place], MD [facility] Pager [phone]  If 7PM-7AM, please contact night-coverage [email or web address] Password [facility] [place] 01/31/2017, 9:34 AM

## 2017-01-31 NOTE — Care Management Note (Signed)
Case Management Note  Patient Details  Name: Rachael Peters MRN: [id number] Date of Birth: [birth year]  Subjective/Objective:                  Dyspnea and flu requiring o2 supplementation  Action/Plan: Date: January 31, 2017 Velva Harman, BSN, [place], [place] Chart and notes review for patient progress and needs. Will follow for case management and discharge needs. Next review date: [id number]  Expected Discharge Date:                  Expected Discharge Plan:  Home/Self Care  In-House Referral:     Discharge planning Services  CM Consult  Post Acute Care Choice:    Choice offered to:     DME Arranged:    DME Agency:     HH Arranged:    HH Agency:     Status of Service:  In process, will continue to follow  If discussed at Long Length of Stay Meetings, dates discussed:    Additional Comments:  Leeroy Cha, RN 01/31/2017, 7:43 AM

## 2017-02-01 LAB — BLOOD CULTURE ID PANEL (REFLEXED)
ACINETOBACTER BAUMANNII: NOT DETECTED
CANDIDA KRUSEI: NOT DETECTED
Candida albicans: NOT DETECTED
Candida glabrata: NOT DETECTED
Candida parapsilosis: NOT DETECTED
Candida tropicalis: NOT DETECTED
ENTEROCOCCUS SPECIES: NOT DETECTED
ESCHERICHIA COLI: NOT DETECTED
Enterobacter cloacae complex: NOT DETECTED
Enterobacteriaceae species: NOT DETECTED
HAEMOPHILUS INFLUENZAE: NOT DETECTED
Klebsiella oxytoca: NOT DETECTED
Klebsiella pneumoniae: NOT DETECTED
LISTERIA MONOCYTOGENES: NOT DETECTED
METHICILLIN RESISTANCE: NOT DETECTED
NEISSERIA MENINGITIDIS: NOT DETECTED
PROTEUS SPECIES: NOT DETECTED
PSEUDOMONAS AERUGINOSA: NOT DETECTED
SERRATIA MARCESCENS: NOT DETECTED
STAPHYLOCOCCUS AUREUS BCID: NOT DETECTED
STAPHYLOCOCCUS SPECIES: DETECTED — AB
STREPTOCOCCUS AGALACTIAE: NOT DETECTED
STREPTOCOCCUS PNEUMONIAE: NOT DETECTED
STREPTOCOCCUS SPECIES: NOT DETECTED
Streptococcus pyogenes: NOT DETECTED

## 2017-02-01 LAB — CBC WITH DIFFERENTIAL/PLATELET
BASOS ABS: 0 10*3/uL (ref 0.0–0.1)
BASOS PCT: 0 %
EOS ABS: 0 10*3/uL (ref 0.0–0.7)
Eosinophils Relative: 0 %
HEMATOCRIT: 36.1 % (ref 36.0–46.0)
HEMOGLOBIN: 11.8 g/dL — AB (ref 12.0–15.0)
Lymphocytes Relative: 7 %
Lymphs Abs: 0.8 10*3/uL (ref 0.7–4.0)
MCH: 29 pg (ref 26.0–34.0)
MCHC: 32.7 g/dL (ref 30.0–36.0)
MCV: 88.7 fL (ref 78.0–100.0)
MONO ABS: 0.3 10*3/uL (ref 0.1–1.0)
Monocytes Relative: 2 %
NEUTROS ABS: 11.1 10*3/uL — AB (ref 1.7–7.7)
NEUTROS PCT: 91 %
Platelets: 168 10*3/uL (ref 150–400)
RBC: 4.07 MIL/uL (ref 3.87–5.11)
RDW: 14.1 % (ref 11.5–15.5)
WBC: 12.3 10*3/uL — ABNORMAL HIGH (ref 4.0–10.5)

## 2017-02-01 LAB — BASIC METABOLIC PANEL
ANION GAP: 5 (ref 5–15)
BUN: 14 mg/dL (ref 6–20)
CALCIUM: 8.2 mg/dL — AB (ref 8.9–10.3)
CO2: 26 mmol/L (ref 22–32)
Chloride: 107 mmol/L (ref 101–111)
Creatinine, Ser: 0.65 mg/dL (ref 0.44–1.00)
GFR calc non Af Amer: >60 mL/min (ref >60)
Glucose, Bld: 142 mg/dL — ABNORMAL HIGH (ref 65–99)
Potassium: 3.8 mmol/L (ref 3.5–5.1)
SODIUM: 138 mmol/L (ref 135–145)

## 2017-02-01 LAB — LACTIC ACID, PLASMA: LACTIC ACID, VENOUS: 0.9 mmol/L (ref 0.5–1.9)

## 2017-02-01 MED ORDER — METHYLPREDNISOLONE SODIUM SUCC 40 MG IJ SOLR: 40.0000 mg | Freq: Two times a day (BID) | INTRAMUSCULAR | Status: DC

## 2017-02-01 MED ORDER — LEVALBUTEROL HCL 1.25 MG/0.5ML IN NEBU
1.2500 mg | INHALATION_SOLUTION | Freq: Three times a day (TID) | RESPIRATORY_TRACT | Status: DC
  Administered 2017-02-01 – 2017-02-02 (×3): 1.25 mg via RESPIRATORY_TRACT
  Filled 2017-02-01 (×3): qty 0.5

## 2017-02-01 MED ORDER — PREDNISONE 50 MG PO TABS
50.0000 mg | ORAL_TABLET | Freq: Every day | ORAL | Status: DC
  Administered 2017-02-02: 50 mg via ORAL
  Filled 2017-02-01: qty 1

## 2017-02-01 MED ORDER — METHYLPREDNISOLONE SODIUM SUCC 40 MG IJ SOLR
40.0000 mg | Freq: Two times a day (BID) | INTRAMUSCULAR | Status: AC
  Administered 2017-02-01: 40 mg via INTRAVENOUS
  Filled 2017-02-01: qty 1

## 2017-02-01 MED ORDER — IPRATROPIUM BROMIDE 0.02 % IN SOLN
0.5000 mg | Freq: Three times a day (TID) | RESPIRATORY_TRACT | Status: DC
  Administered 2017-02-01 – 2017-02-02 (×3): 0.5 mg via RESPIRATORY_TRACT
  Filled 2017-02-01 (×3): qty 2.5

## 2017-02-01 NOTE — Plan of Care (Signed)
  Progressing Health Behavior/Discharge Planning: Ability to manage health-related needs will improve 02/01/2017 1444 - Progressing by Staci Righter, RN Clinical Measurements: Ability to maintain clinical measurements within normal limits will improve 02/01/2017 1444 - Progressing by Staci Righter, RN Will remain free from infection 02/01/2017 1444 - Progressing by Staci Righter, RN Diagnostic test results will improve 02/01/2017 1444 - Progressing by Staci Righter, RN Respiratory complications will improve 02/01/2017 1444 - Progressing by Staci Righter, RN Activity: Risk for activity intolerance will decrease 02/01/2017 1444 - Progressing by Staci Righter, RN

## 2017-02-01 NOTE — Progress Notes (Signed)
Pt transferred to 3W via wheelchair and on oxygen 1L [place]. Pt stable at time of transfer with main concern regarding the ability to shower once in another room. Report given to Jocelyn Lamer, RN on 3w.

## 2017-02-01 NOTE — Progress Notes (Signed)
PROGRESS NOTE    Rachael Peters  PIR:[id number] DOB: 11-Jun-1960 DOA: 01/30/2017 PCP: Leonard Downing, MD    Brief Narrative:  Patient is a 56 year old female history of asthma, depression, anxiety, history of intermittent tobacco use over the past 20-30 years presented with fever body aches cough and shortness of breath.  Patient also with complaints of productive cough of greenish sputum, chest tightness.  Patient patient had been seen at the urgent care center after his symptoms are started 4 days prior to admission and was noted to be positive for the flu and discharged home on Tamiflu.  Patient had taken only 3 doses prior to admission.  On admission patient was noted to have a leukocytosis, tachycardia, tachypnea, oxygen sats of 85% on room air, ABG with a pH of 7.34, PCO2 of 37, PO2 of 290 chest x-ray was negative for any infiltrates and patient started on BiPAP.  The patient admitted to the stepdown unit and being treated as a asthma exacerbation plus or minus bronchitis with scheduled nebs, IV steroids, IV antibiotics.   Assessment & Plan:    #1 acute respiratory failure with hypoxia secondary to asthma exacerbation and influenza -Improving, off BiPAP currently  -Cut down IV Solu-Medrol  -Complete Tamiflu course, stop IV levofloxacin  -Transfer out of stepdown, ambulate, hopefully home tomorrow   2.  Influenza -day3 of Tamiflu  3.  Depression/anxiety Stable.  Continue Lexapro.  Xanax as needed.   DVT prophylaxis: Lovenox Code Status: Full Family Communication: Updated patient and husband at bedtime. Disposition: Transfer to floor, home tomorrow if stable   None  Procedures:   Chest x-ray 01/30/2017, 01/31/2017    Antimicrobials:   IV Levaquin 01/31/2017   Subjective: - proceeding improving, not back to baseline yet, still cough and rare wheezes  Objective: Vitals:   01/31/17 2302 02/01/17 0315 02/01/17 0800 02/01/17 1200  BP: (!) 108/59 114/65      Pulse: 86 87 75   Resp: (!) 21 (!) 26 18   Temp: 98 F (36.7 C) 98.1 F (36.7 C) 98.5 F (36.9 C) 98.4 F (36.9 C)  TempSrc: Oral  Oral Oral  SpO2: 93% 93% 97%   Weight:      Height:        Intake/Output Summary (Last 24 hours) at 02/01/2017 1302 Last data filed at 02/01/2017 0900 Gross per 24 hour  Intake 360 ml  Output -  Net 360 ml   Filed Weights   01/30/17 1035 01/30/17 1822 01/31/17 0939  Weight: 70.3 kg (155 lb) 72 kg (158 lb 11.7 oz) 72 kg (158 lb 11.7 oz)    Examination:  Gen: Awake, Alert, Oriented X 3, no distress HEENT: PERRLA, Neck supple, no JVD Lungs: Improved air movement, rare expiratory wheezes CVS: RRR,No Gallops,Rubs or new Murmurs Abd: soft, Non tender, non distended, BS present Extremities: No Cyanosis, Clubbing or edema Skin: no new rashes  Data Reviewed: I have personally reviewed following labs and imaging studies  CBC: Recent Labs  Lab 01/30/17 1059 01/31/17 0922 02/01/17 0308  WBC 10.9* 16.4* 12.3*  NEUTROABS 9.4* 14.4* 11.1*  HGB 15.0 12.0 11.8*  HCT 44.7 36.3 36.1  MCV 87.5 89.6 88.7  PLT 179 171 630   Basic Metabolic Panel: Recent Labs  Lab 01/30/17 1059 01/31/17 0922 02/01/17 0308  NA 137 139 138  K 3.7 3.8 3.8  CL 104 110 107  CO2 25 25 26   GLUCOSE 164* 138* 142*  BUN 9 12 14   CREATININE 0.77 0.67  0.65  CALCIUM 9.3 8.3* 8.2*  MG  --  2.1  --    GFR: Estimated Creatinine Clearance: 86.9 mL/min (by C-G formula based on SCr of 0.65 mg/dL). Liver Function Tests: No results for input(s): AST, ALT, ALKPHOS, BILITOT, PROT, ALBUMIN in the last 168 hours. No results for input(s): LIPASE, AMYLASE in the last 168 hours. No results for input(s): AMMONIA in the last 168 hours. Coagulation Profile: No results for input(s): INR, PROTIME in the last 168 hours. Cardiac Enzymes: No results for input(s): CKTOTAL, CKMB, CKMBINDEX, TROPONINI in the last 168 hours. BNP (last 3 results) No results for input(s): PROBNP in the last  8760 hours. HbA1C: No results for input(s): HGBA1C in the last 72 hours. CBG: Recent Labs  Lab 01/30/17 2024  GLUCAP 224*   Lipid Profile: No results for input(s): CHOL, HDL, LDLCALC, TRIG, CHOLHDL, LDLDIRECT in the last 72 hours. Thyroid Function Tests: No results for input(s): TSH, T4TOTAL, FREET4, T3FREE, THYROIDAB in the last 72 hours. Anemia Panel: No results for input(s): VITAMINB12, FOLATE, FERRITIN, TIBC, IRON, RETICCTPCT in the last 72 hours. Sepsis Labs: Recent Labs  Lab 01/30/17 2020 01/30/17 2239 01/31/17 0642 01/31/17 0922 02/01/17 0308  PROCALCITON 1.26  --   --   --   --   LATICACIDVEN 5.5* 3.3* 1.2 2.1* 0.9    Recent Results (from the past 240 hour(s))  MRSA PCR Screening     Status: None   Collection Time: 01/30/17  6:59 PM  Result Value Ref Range Status   MRSA by PCR NEGATIVE NEGATIVE Final    Comment:        The GeneXpert MRSA Assay (FDA approved for NASAL specimens only), is one component of a comprehensive MRSA colonization surveillance program. It is not intended to diagnose MRSA infection nor to guide or monitor treatment for MRSA infections.   Culture, blood (x 2)     Status: None (Preliminary result)   Collection Time: 01/30/17  8:15 PM  Result Value Ref Range Status   Specimen Description BLOOD RIGHT ANTECUBITAL  Final   Special Requests AEROBIC BOTTLE ONLY Blood Culture adequate volume  Final   Culture  Setup Time   Final    GRAM POSITIVE COCCI IN CLUSTERS AEROBIC BOTTLE ONLY CRITICAL RESULT CALLED TO, READ BACK BY AND VERIFIED WITH: Gustavo Lah [id number] MLM Performed at [facility], [place] [address], [place], [place] [address]    Culture GRAM POSITIVE COCCI  Final   Report Status PENDING  Incomplete  Blood Culture ID Panel (Reflexed)     Status: Abnormal   Collection Time: 01/30/17  8:15 PM  Result Value Ref Range Status   Enterococcus species NOT DETECTED NOT DETECTED Final   Listeria monocytogenes NOT DETECTED NOT  DETECTED Final   Staphylococcus species DETECTED (A) NOT DETECTED Final    Comment: Methicillin (oxacillin) susceptible coagulase negative staphylococcus. Possible blood culture contaminant (unless isolated from more than one blood culture draw or clinical case suggests pathogenicity). No antibiotic treatment is indicated for blood  culture contaminants. CRITICAL RESULT CALLED TO, READ BACK BY AND VERIFIED WITH: PHARMD E JACKSON [id number] MLM    Staphylococcus aureus NOT DETECTED NOT DETECTED Final   Methicillin resistance NOT DETECTED NOT DETECTED Final   Streptococcus species NOT DETECTED NOT DETECTED Final   Streptococcus agalactiae NOT DETECTED NOT DETECTED Final   Streptococcus pneumoniae NOT DETECTED NOT DETECTED Final   Streptococcus pyogenes NOT DETECTED NOT DETECTED Final   Acinetobacter baumannii NOT DETECTED  NOT DETECTED Final   Enterobacteriaceae species NOT DETECTED NOT DETECTED Final   Enterobacter cloacae complex NOT DETECTED NOT DETECTED Final   Escherichia coli NOT DETECTED NOT DETECTED Final   Klebsiella oxytoca NOT DETECTED NOT DETECTED Final   Klebsiella pneumoniae NOT DETECTED NOT DETECTED Final   Proteus species NOT DETECTED NOT DETECTED Final   Serratia marcescens NOT DETECTED NOT DETECTED Final   Haemophilus influenzae NOT DETECTED NOT DETECTED Final   Neisseria meningitidis NOT DETECTED NOT DETECTED Final   Pseudomonas aeruginosa NOT DETECTED NOT DETECTED Final   Candida albicans NOT DETECTED NOT DETECTED Final   Candida glabrata NOT DETECTED NOT DETECTED Final   Candida krusei NOT DETECTED NOT DETECTED Final   Candida parapsilosis NOT DETECTED NOT DETECTED Final   Candida tropicalis NOT DETECTED NOT DETECTED Final    Comment: Performed at [facility], [place] [address], [place], [place] [address]         Radiology Studies: Dg Chest Port 1 View  Result Date: 01/31/2017 CLINICAL DATA:  Dyspnea EXAM: PORTABLE CHEST 1 VIEW COMPARISON:  Chest  radiograph from one day prior. FINDINGS: Stable cardiomediastinal silhouette with normal heart size. No pneumothorax. No pleural effusion. No pulmonary edema. No acute consolidative airspace disease. Mild peribronchial cuffing. IMPRESSION: Mild peribronchial cuffing, nonspecific, consider reactive airways disease or bronchitis. Otherwise no active disease in the chest. Electronically Signed   By: Ilona Sorrel M.D.   On: 01/31/2017 09:54        Scheduled Meds: . arformoterol  15 mcg Nebulization BID  . budesonide (PULMICORT) nebulizer solution  0.5 mg Nebulization BID  . dextromethorphan-guaiFENesin  2 tablet Oral BID  . enoxaparin (LOVENOX) injection  40 mg Subcutaneous Q24H  . escitalopram  10 mg Oral Daily  . fluticasone  2 spray Each Nare Daily  . ipratropium  0.5 mg Nebulization TID  . levalbuterol  1.25 mg Nebulization TID  . loratadine  10 mg Oral Daily  . mouth rinse  15 mL Mouth Rinse BID  . methylPREDNISolone (SOLU-MEDROL) injection  40 mg Intravenous Q12H  . montelukast  10 mg Oral QHS  . oseltamivir  75 mg Oral BID  . pantoprazole  40 mg Oral Q0600   Continuous Infusions:    LOS: 2 days    Time spent: [place], MD [facility] Page via [email or web address], password [email or web address]  If 7PM-7AM, please contact night-coverage  02/01/2017, 1:02 PM

## 2017-02-01 NOTE — Progress Notes (Signed)
PHARMACY - PHYSICIAN COMMUNICATION CRITICAL VALUE ALERT - BLOOD CULTURE IDENTIFICATION (BCID)  Rachael Peters is an 56 y.o. female who presented to [facility] on 01/30/2017 with a chief complaint of fever, body aches, productive cough, SOB.  She reportedly had positive influenza screen at urgent care and was discharged on [place].  Assessment:  Asthma exacerbation and bronchitis, +/- COPD exacerbation, Influenza -  Afebrile, WBC 12.3 (solumedrol)  Name of physician (or Provider) Contacted: Dr Broadus John  Current antibiotics: Levaquin given on 12/17, d/c on 12/18.  Tamiflu 12/16-12/20  Changes to prescribed antibiotics recommended:  Recommendations declined by provider due to culture likely contaminated.  No treatment needed.  Results for orders placed or performed during the hospital encounter of 01/30/17  Blood Culture ID Panel (Reflexed) (Collected: 01/30/2017  8:15 PM)  Result Value Ref Range   Enterococcus species NOT DETECTED NOT DETECTED   Listeria monocytogenes NOT DETECTED NOT DETECTED   Staphylococcus species DETECTED (A) NOT DETECTED   Staphylococcus aureus NOT DETECTED NOT DETECTED   Methicillin resistance NOT DETECTED NOT DETECTED   Streptococcus species NOT DETECTED NOT DETECTED   Streptococcus agalactiae NOT DETECTED NOT DETECTED   Streptococcus pneumoniae NOT DETECTED NOT DETECTED   Streptococcus pyogenes NOT DETECTED NOT DETECTED   Acinetobacter baumannii NOT DETECTED NOT DETECTED   Enterobacteriaceae species NOT DETECTED NOT DETECTED   Enterobacter cloacae complex NOT DETECTED NOT DETECTED   Escherichia coli NOT DETECTED NOT DETECTED   Klebsiella oxytoca NOT DETECTED NOT DETECTED   Klebsiella pneumoniae NOT DETECTED NOT DETECTED   Proteus species NOT DETECTED NOT DETECTED   Serratia marcescens NOT DETECTED NOT DETECTED   Haemophilus influenzae NOT DETECTED NOT DETECTED   Neisseria meningitidis NOT DETECTED NOT DETECTED   Pseudomonas aeruginosa NOT DETECTED NOT  DETECTED   Candida albicans NOT DETECTED NOT DETECTED   Candida glabrata NOT DETECTED NOT DETECTED   Candida krusei NOT DETECTED NOT DETECTED   Candida parapsilosis NOT DETECTED NOT DETECTED   Candida tropicalis NOT DETECTED NOT DETECTED    [place]ie, [place] 02/01/2017  9:19 AM

## 2017-02-02 MED ORDER — PREDNISONE 10 MG PO TABS: ORAL_TABLET | ORAL | Status: DC

## 2017-02-02 MED ORDER — OSELTAMIVIR PHOSPHATE 75 MG PO CAPS: 75.0000 mg | ORAL_CAPSULE | Freq: Two times a day (BID) | ORAL | 0 refills | Status: AC

## 2017-02-02 NOTE — Discharge Summary (Signed)
Physician Discharge Summary  Rachael Peters CLE:[id number] DOB: 07-17-1960 DOA: 01/30/2017  PCP: Rachael Downing, MD  Admit date: 01/30/2017 Discharge date: 02/02/2017  Time spent: 45 minutes  Recommendations for Outpatient Follow-up:  1. PCP in 1 week  Discharge Diagnoses:  Principal Problem:   Acute respiratory failure with hypoxia ([place]) Active Problems:   Asthma exacerbation   Sepsis ([place])   Influenza   Anxiety   Depression with anxiety   Bronchitis   Discharge Condition: stable  Diet recommendation: regular  Filed Weights   01/31/17 0939 02/02/17 0029 02/02/17 0500  Weight: 72 kg (158 lb 11.7 oz) 69.9 kg (154 lb) 69.9 kg (154 lb)    History of present illness:  Patient is a 56 year old female history of asthma, depression, anxiety, history of intermittent tobacco use over the past 20-30 years presented with fever body aches cough and shortness of breath.  Patient also with complaints of productive cough of greenish sputum, chest tightness.  Patient patient had been seen at the urgent care center after his symptoms are started 4 days prior to admission and was noted to be positive for the flu and discharged home on Tamiflu.  Patient had taken only 3 doses prior to admission.  On admission patient was noted to have a leukocytosis, tachycardia, tachypnea, oxygen sats of 85% on room air  Hospital Course:   Acute respiratory failure with hypoxia secondary to asthma exacerbation and influenza -she was influenza positive prior to admission -improved, treated with BIPAP, Iv solumedrol, Abx, nebs -discharged home in stable condition to complete course of tamiflu and prednisone taper  2.  Influenza -improving, day4 of Tamiflu now  3.  Depression/anxiety Stable.  Continue Lexapro.  Xanax PRN continued    Discharge Exam: Vitals:   02/02/17 0841 02/02/17 0946  BP:    Pulse: 74 72  Resp: 16   Temp:    SpO2: 92% 94%    General: AAOx3 Cardiovascular:  S!S2/RRR Respiratory: CTAB  Discharge Instructions   Discharge Instructions    Diet - low sodium heart healthy   Complete by:  As directed    Increase activity slowly   Complete by:  As directed      Allergies as of 02/02/2017   No Known Allergies     Medication List    TAKE these medications   escitalopram 5 MG tablet Commonly known as:  LEXAPRO Take 10 mg by mouth daily.   ESTROVEN PM PO Take by mouth.   montelukast 10 MG tablet Commonly known as:  SINGULAIR Take 10 mg by mouth as needed.   oseltamivir 75 MG capsule Commonly known as:  TAMIFLU Take 1 capsule (75 mg total) by mouth 2 (two) times daily for 1 day.   predniSONE 10 MG tablet Commonly known as:  DELTASONE Use previous prescription, take 40mg  once daily x1 day then 30mg  next day , 20mg  next day, 10mg  next day and then STOP   VENTOLIN HFA 108 (90 Base) MCG/ACT inhaler Generic drug:  albuterol Inhale 1 puff into the lungs as needed.   XANAX 0.25 MG tablet Generic drug:  ALPRAZolam Take 0.25 mg by mouth every 8 (eight) hours as needed (anxiety).      No Known Allergies Follow-up Information    Rachael Downing, MD. Schedule an appointment as soon as possible for a visit in 1 week(s).   Specialty:  Family Medicine Contact information: [address] [place] [address] [phone]  The results of significant diagnostics from this hospitalization (including imaging, microbiology, ancillary and laboratory) are listed below for reference.    Significant Diagnostic Studies: Dg Chest Port 1 View  Result Date: 01/31/2017 CLINICAL DATA:  Dyspnea EXAM: PORTABLE CHEST 1 VIEW COMPARISON:  Chest radiograph from one day prior. FINDINGS: Stable cardiomediastinal silhouette with normal heart size. No pneumothorax. No pleural effusion. No pulmonary edema. No acute consolidative airspace disease. Mild peribronchial cuffing. IMPRESSION: Mild peribronchial cuffing, nonspecific,  consider reactive airways disease or bronchitis. Otherwise no active disease in the chest. Electronically Signed   By: Ilona Sorrel M.D.   On: 01/31/2017 09:54   Dg Chest Port 1 View  Result Date: 01/30/2017 CLINICAL DATA:  Shortness of breath and chest pressure. Positive test for flu. History of asthma. EXAM: PORTABLE CHEST 1 VIEW COMPARISON:  03/15/2015 FINDINGS: Cardiomediastinal silhouette is normal. Mediastinal contours appear intact. There is no evidence of focal airspace consolidation, pleural effusion or pneumothorax. Hyperinflation of the lungs. Osseous structures are without acute abnormality. Soft tissues are grossly normal. IMPRESSION: Hyperinflation of the lungs without evidence of peribronchial thickening or lobar airspace consolidation. Electronically Signed   By: Fidela Salisbury M.D.   On: 01/30/2017 11:41    Microbiology: Recent Results (from the past 240 hour(s))  MRSA PCR Screening     Status: None   Collection Time: 01/30/17  6:59 PM  Result Value Ref Range Status   MRSA by PCR NEGATIVE NEGATIVE Final    Comment:        The GeneXpert MRSA Assay (FDA approved for NASAL specimens only), is one component of a comprehensive MRSA colonization surveillance program. It is not intended to diagnose MRSA infection nor to guide or monitor treatment for MRSA infections.   Culture, blood (x 2)     Status: None (Preliminary result)   Collection Time: 01/30/17  8:15 PM  Result Value Ref Range Status   Specimen Description BLOOD LEFT HAND  Final   Special Requests IN PEDIATRIC BOTTLE Blood Culture adequate volume  Final   Culture   Final    NO GROWTH 1 DAY Performed at [facility], [place] [address], [place], [place] [address]    Report Status PENDING  Incomplete  Culture, blood (x 2)     Status: None (Preliminary result)   Collection Time: 01/30/17  8:15 PM  Result Value Ref Range Status   Specimen Description BLOOD RIGHT ANTECUBITAL  Final   Special Requests  AEROBIC BOTTLE ONLY Blood Culture adequate volume  Final   Culture  Setup Time   Final    GRAM POSITIVE COCCI IN CLUSTERS AEROBIC BOTTLE ONLY CRITICAL RESULT CALLED TO, READ BACK BY AND VERIFIED WITH: Gustavo Lah [id number] MLM Performed at [facility], [place] [address] [place], [place] [address]    Culture GRAM POSITIVE COCCI  Final   Report Status PENDING  Incomplete  Blood Culture ID Panel (Reflexed)     Status: Abnormal   Collection Time: 01/30/17  8:15 PM  Result Value Ref Range Status   Enterococcus species NOT DETECTED NOT DETECTED Final   Listeria monocytogenes NOT DETECTED NOT DETECTED Final   Staphylococcus species DETECTED (A) NOT DETECTED Final    Comment: Methicillin (oxacillin) susceptible coagulase negative staphylococcus. Possible blood culture contaminant (unless isolated from more than one blood culture draw or clinical case suggests pathogenicity). No antibiotic treatment is indicated for blood  culture contaminants. CRITICAL RESULT CALLED TO, READ BACK BY AND VERIFIED WITH: PHARMD E  JACKSON [id number] [phone] MLM    Staphylococcus aureus NOT DETECTED NOT DETECTED Final   Methicillin resistance NOT DETECTED NOT DETECTED Final   Streptococcus species NOT DETECTED NOT DETECTED Final   Streptococcus agalactiae NOT DETECTED NOT DETECTED Final   Streptococcus pneumoniae NOT DETECTED NOT DETECTED Final   Streptococcus pyogenes NOT DETECTED NOT DETECTED Final   Acinetobacter baumannii NOT DETECTED NOT DETECTED Final   Enterobacteriaceae species NOT DETECTED NOT DETECTED Final   Enterobacter cloacae complex NOT DETECTED NOT DETECTED Final   Escherichia coli NOT DETECTED NOT DETECTED Final   Klebsiella oxytoca NOT DETECTED NOT DETECTED Final   Klebsiella pneumoniae NOT DETECTED NOT DETECTED Final   Proteus species NOT DETECTED NOT DETECTED Final   Serratia marcescens NOT DETECTED NOT DETECTED Final   Haemophilus influenzae NOT DETECTED NOT DETECTED Final   Neisseria  meningitidis NOT DETECTED NOT DETECTED Final   Pseudomonas aeruginosa NOT DETECTED NOT DETECTED Final   Candida albicans NOT DETECTED NOT DETECTED Final   Candida glabrata NOT DETECTED NOT DETECTED Final   Candida krusei NOT DETECTED NOT DETECTED Final   Candida parapsilosis NOT DETECTED NOT DETECTED Final   Candida tropicalis NOT DETECTED NOT DETECTED Final    Comment: Performed at [facility], [place] [address], [place], [place] [address]     Labs: Basic Metabolic Panel: Recent Labs  Lab 01/30/17 1059 01/31/17 0922 02/01/17 0308  NA 137 139 138  K 3.7 3.8 3.8  CL 104 110 107  CO2 25 25 26   GLUCOSE 164* 138* 142*  BUN 9 12 14   CREATININE 0.77 0.67 0.65  CALCIUM 9.3 8.3* 8.2*  MG  --  2.1  --    Liver Function Tests: No results for input(s): AST, ALT, ALKPHOS, BILITOT, PROT, ALBUMIN in the last 168 hours. No results for input(s): LIPASE, AMYLASE in the last 168 hours. No results for input(s): AMMONIA in the last 168 hours. CBC: Recent Labs  Lab 01/30/17 1059 01/31/17 0922 02/01/17 0308  WBC 10.9* 16.4* 12.3*  NEUTROABS 9.4* 14.4* 11.1*  HGB 15.0 12.0 11.8*  HCT 44.7 36.3 36.1  MCV 87.5 89.6 88.7  PLT 179 171 168   Cardiac Enzymes: No results for input(s): CKTOTAL, CKMB, CKMBINDEX, TROPONINI in the last 168 hours. BNP: BNP (last 3 results) No results for input(s): BNP in the last 8760 hours.  ProBNP (last 3 results) No results for input(s): PROBNP in the last 8760 hours.  CBG: Recent Labs  Lab 01/30/17 2024  GLUCAP 224*       Signed:  Domenic Polite MD.  [facility] 02/02/2017, 11:00 AM

## 2017-02-03 LAB — CULTURE, BLOOD (ROUTINE X 2): Special Requests: ADEQUATE

## 2017-02-05 LAB — CULTURE, BLOOD (ROUTINE X 2)
Culture: NO GROWTH
Special Requests: ADEQUATE

## 2017-02-16 ENCOUNTER — Other Ambulatory Visit: Payer: Self-pay | Admitting: Gastroenterology

## 2017-02-16 DIAGNOSIS — R93429 Abnormal radiologic findings on diagnostic imaging of unspecified kidney: Secondary | ICD-10-CM

## 2017-02-23 ENCOUNTER — Ambulatory Visit
Admission: RE | Admit: 2017-02-23 | Discharge: 2017-02-23 | Disposition: A | Discharge status: Home / Self Care | Payer: BLUE CROSS/BLUE SHIELD | Source: Ambulatory Visit | Attending: Gastroenterology | Admitting: Gastroenterology

## 2017-02-23 DIAGNOSIS — R93429 Abnormal radiologic findings on diagnostic imaging of unspecified kidney: Secondary | ICD-10-CM

## 2017-02-23 MED ORDER — GADOBENATE DIMEGLUMINE 529 MG/ML IV SOLN
15.0000 mL | Freq: Once | INTRAVENOUS | Status: AC | PRN
  Administered 2017-02-23: 15 mL via INTRAVENOUS

## 2017-02-24 ENCOUNTER — Other Ambulatory Visit (INDEPENDENT_AMBULATORY_CARE_PROVIDER_SITE_OTHER): Payer: BLUE CROSS/BLUE SHIELD

## 2017-02-24 ENCOUNTER — Encounter: Payer: Self-pay | Admitting: Internal Medicine

## 2017-02-24 ENCOUNTER — Ambulatory Visit: Payer: BLUE CROSS/BLUE SHIELD | Admitting: Internal Medicine

## 2017-02-24 VITALS — BP 124/84 | HR 65 | Ht 70.0 in | Wt 161.0 lb

## 2017-02-24 DIAGNOSIS — J449 Chronic obstructive pulmonary disease, unspecified: Secondary | ICD-10-CM

## 2017-02-24 DIAGNOSIS — R0609 Other forms of dyspnea: Secondary | ICD-10-CM

## 2017-02-24 LAB — CBC WITH DIFFERENTIAL/PLATELET
BASOS ABS: 0.1 10*3/uL (ref 0.0–0.1)
BASOS PCT: 0.9 % (ref 0.0–3.0)
EOS ABS: 0.3 10*3/uL (ref 0.0–0.7)
Eosinophils Relative: 5.5 % — ABNORMAL HIGH (ref 0.0–5.0)
HEMATOCRIT: 42 % (ref 36.0–46.0)
HEMOGLOBIN: 13.8 g/dL (ref 12.0–15.0)
LYMPHS PCT: 37.6 % (ref 12.0–46.0)
Lymphs Abs: 2.3 10*3/uL (ref 0.7–4.0)
MCHC: 32.8 g/dL (ref 30.0–36.0)
MCV: 89.2 fl (ref 78.0–100.0)
MONOS PCT: 6.5 % (ref 3.0–12.0)
Monocytes Absolute: 0.4 10*3/uL (ref 0.1–1.0)
NEUTROS ABS: 3 10*3/uL (ref 1.4–7.7)
Neutrophils Relative %: 49.5 % (ref 43.0–77.0)
PLATELETS: 196 10*3/uL (ref 150.0–400.0)
RBC: 4.71 Mil/uL (ref 3.87–5.11)
RDW: 14.3 % (ref 11.5–15.5)
WBC: 6 10*3/uL (ref 4.0–10.5)

## 2017-02-24 LAB — BRAIN NATRIURETIC PEPTIDE: Pro B Natriuretic peptide (BNP): 26 pg/mL (ref 0.0–100.0)

## 2017-02-24 MED ORDER — BUDESONIDE-FORMOTEROL FUMARATE 160-4.5 MCG/ACT IN AERO: 2.0000 | INHALATION_SPRAY | Freq: Two times a day (BID) | RESPIRATORY_TRACT | 11 refills | Status: DC

## 2017-02-24 MED ORDER — FAMOTIDINE 20 MG PO TABS: ORAL_TABLET | ORAL | 2 refills | Status: DC

## 2017-02-24 MED ORDER — PANTOPRAZOLE SODIUM 40 MG PO TBEC: 40.0000 mg | DELAYED_RELEASE_TABLET | Freq: Every day | ORAL | 2 refills | Status: DC

## 2017-02-24 MED ORDER — BUDESONIDE-FORMOTEROL FUMARATE 160-4.5 MCG/ACT IN AERO: 2.0000 | INHALATION_SPRAY | Freq: Two times a day (BID) | RESPIRATORY_TRACT | 0 refills | Status: DC

## 2017-02-24 NOTE — Assessment & Plan Note (Addendum)
02/24/2017  Walked RA x 3 laps @ 185 ft each stopped due to  End of study, nl pace, no sob or desat    - Spirometry 02/24/2017  Not physiologic    Symptoms are markedly disproportionate to objective findings and not clear this is actually much of a  lung problem but pt does appear to have difficult to sort out respiratory symptoms of unknown origin for which  DDX  = almost all start with A and  include Adherence, Ace Inhibitors, Acid Reflux, Active Sinus Disease, Alpha 1 Antitripsin deficiency, Anxiety masquerading as Airways dz,  ABPA,  Allergy(esp in young), Aspiration (esp in elderly), Adverse effects of meds,  Active smokers, A bunch of PE's (a small clot burden can't cause this syndrome unless there is already severe underlying pulm or vascular dz with poor reserve) plus two Bs  = Bronchiectasis and Beta blocker use..and one C= CHF     Adherence is always the initial "prime suspect" and is a multilayered concern that requires a "trust but verify" approach in every patient - starting with knowing how to use medications, especially inhalers, correctly, keeping up with refills and understanding the fundamental difference between maintenance and prns vs those medications only taken for a very short course and then stopped and not refilled.  - see hfa teaching  - return with all meds in hand using a trust but verify approach to confirm accurate Medication  Reconciliation The principal here is that until we are certain that the  patients are doing what we've asked, it makes no sense to ask them to do more.    ? Acid (or non-acid) GERD > always difficult to exclude as up to 75% of pts in some series report no assoc GI/ Heartburn symptoms> rec max (24h)  acid suppression and diet restrictions/ reviewed and instructions given in writing.   ? Allergy > send profile / continue singulair   ? Active smoking > denies since Dec 2018, encouraged to maintain off   ? Alpha One AT > send screen   ? Anxiety >  usually at the bottom of this list of usual suspects but should be much higher on this pt's based on H and P and note already on psychotropics .    ? A bunch of pe's > D dimer nl - while  A nl valute  may miss small peripheral pe, the clot burden with sob is moderately high and the d dimer has a very high neg pred value in this setting    ? chf >  BNP wnl rules out    Total time devoted to counseling  > 50 % of initial 60 min office visit:  review case with pt/ discussion of options/alternatives/ personally creating written customized instructions  in presence of pt  then going over those specific  Instructions directly with the pt including how to use all of the meds but in particular covering each new medication in detail and the difference between the maintenance= "automatic" meds and the prns using an action plan format for the latter (If this problem/symptom => do that organization reading Left to right).  Please see AVS from this visit for a full list of these instructions which I personally wrote for this pt and  are unique to this visit.

## 2017-02-24 NOTE — Progress Notes (Signed)
Subjective:     Patient ID: Rachael Peters, female   DOB: 10/14/1960,   MRN: [id number]  HPI  57 yowf variable smoker quit most recently Dec 14th 2018 with variable sob requiring multiple ERV so referred to pulmonary clinic 02/24/2017 by EDP    02/24/2017 1st [facility] office visit/ Wert   Chief Complaint  Patient presents with  . Pulmonary Consult    Self referral. Pt c/o dyspnea for the past several years. She states that she started having more trouble after developed influenza Dec 2018. She states she gets winded with exercise and occ after eating. She has an albuterol inhaler that she uses 2 x per wk on average, "sometimes not at all".   no trouble with breathing during childhood or 28 week IUP with new sob x 5 years prior to OV  - whereas used to be able to jog and still able to do treadmill and eliptical s difficulty with declining tol x 2 years including 2 days prior to OV  And also bad attacks that aren't directly related to exertion ? Related to eating -  better with saba but sometimes not and had to go to ER x 3 -  Severe episode Dec 15 with flu like symptoms >     Admit date: 01/30/2017 Discharge date: 02/02/2017   Discharge Diagnoses:  Principal Problem:   Acute respiratory failure with hypoxia ([place]) Active Problems:   Asthma exacerbation   Sepsis ([place])   Influenza   Anxiety   Depression with anxiety   Bronchitis    History of present illness:  Patient is a 57 year old female history of asthma, depression, anxiety, history of intermittent tobacco use over the past 20-30 years presented with fever body aches cough and shortness of breath. Patient also with complaints of productive cough of greenish sputum, chest tightness. Patient patient had been seen at the urgent care center after his symptoms are started 4 days prior to admission and was noted to be positive for the flu and discharged home on Tamiflu. Patient had taken only 3 doses prior to admission.  On admission patient was noted to have a leukocytosis, tachycardia, tachypnea, oxygen sats of 85% on room air  Hospital Course:   Acute respiratory failure with hypoxia secondary toasthma exacerbation and influenza -she was influenza positive prior to admission -improved, treated with BIPAP, Iv solumedrol, Abx, nebs -discharged home in stable condition to complete course of tamiflu and prednisone taper  2. Influenza -improving, day4 ofTamiflu now  3. Depression/anxiety Stable. Continue Lexapro. Xanax PRN continued   At home since d/c continues with doe but MMRC1 = can walk nl pace, flat grade, can't hurry or go uphills or steps s sob  / rare noct need for saba/ mostly daytime   No obvious day to day or daytime variability or assoc excess/ purulent sputum or mucus plugs or hemoptysis or cp or chest tightness, subjective wheeze or overt sinus or hb symptoms. No unusual exposure hx or h/o childhood pna/ asthma or knowledge of premature birth.  Sleeping ok flat without nocturnal  or early am exacerbation  of respiratory  c/o's or need for noct saba. Also denies any obvious fluctuation of symptoms with weather or environmental changes or other aggravating or alleviating factors except as outlined above   Current Allergies, Complete Past Medical History, Past Surgical History, Family History, and Social History were reviewed in [employer] record.  ROS  The following are not active complaints unless bolded Hoarseness, sore  throat, dysphagia, dental problems, itching, sneezing,  nasal congestion or discharge of excess mucus or purulent secretions, ear ache,   fever, chills, sweats, unintended wt loss or wt gain, classically pleuritic or exertional cp,  orthopnea pnd or leg swelling, presyncope, palpitations, abdominal pain, anorexia, nausea, vomiting, diarrhea  or change in bowel habits or change in bladder habits, change in stools or change in urine, dysuria,  hematuria,  rash, arthralgias, visual complaints, headache, numbness, weakness or ataxia or problems with walking or coordination,  change in mood/affect or memory.        Current Meds  Medication Sig  . ALPRAZolam (XANAX) 0.25 MG tablet Take 0.25 mg by mouth every 8 (eight) hours as needed (anxiety).   Marland Kitchen escitalopram (LEXAPRO) 5 MG tablet Take 10 mg by mouth daily.   . montelukast (SINGULAIR) 10 MG tablet Take 10 mg by mouth as needed.   . VENTOLIN HFA 108 (90 Base) MCG/ACT inhaler Inhale 1 puff into the lungs as needed.          Review of Systems     Objective:   Physical Exam     amb wf nad    Wt Readings from Last 3 Encounters:  02/24/17 161 lb (73 kg)  02/02/17 154 lb (69.9 kg)  06/21/16 159 lb 12.8 oz (72.5 kg)     Vital signs reviewed - Note on arrival 02 sats  97% on RA      HEENT: nl dentition, turbinates bilaterally, and oropharynx. Nl external ear canals without cough reflex   NECK :  without JVD/Nodes/TM/ nl carotid upstrokes bilaterally   LUNGS: no acc muscle use,  Nl contour chest which is clear to A and P bilaterally without cough on insp or exp maneuvers   CV:  RRR  no s3 or murmur or increase in P2, and no edema   ABD:  soft and nontender with nl inspiratory excursion in the supine position. No bruits or organomegaly appreciated, bowel sounds nl  MS:  Nl gait/ ext warm without deformities, calf tenderness, cyanosis or clubbing No obvious joint restrictions   SKIN: warm and dry without lesions    NEURO:  alert, approp, nl sensorium with  no motor or cerebellar deficits apparent.       I personally reviewed images and agree with radiology impression as follows:  CXR:    01/31/17  Mild peribronchial cuffing, nonspecific, consider reactive airways disease or bronchitis. Otherwise no active disease in the chest.   Labs ordered/ reviewed:      Chemistry      Component Value Date/Time   NA 138 02/01/2017 0308   K 3.8 02/01/2017 0308    CL 107 02/01/2017 0308   CO2 26 02/01/2017 0308   BUN 14 02/01/2017 0308   CREATININE 0.65 02/01/2017 0308   CREATININE 0.88 06/21/2016 1308      Component Value Date/Time   CALCIUM 8.2 (L) 02/01/2017 0308   ALKPHOS 69 06/21/2016 1308   AST 20 06/21/2016 1308   ALT 14 06/21/2016 1308   BILITOT 0.4 06/21/2016 1308        Lab Results  Component Value Date   WBC 6.0 02/24/2017   HGB 13.8 02/24/2017   HCT 42.0 02/24/2017   MCV 89.2 02/24/2017   PLT 196.0 02/24/2017       Eos                        0.3  02/24/2017     Lab Results  Component Value Date   TSH 0.99 06/21/2016     Lab Results  Component Value Date   PROBNP 26.0 02/24/2017      Labs ordered 02/24/2017  Allergy profile/ alpha one screen       Assessment:

## 2017-02-24 NOTE — Patient Instructions (Addendum)
Pantoprazole (protonix) 40 mg   Take  30-60 min before first meal of the day and Pepcid (famotidine)  20 mg one @  bedtime until return to office - this is the best way to tell whether stomach acid is contributing to your problem.    GERD (REFLUX)  is an extremely common cause of respiratory symptoms just like yours , many times with no obvious heartburn at all.    It can be treated with medication, but also with lifestyle changes including elevation of the head of your bed (ideally with 6 inch  bed blocks),  Smoking cessation, avoidance of late meals, excessive alcohol, and avoid fatty foods, chocolate, peppermint, colas, red wine, and acidic juices such as orange juice.  NO MINT OR MENTHOL PRODUCTS SO NO COUGH DROPS   USE SUGARLESS CANDY INSTEAD (Jolley ranchers or Stover's or Life Savers) or even ice chips will also do - the key is to swallow to prevent all throat clearing. NO OIL BASED VITAMINS - use powdered substitutes.    Plan A = Automatic =  Symbicort 160 Take 2 puffs first thing in am and then another 2 puffs about 12 hours later.  continue singulair for now   Work on inhaler technique:  relax and gently blow all the way out then take a nice smooth deep breath back in, triggering the inhaler at same time you start breathing in.  Hold for up to 5 seconds if you can. Blow out thru nose. Rinse and gargle with water when done      Plan B = Backup Only use your albuterol (Proair) as a rescue medication to be used if you can't catch your breath by resting or doing a relaxed purse lip breathing pattern.  - The less you use it, the better it will work when you need it. - Ok to use the inhaler up to 2 puffs  every 4 hours if you must but call for appointment if use goes up over your usual need - Don't leave home without it !!  (think of it like the spare tire for your car)    Please remember to go to the lab department downstairs in the basement  for your tests - we will call you with the  results when they are available.       Please schedule a follow up office visit in 4 weeks, sooner if needed with pfts and cxr on return

## 2017-02-25 ENCOUNTER — Encounter: Payer: Self-pay | Admitting: Internal Medicine

## 2017-02-25 DIAGNOSIS — J449 Chronic obstructive pulmonary disease, unspecified: Secondary | ICD-10-CM | POA: Insufficient documentation

## 2017-02-25 NOTE — Assessment & Plan Note (Addendum)
Spirometry 02/24/2017  FEV1 0.93 (28%)  Ratio 81 poor tracing for f/v but curved on effort indep portion - 02/24/2017  After extensive coaching inhaler device  effectiveness =    50% > try symb 160 2bid    So I suspect she has copd but can't really stage it based on today's study but hx is c/w copd with AB component so   rec full month of symb 160 2bid and no smoking x one month then regroup with full pfts and cxr and confirm adequate hfa or try alternative like dpi

## 2017-02-28 ENCOUNTER — Telehealth: Payer: Self-pay | Admitting: Internal Medicine

## 2017-02-28 NOTE — Telephone Encounter (Signed)
Notes recorded by Tanda Rockers, MD on 02/26/2017 at 5:15 AM EST Call patient : Studies are c/w allergies to cat > dog rec avoidance, esp in bedroom ------------------------------------------ Spoke with pt. She is aware of results. Nothing further was needed.

## 2017-02-28 NOTE — Progress Notes (Signed)
LMTCB

## 2017-03-02 NOTE — Progress Notes (Signed)
Spoke with pt and notified of results per Dr. Wert. Pt verbalized understanding and denied any questions. 

## 2017-03-15 LAB — RESPIRATORY ALLERGY PROFILE REGION II ~~LOC~~
ALLERGEN, D PTERNOYSSINUS, D1: 0.12 kU/L — AB
Allergen, A. alternata, m6: <0.1 kU/L
Allergen, Comm Silver Birch, t9: <0.1 kU/L
Allergen, Cottonwood, t14: <0.1 kU/L
Allergen, Mouse Urine Protein, e78: 0.33 kU/L — ABNORMAL HIGH
Bermuda Grass: <0.1 kU/L
Box Elder IgE: <0.1 kU/L
CAT DANDER: 30.6 kU/L — AB
CLADOSPORIUM HERBARUM (M2) IGE: <0.1 kU/L
CLASS: 0
CLASS: 0
CLASS: 0
CLASS: 0
CLASS: 0
CLASS: 0
CLASS: 0
CLASS: 0
CLASS: 0
CLASS: 0
COCKROACH: 0.1 kU/L — AB
COMMON RAGWEED (SHORT) (W1) IGE: <0.1 kU/L
Class: 0
Class: 0
Class: 0
Class: 0
Class: 0
Class: 0
Class: 0
Class: 0
Class: 0
Class: 0
Class: 0
Class: 0
Class: 3
Class: 4
Dog Dander: 8.53 kU/L — ABNORMAL HIGH
Elm IgE: <0.1 kU/L
IGE (IMMUNOGLOBULIN E), SERUM: 206 kU/L — AB (ref <=114)
Pecan/Hickory Tree IgE: <0.1 kU/L
Sheep Sorrel IgE: <0.1 kU/L
Timothy Grass: <0.1 kU/L

## 2017-03-15 LAB — ALPHA-1 ANTITRYPSIN PHENOTYPE: A1 ANTITRYPSIN SER: 147 mg/dL (ref 83–199)

## 2017-03-15 LAB — ALPHA-1-ANTITRYPSIN: A-1 Antitrypsin, Ser: 144 mg/dL (ref 83–199)

## 2017-03-15 LAB — D-DIMER, QUANTITATIVE: D-Dimer, Quant: 0.42 mcg/mL FEU (ref <0.50)

## 2017-03-15 LAB — INTERPRETATION:

## 2017-03-25 ENCOUNTER — Ambulatory Visit (INDEPENDENT_AMBULATORY_CARE_PROVIDER_SITE_OTHER): Payer: BLUE CROSS/BLUE SHIELD | Admitting: Internal Medicine

## 2017-03-25 ENCOUNTER — Ambulatory Visit: Payer: BLUE CROSS/BLUE SHIELD | Admitting: Internal Medicine

## 2017-03-25 ENCOUNTER — Encounter: Payer: Self-pay | Admitting: Internal Medicine

## 2017-03-25 VITALS — BP 112/70 | HR 62 | Ht 69.75 in | Wt 164.0 lb

## 2017-03-25 DIAGNOSIS — J449 Chronic obstructive pulmonary disease, unspecified: Secondary | ICD-10-CM | POA: Diagnosis not present

## 2017-03-25 DIAGNOSIS — R0609 Other forms of dyspnea: Secondary | ICD-10-CM | POA: Diagnosis not present

## 2017-03-25 LAB — PULMONARY FUNCTION TEST
DL/VA % pred: 80 %
DL/VA: 4.37 ml/min/mmHg/L
DLCO COR: 23.83 ml/min/mmHg
DLCO UNC % PRED: 75 %
DLCO UNC: 24.12 ml/min/mmHg
DLCO cor % pred: 74 %
FEF 25-75 PRE: 1.2 L/s
FEF 25-75 Post: 1.76 L/sec
FEF2575-%CHANGE-POST: 46 %
FEF2575-%PRED-PRE: 41 %
FEF2575-%Pred-Post: 60 %
FEV1-%Change-Post: 10 %
FEV1-%PRED-POST: 75 %
FEV1-%PRED-PRE: 67 %
FEV1-POST: 2.45 L
FEV1-Pre: 2.21 L
FEV1FVC-%CHANGE-POST: 1 %
FEV1FVC-%Pred-Pre: 82 %
FEV6-%CHANGE-POST: 11 %
FEV6-%PRED-PRE: 81 %
FEV6-%Pred-Post: 90 %
FEV6-Post: 3.66 L
FEV6-Pre: 3.28 L
FEV6FVC-%Change-Post: 0 %
FEV6FVC-%Pred-Post: 101 %
FEV6FVC-%Pred-Pre: 101 %
FVC-%Change-Post: 9 %
FVC-%Pred-Post: 88 %
FVC-%Pred-Pre: 81 %
FVC-Post: 3.71 L
FVC-Pre: 3.39 L
POST FEV6/FVC RATIO: 99 %
PRE FEV1/FVC RATIO: 65 %
Post FEV1/FVC ratio: 66 %
Pre FEV6/FVC Ratio: 98 %
RV % pred: 99 %
RV: 2.17 L
TLC % pred: 95 %
TLC: 5.67 L

## 2017-03-25 MED ORDER — BUDESONIDE-FORMOTEROL FUMARATE 80-4.5 MCG/ACT IN AERO: 2.0000 | INHALATION_SPRAY | Freq: Two times a day (BID) | RESPIRATORY_TRACT | 0 refills | Status: DC

## 2017-03-25 MED ORDER — BUDESONIDE-FORMOTEROL FUMARATE 80-4.5 MCG/ACT IN AERO: 2.0000 | INHALATION_SPRAY | Freq: Two times a day (BID) | RESPIRATORY_TRACT | 11 refills | Status: DC

## 2017-03-25 NOTE — Patient Instructions (Signed)
No change in medications   See dr Watt Climes re your continued indigestion   Please schedule a follow up office visit in 6 weeks, call sooner if needed with all medications /inhalers/ solutions in hand so we can verify exactly what you are taking. This includes all medications from all doctors and over the counters

## 2017-03-25 NOTE — Progress Notes (Signed)
Subjective:     Patient ID: Rachael Peters, female   DOB: [birth year],   MRN: [id number]  HPI  57 yowf  MM quit smoking  Dec 14th 2018 with variable sob requiring multiple ERV so referred to pulmonary clinic 02/24/2017 by EDP    02/24/2017 1st [facility] office visit/ Rachael Peters   Chief Complaint  Patient presents with  . Pulmonary Consult    Self referral. Pt c/o dyspnea for the past several years. She states that she started having more trouble after developed influenza Dec 2018. She states she gets winded with exercise and occ after eating. She has an albuterol inhaler that she uses 2 x per wk on average, "sometimes not at all".   no trouble with breathing during childhood or 28 week IUP with new sob x 5 years prior to OV  - whereas used to be able to jog and still able to do treadmill and eliptical s difficulty with declining tol x 2 years including 2 days prior to OV  And also bad attacks that aren't directly related to exertion ? Related to eating -  better with saba but sometimes not and had to go to ER x 3 -  Severe episode Dec 15 with flu like symptoms >     Admit date: 01/30/2017 Discharge date: 02/02/2017   Discharge Diagnoses:  Principal Problem:   Acute respiratory failure with hypoxia ([place]) Active Problems:   Asthma exacerbation   Sepsis ([place])   Influenza   Anxiety   Depression with anxiety   Bronchitis    History of present illness:  Patient is a 57 year old female history of asthma, depression, anxiety, history of intermittent tobacco use over the past 20-30 years presented with fever body aches cough and shortness of breath. Patient also with complaints of productive cough of greenish sputum, chest tightness. Patient patient had been seen at the urgent care center after his symptoms are started 4 days prior to admission and was noted to be positive for the flu and discharged home on Tamiflu. Patient had taken only 3 doses prior to admission. On admission  patient was noted to have a leukocytosis, tachycardia, tachypnea, oxygen sats of 85% on room air  Hospital Course:   Acute respiratory failure with hypoxia secondary toasthma exacerbation and influenza -she was influenza positive prior to admission -improved, treated with BIPAP, Iv solumedrol, Abx, nebs -discharged home in stable condition to complete course of tamiflu and prednisone taper  2. Influenza -improving, day4 ofTamiflu now  3. Depression/anxiety Stable. Continue Lexapro. Xanax PRN continued   At home since d/c continues with doe but MMRC1 = can walk nl pace, flat grade, can't hurry or go uphills or steps s sob  / rare noct need for saba/ mostly daytime  rec Pantoprazole (protonix) 40 mg   Take  30-60 min before first meal of the day and Pepcid (famotidine)  20 mg one @  bedtime until return to office - this is the best way to tell whether stomach acid is contributing to your problem.   GERD diet  Plan A = Automatic =  Symbicort 160 Take 2 puffs first thing in am and then another 2 puffs about 12 hours later.  continue singulair for now Work on inhaler technique:  Plan B = Backup Only use your albuterol (Proair) as a rescue medication       03/25/2017  f/u ov/Rachael Peters re:  GOLD II copd  Chief Complaint  Patient presents with  . Follow-up  PFTs today, feeling better but still having SOB  treadmill up to 30 min s stopping varies speed up to 5 mpm Cough: not problem Sleep: no resp c/o's   Still having overt hb/ indigestion even on gerd rx     No obvious day to day or daytime variability or assoc excess/ purulent sputum or mucus plugs or hemoptysis or cp or chest tightness, subjective wheeze or overt sinus   symptoms. No unusual exposure hx or h/o childhood pna/ asthma or knowledge of premature birth.  Sleeping ok flat without nocturnal  or early am exacerbation  of respiratory  c/o's or need for noct saba. Also denies any obvious fluctuation of symptoms with  weather or environmental changes or other aggravating or alleviating factors except as outlined above   Current Allergies, Complete Past Medical History, Past Surgical History, Family History, and Social History were reviewed in [employer] record.  ROS  The following are not active complaints unless bolded Hoarseness, sore throat, dysphagia, dental problems, itching, sneezing,  nasal congestion or discharge of excess mucus or purulent secretions, ear ache,   fever, chills, sweats, unintended wt loss or wt gain, classically pleuritic or exertional cp,  orthopnea pnd or leg swelling, presyncope, palpitations, abdominal pain, anorexia, nausea, vomiting, diarrhea  or change in bowel habits or change in bladder habits, change in stools or change in urine, dysuria, hematuria,  rash, arthralgias, visual complaints, headache, numbness, weakness or ataxia or problems with walking or coordination,  change in mood/affect or memory.        Current Meds  Medication Sig  . ALPRAZolam (XANAX) 0.25 MG tablet Take 0.25 mg by mouth every 8 (eight) hours as needed (anxiety).   . budesonide-formoterol (SYMBICORT) 80-4.5 MCG/ACT inhaler Inhale 2 puffs into the lungs 2 (two) times daily.  Marland Kitchen escitalopram (LEXAPRO) 5 MG tablet Take 10 mg by mouth daily.   . famotidine (PEPCID) 20 MG tablet One at bedtime  . montelukast (SINGULAIR) 10 MG tablet Take 10 mg by mouth as needed.   . pantoprazole (PROTONIX) 40 MG tablet Take 1 tablet (40 mg total) by mouth daily. Take 30-60 min before first meal of the day  . VENTOLIN HFA 108 (90 Base) MCG/ACT inhaler Inhale 1 puff into the lungs as needed.             Objective:   Physical Exam     amb wf / all smiles  03/25/2017          164   02/24/17 161 lb (73 kg)  02/02/17 154 lb (69.9 kg)  06/21/16 159 lb 12.8 oz (72.5 kg)     Vital signs reviewed - Note on arrival 02 sats  98% on RA       HEENT: nl dentition, turbinates bilaterally, and  oropharynx. Nl external ear canals without cough reflex   NECK :  without JVD/Nodes/TM/ nl carotid upstrokes bilaterally   LUNGS: no acc muscle use,  Nl contour chest which is clear to A and P bilaterally without cough on insp or exp maneuvers   CV:  RRR  no s3 or murmur or increase in P2, and no edema   ABD:  soft and nontender with nl inspiratory excursion in the supine position. No bruits or organomegaly appreciated, bowel sounds nl  MS:  Nl gait/ ext warm without deformities, calf tenderness, cyanosis or clubbing No obvious joint restrictions   SKIN: warm and dry without lesions    NEURO:  alert, approp, nl sensorium  with  no motor or cerebellar deficits apparent.             Assessment:

## 2017-03-25 NOTE — Progress Notes (Signed)
PFT done today. 

## 2017-03-26 ENCOUNTER — Encounter: Payer: Self-pay | Admitting: Internal Medicine

## 2017-03-26 NOTE — Assessment & Plan Note (Signed)
Spirometry 02/24/2017  FEV1 0.93 (28%)  Ratio 81 poor tracing for f/v but curved on effort indep portion - 02/24/2017   try symb 160 2bid   - Allergy profile 02/24/17 >  Eos 0.3 /  IgE  206 RAST pos cat > dog - Alpha one AT screen  02/24/17  Level 144  MM  - PFT's  03/25/2017  FEV1 2.45  (75 % ) ratio 66  p 10  % improvement from saba p 10 prior to study with DLCO  75 % corrects to 76 % for alv volume  s symbicort am of visit  - 03/25/2017  After extensive coaching inhaler device  effectiveness =    75% > continue symb 160 2bid   Marked improvement in doe and fev1 off cigs and on symb 160 typical of copd/AB or ACOS which is treated for now the same as AB so no change in rx needed    I had an extended discussion with the patient reviewing all relevant studies completed to date and  lasting 15 to 20 minutes of a 25 minute visit    Each maintenance medication was reviewed in detail including most importantly the difference between maintenance and prns and under what circumstances the prns are to be triggered using an action plan format that is not reflected in the computer generated alphabetically organized AVS.    Please see AVS for specific instructions unique to this visit that I personally wrote and verbalized to the the pt in detail and then reviewed with pt  by my nurse highlighting any  changes in therapy recommended at today's visit to their plan of care.

## 2017-03-28 ENCOUNTER — Telehealth: Payer: Self-pay | Admitting: Internal Medicine

## 2017-03-28 NOTE — Telephone Encounter (Signed)
During pt's OV with MW on 2.8.2019 pt's Symbicort rx was printed and given to another pt. The other pt called in stating she was given Ms. Gilbert's Symbicort rx. The other pt did not want to drive to the office to drop off rx so pt shredded the rx at home.   LMTCB for pt.

## 2017-03-29 MED ORDER — BUDESONIDE-FORMOTEROL FUMARATE 80-4.5 MCG/ACT IN AERO: 2.0000 | INHALATION_SPRAY | Freq: Two times a day (BID) | RESPIRATORY_TRACT | 11 refills | Status: DC

## 2017-03-29 NOTE — Telephone Encounter (Signed)
Pt returned call. Informed her of the status of the previous rx. Advised pt of the actions in place to correct future error. New rx sent to preferred pharmacy. Pt verbalized understanding and denied any further questions or concerns at this time.

## 2017-04-08 ENCOUNTER — Other Ambulatory Visit: Payer: Self-pay | Admitting: Obstetrics and Gynecology

## 2017-04-08 DIAGNOSIS — Z1231 Encounter for screening mammogram for malignant neoplasm of breast: Secondary | ICD-10-CM

## 2017-05-05 ENCOUNTER — Ambulatory Visit
Admission: RE | Admit: 2017-05-05 | Discharge: 2017-05-05 | Disposition: A | Discharge status: Home / Self Care | Payer: BLUE CROSS/BLUE SHIELD | Source: Ambulatory Visit | Attending: Obstetrics and Gynecology | Admitting: Obstetrics and Gynecology

## 2017-05-05 DIAGNOSIS — Z1231 Encounter for screening mammogram for malignant neoplasm of breast: Secondary | ICD-10-CM

## 2017-05-12 ENCOUNTER — Ambulatory Visit: Payer: BLUE CROSS/BLUE SHIELD | Admitting: Internal Medicine

## 2017-05-29 ENCOUNTER — Other Ambulatory Visit: Payer: Self-pay | Admitting: Internal Medicine

## 2017-09-05 NOTE — Progress Notes (Signed)
57 y.o. G29P0100 Married Caucasian female here for annual exam.    Struggling with her weight, hair loss, and not sleeping. Sees an acupuncturist who recommended hormonal testing.  She wants testing even if insurance does not pay for it.   No vaginal bleeding.   Still smoking but trying to stop.  Has stopped working to care for her father.  Stopped her Lexapro when she stopped working.  Labs done through work in past.   PCP:   Leonard Downing, MD   Patient's last menstrual period was 02/15/2013 (approximate).           Sexually active: Yes.    The current method of family planning is post menopausal status.    Exercising: Yes.    strength training, cardipo  Smoker:  Former smoker   Health Maintenance: Pap:  06-21-16 negative, HR HPV negative          06-16-15 negative, HR HPV negative  History of abnormal Pap:  yes MMG:  05-06-17 density C/BIRADS 1 negative  Colonoscopy:  2014 normal with [facility], next due 2024  BMD:   2016  Result  Normal with Dr. Carren Rang  TDaP:  PCP HIV: 01-30-17 negative  Hep C: 5-7- 18 negative  Screening Labs:  Hb today: discuss with provider, Urine today: not collected   reports that she has quit smoking. Her smoking use included cigarettes. She started smoking about 7 months ago. She has a 5.00 pack-year smoking history. She has never used smokeless tobacco. She reports that she drinks about 1.2 oz of alcohol per week. She reports that she does not use drugs.  Past Medical History:  Diagnosis Date  . Abnormal Pap smear of cervix 1990   hx cervical conization for abnormal pap--paps normal since  . Anxiety   . Arthritis   . Asthma   . Cat allergies     Past Surgical History:  Procedure Laterality Date  . APPENDECTOMY    . CERVICAL CONIZATION W/BX    . CERVIX LESION DESTRUCTION  1990    Current Outpatient Medications  Medication Sig Dispense Refill  . budesonide-formoterol (SYMBICORT) 80-4.5 MCG/ACT inhaler Inhale 2 puffs into the lungs 2  (two) times daily. 1 Inhaler 11  . montelukast (SINGULAIR) 10 MG tablet Take 10 mg by mouth as needed.   2  . NON FORMULARY Herb for hot flashes    . pantoprazole (PROTONIX) 40 MG tablet TAKE 1 TABLET (40 MG TOTAL) BY MOUTH DAILY. TAKE 30-60 MIN BEFORE FIRST MEAL OF THE DAY 30 tablet 2  . VENTOLIN HFA 108 (90 Base) MCG/ACT inhaler Inhale 1 puff into the lungs as needed.     No current facility-administered medications for this visit.     Family History  Problem Relation Age of Onset  . Heart disease Maternal Grandmother   . Heart disease Maternal Grandfather   . Diabetes Maternal Grandfather   . Heart disease Paternal Grandmother   . Heart disease Paternal Grandfather   . Breast cancer Mother 60  . Breast cancer Sister 44  . Heart disease Father   . Stroke Father   . Heart attack Father     Review of Systems  Constitutional: Positive for unexpected weight change.  HENT: Negative.   Eyes: Negative.   Respiratory: Negative.   Cardiovascular: Negative.   Gastrointestinal: Negative.   Endocrine: Negative.   Genitourinary: Negative.   Musculoskeletal: Negative.   Skin:       Hair loss  Allergic/Immunologic: Negative.  Neurological:       Insomnia  Hematological: Negative.   Psychiatric/Behavioral: Negative.     Exam:   BP 130/80 (BP Location: Right Arm, Patient Position: Sitting, Cuff Size: Normal)   Pulse 66   Resp 14   Ht 5' 9.5" (1.765 m)   Wt 162 lb 6.4 oz (73.7 kg)   LMP 02/15/2013 (Approximate)   BMI 23.64 kg/m     General appearance: alert, cooperative and appears stated age Head: Normocephalic, without obvious abnormality, atraumatic Neck: no adenopathy, supple, symmetrical, trachea midline and thyroid normal to inspection and palpation Lungs: clear to auscultation bilaterally Breasts: normal appearance, no masses or tenderness, No nipple retraction or dimpling, No nipple discharge or bleeding, No axillary or supraclavicular adenopathy Heart: regular rate  and rhythm Abdomen: soft, non-tender; no masses, no organomegaly Extremities: extremities normal, atraumatic, no cyanosis or edema Skin: Skin color, texture, turgor normal. No rashes or lesions Lymph nodes: Cervical, supraclavicular, and axillary nodes normal. No abnormal inguinal nodes palpated Neurologic: Grossly normal  Pelvic: External genitalia:  no lesions              Urethra:  normal appearing urethra with no masses, tenderness or lesions              Bartholins and Skenes: normal                 Vagina: normal appearing vagina with normal color and discharge, no lesions              Cervix: no lesions              Pap taken: No. Bimanual Exam:  Uterus:  normal size, contour, position, consistency, mobility, non-tender              Adnexa: no mass, fullness, tenderness              Rectal exam: Yes.  .  Confirms.              Anus:  normal sphincter tone, no lesions  TC model for breast cancer risk - 22.3%.  Chaperone was present for exam.  Assessment:   Well woman visit with normal exam. Menopausal female. Symptomatic. Off Lexapro. FH of breast cancer in mother and sister. Sister is BRCA negative.  High risk for breast cancer. Remote hx of cervical conization.  FH CAD.  Smoker.   Plan: Mammogram screening. Recommended self breast awareness. Pap and HR HPV as above. Guidelines for Calcium, Vitamin D, regular exercise program including cardiovascular and weight bearing exercise. Plan for breast MRI.  Will check CBC, ferritin, cortisol, TFTs and routine labs.  Follow up annually and prn.   After visit summary provided.

## 2017-09-06 ENCOUNTER — Encounter: Payer: Self-pay | Admitting: Obstetrics and Gynecology

## 2017-09-06 ENCOUNTER — Other Ambulatory Visit: Payer: Self-pay | Admitting: *Deleted

## 2017-09-06 ENCOUNTER — Ambulatory Visit: Payer: BLUE CROSS/BLUE SHIELD | Admitting: Obstetrics and Gynecology

## 2017-09-06 ENCOUNTER — Other Ambulatory Visit: Payer: Self-pay

## 2017-09-06 VITALS — BP 130/80 | HR 66 | Resp 14 | Ht 69.5 in | Wt 162.4 lb

## 2017-09-06 DIAGNOSIS — L659 Nonscarring hair loss, unspecified: Secondary | ICD-10-CM | POA: Diagnosis not present

## 2017-09-06 DIAGNOSIS — Z01419 Encounter for gynecological examination (general) (routine) without abnormal findings: Secondary | ICD-10-CM | POA: Diagnosis not present

## 2017-09-06 DIAGNOSIS — Z1231 Encounter for screening mammogram for malignant neoplasm of breast: Secondary | ICD-10-CM | POA: Diagnosis not present

## 2017-09-06 DIAGNOSIS — R635 Abnormal weight gain: Secondary | ICD-10-CM

## 2017-09-06 NOTE — Patient Instructions (Signed)

## 2017-09-07 LAB — CBC
HEMOGLOBIN: 14.2 g/dL (ref 11.1–15.9)
Hematocrit: 43.3 % (ref 34.0–46.6)
MCH: 28.8 pg (ref 26.6–33.0)
MCHC: 32.8 g/dL (ref 31.5–35.7)
MCV: 88 fL (ref 79–97)
Platelets: 212 10*3/uL (ref 150–450)
RBC: 4.93 x10E6/uL (ref 3.77–5.28)
RDW: 15.2 % (ref 12.3–15.4)
WBC: 4.8 10*3/uL (ref 3.4–10.8)

## 2017-09-07 LAB — COMPREHENSIVE METABOLIC PANEL
A/G RATIO: 2.1 (ref 1.2–2.2)
ALBUMIN: 4.9 g/dL (ref 3.5–5.5)
ALK PHOS: 72 IU/L (ref 39–117)
ALT: 15 IU/L (ref 0–32)
AST: 18 IU/L (ref 0–40)
BILIRUBIN TOTAL: 0.3 mg/dL (ref 0.0–1.2)
BUN / CREAT RATIO: 14 (ref 9–23)
BUN: 12 mg/dL (ref 6–24)
CHLORIDE: 102 mmol/L (ref 96–106)
CO2: 27 mmol/L (ref 20–29)
Calcium: 9.5 mg/dL (ref 8.7–10.2)
Creatinine, Ser: 0.85 mg/dL (ref 0.57–1.00)
GFR calc Af Amer: 89 mL/min/{1.73_m2} (ref >59)
GFR calc non Af Amer: 77 mL/min/{1.73_m2} (ref >59)
GLUCOSE: 83 mg/dL (ref 65–99)
Globulin, Total: 2.3 g/dL (ref 1.5–4.5)
POTASSIUM: 3.9 mmol/L (ref 3.5–5.2)
SODIUM: 143 mmol/L (ref 134–144)
Total Protein: 7.2 g/dL (ref 6.0–8.5)

## 2017-09-07 LAB — LIPID PANEL
CHOLESTEROL TOTAL: 133 mg/dL (ref 100–199)
Chol/HDL Ratio: 2.2 ratio (ref 0.0–4.4)
HDL: 61 mg/dL (ref >39)
LDL CALC: 55 mg/dL (ref 0–99)
TRIGLYCERIDES: 84 mg/dL (ref 0–149)
VLDL CHOLESTEROL CAL: 17 mg/dL (ref 5–40)

## 2017-09-07 LAB — TESTOSTERONE: Testosterone: 10 ng/dL (ref 3–41)

## 2017-09-07 LAB — CORTISOL: CORTISOL: 7.4 ug/dL

## 2017-09-07 LAB — FERRITIN: Ferritin: 56 ng/mL (ref 15–150)

## 2017-09-07 LAB — T4, FREE: Free T4: 1.4 ng/dL (ref 0.82–1.77)

## 2017-09-07 LAB — VITAMIN D 25 HYDROXY (VIT D DEFICIENCY, FRACTURES): VIT D 25 HYDROXY: 38.5 ng/mL (ref 30.0–100.0)

## 2017-09-07 LAB — TSH: TSH: 1.08 u[IU]/mL (ref 0.450–4.500)

## 2017-09-26 ENCOUNTER — Telehealth: Payer: Self-pay | Admitting: Obstetrics and Gynecology

## 2017-09-26 NOTE — Telephone Encounter (Signed)
Spoke with patient regarding recommended breast MRI. Advised patient MRI is approved Josem Kaufmann # [id number]) and this approval is valid through 10/11/17. Encouraged patient to call for scheduling. Patient advises her 55 year old father fell last week and "cracked his pelvis". Patient has been assisting her mother in caring for her father. Patient advises she will schedule MRI appointment when her father is stable.  Forwarding to Dr Quincy Simmonds. Will close phone encounter, as referral remains in the WQ.

## 2017-09-30 ENCOUNTER — Inpatient Hospital Stay: Admission: RE | Admit: 2017-09-30 | Payer: BLUE CROSS/BLUE SHIELD | Source: Ambulatory Visit

## 2017-10-07 ENCOUNTER — Ambulatory Visit
Admission: RE | Admit: 2017-10-07 | Discharge: 2017-10-07 | Disposition: A | Discharge status: Home / Self Care | Payer: BLUE CROSS/BLUE SHIELD | Source: Ambulatory Visit | Attending: Obstetrics and Gynecology | Admitting: Obstetrics and Gynecology

## 2017-10-07 DIAGNOSIS — Z1231 Encounter for screening mammogram for malignant neoplasm of breast: Secondary | ICD-10-CM

## 2017-10-31 ENCOUNTER — Other Ambulatory Visit: Payer: Self-pay | Admitting: Urology

## 2017-10-31 DIAGNOSIS — D3000 Benign neoplasm of unspecified kidney: Secondary | ICD-10-CM

## 2017-11-10 ENCOUNTER — Ambulatory Visit
Admission: RE | Admit: 2017-11-10 | Discharge: 2017-11-10 | Disposition: A | Discharge status: Home / Self Care | Payer: BLUE CROSS/BLUE SHIELD | Source: Ambulatory Visit | Attending: Urology | Admitting: Urology

## 2017-11-10 DIAGNOSIS — D3000 Benign neoplasm of unspecified kidney: Secondary | ICD-10-CM

## 2017-11-14 ENCOUNTER — Other Ambulatory Visit: Payer: Self-pay | Admitting: Internal Medicine

## 2017-12-27 ENCOUNTER — Ambulatory Visit (HOSPITAL_COMMUNITY)
Admission: RE | Admit: 2017-12-27 | Discharge: 2017-12-27 | Disposition: A | Discharge status: Home / Self Care | Payer: BLUE CROSS/BLUE SHIELD | Source: Ambulatory Visit | Attending: Urology | Admitting: Urology

## 2017-12-27 ENCOUNTER — Other Ambulatory Visit: Payer: Self-pay | Admitting: Urology

## 2017-12-27 DIAGNOSIS — D49511 Neoplasm of unspecified behavior of right kidney: Secondary | ICD-10-CM | POA: Diagnosis present

## 2018-01-10 ENCOUNTER — Other Ambulatory Visit: Payer: Self-pay | Admitting: Urology

## 2018-01-16 ENCOUNTER — Telehealth: Payer: Self-pay | Admitting: Obstetrics and Gynecology

## 2018-01-16 NOTE — Telephone Encounter (Signed)
Patient states "I have been having some pain and would like to come in for an ultrasound".

## 2018-01-16 NOTE — Telephone Encounter (Signed)
Spoke with patient. Patient reports intermittent  right sided abdominal/pelvic pain that has been ongoing for several months. Urinary frequency and lower back pain for 1.5 months. Reports nausea, bloating, and indigestion. Denies vaginal d/c, bleeding, odor, or fever/chills. Pain is "ok" right now". Scheduled for partial nephrectomy on 02/23/18 for lesion on right kidney.   Has seen GI, negative testing, on Protonix for "acid in stomach", no change in symptoms. Was evaluated for UTI, reports negative for infection. Is established at [facility].   Patient requesting to schedule PUS. Recommended OV with Dr. Quincy Simmonds, scheduled for 12/5 at 1pm. Advised Dr. Quincy Simmonds will review, our office will return call if any additional recommendations.   Routing to provider for final review. Patient is agreeable to disposition. Will close encounter.

## 2018-01-18 NOTE — Progress Notes (Signed)
GYNECOLOGY  VISIT   HPI: 57 y.o.   Married  Caucasian  female   G2P0100 with Patient's last menstrual period was 02/15/2013 (approximate).   here for right sided abdominal pain x 1year. Patient states she has had complete GI work up. Seeing kidney specialist who is following a lesion on kidney.   She is having a partial nephrectomy robotically with Dr. Dutch Gray.   She has abdominal pain for one year.  Has constant bloating, indigestion and nausea. Also has frequent urination, which is more sudden in onset. Has lower back pain and pressure.  Has done a urine culture which was negative.   One Mellow Yellow per day.   Had been under stress, so she thought pain by be due to this.  Had endoscopy and had stomach and esophageal polyps removed and is now treated for reflux.  Dr. Watt Climes.  Urine Dip:Neg  GYNECOLOGIC HISTORY: Patient's last menstrual period was 02/15/2013 (approximate). Contraception:  Postmenopausal Menopausal hormone therapy:  none Last mammogram: 05-06-17 density C/BIRADS 1 negative  Last pap smear:   06-21-16 negative, HR HPV negative          06-16-15 negative, HR HPV negative         OB History    Gravida  2   Para  1   Term      Preterm  1   AB      Living  0     SAB      TAB      Ectopic      Multiple      Live Births                 Patient Active Problem List   Diagnosis Date Noted  . COPD  GOLD II  02/25/2017  . Dyspnea on exertion 02/24/2017  . Bronchitis 01/31/2017  . Asthma exacerbation 01/30/2017  . Sepsis ([place]) 01/30/2017  . Influenza 01/30/2017  . Depression with anxiety 01/30/2017  . Acute respiratory failure with hypoxia ([place]) 01/30/2017  . Anxiety     Past Medical History:  Diagnosis Date  . Abnormal Pap smear of cervix 1990   hx cervical conization for abnormal pap--paps normal since  . Anxiety   . Arthritis   . Asthma   . Cat allergies     Past Surgical History:  Procedure Laterality Date  . APPENDECTOMY     . CERVICAL CONIZATION W/BX    . CERVIX LESION DESTRUCTION  1990    Current Outpatient Medications  Medication Sig Dispense Refill  . budesonide-formoterol (SYMBICORT) 80-4.5 MCG/ACT inhaler Inhale 2 puffs into the lungs 2 (two) times daily. (Patient taking differently: Inhale 2 puffs into the lungs as needed. ) 1 Inhaler 11  . escitalopram (LEXAPRO) 10 MG tablet Take 1 tablet by mouth daily.  12  . montelukast (SINGULAIR) 10 MG tablet Take 10 mg by mouth as needed.   2  . NON FORMULARY Herb for hot flashes    . omeprazole (PRILOSEC) 40 MG capsule Take 1 capsule by mouth daily.  6   No current facility-administered medications for this visit.      ALLERGIES: Codeine  Family History  Problem Relation Age of Onset  . Heart disease Maternal Grandmother   . Heart disease Maternal Grandfather   . Diabetes Maternal Grandfather   . Heart disease Paternal Grandmother   . Heart disease Paternal Grandfather   . Breast cancer Mother 10  . Breast cancer Sister 63  . Heart  disease Father   . Stroke Father   . Heart attack Father     Social History   Socioeconomic History  . Marital status: Married    Spouse name: Not on file  . Number of children: Not on file  . Years of education: Not on file  . Highest education level: Not on file  Occupational History  . Not on file  Social Needs  . Financial resource strain: Not on file  . Food insecurity:    Worry: Not on file    Inability: Not on file  . Transportation needs:    Medical: Not on file    Non-medical: Not on file  Tobacco Use  . Smoking status: Former Smoker    Packs/day: 0.50    Years: 10.00    Pack years: 5.00    Types: Cigarettes    Start date: 01/25/2017  . Smokeless tobacco: Never Used  Substance and Sexual Activity  . Alcohol use: Yes    Alcohol/week: 2.0 standard drinks    Types: 2 Glasses of wine per week    Comment: occ  . Drug use: No  . Sexual activity: Yes    Partners: Male    Birth  control/protection: Post-menopausal  Lifestyle  . Physical activity:    Days per week: Not on file    Minutes per session: Not on file  . Stress: Not on file  Relationships  . Social connections:    Talks on phone: Not on file    Gets together: Not on file    Attends religious service: Not on file    Active member of club or organization: Not on file    Attends meetings of clubs or organizations: Not on file    Relationship status: Not on file  . Intimate partner violence:    Fear of current or ex partner: Not on file    Emotionally abused: Not on file    Physically abused: Not on file    Forced sexual activity: Not on file  Other Topics Concern  . Not on file  Social History Narrative  . Not on file    Review of Systems  Gastrointestinal: Positive for nausea.       Bloating  Genitourinary: Positive for frequency and urgency.  All other systems reviewed and are negative.   PHYSICAL EXAMINATION:    BP 118/70 (BP Location: Right Arm, Patient Position: Sitting, Cuff Size: Normal)   Pulse 70   Temp 97.7 F (36.5 C) (Oral)   Ht 5' 9.5" (1.765 m)   Wt 159 lb (72.1 kg)   LMP 02/15/2013 (Approximate)   BMI 23.14 kg/m     General appearance: alert, cooperative and appears stated age   Abdomen: soft, non-tender, no masses,  no organomegaly   Pelvic: External genitalia:  no lesions              Urethra:  normal appearing urethra with no masses, tenderness or lesions              Bartholins and Skenes: normal                 Vagina: normal appearing vagina with normal color and discharge, no lesions              Cervix: no lesions                Bimanual Exam:  Uterus:  normal size, contour, position, consistency, mobility, non-tender  Adnexa: no mass, fullness, tenderness              Rectal exam: Yes.  .  Confirms.              Anus:  normal sphincter tone, no lesions  Chaperone was present for exam.  ASSESSMENT   Pelvic pressure. Urinary frequency.   Normal urine dip today.  Renal lesion.  Having upcoming partial nephrectomy.   PLAN  Discussion of abdominal pain/pressure and potential etiologies.  Will order pelvic US.  To return to Dr. Karie Georges for further evaluation of urinary frequency.   An After Visit Summary was printed and given to the patient.  ___25___ minutes face to face time of which over 50% was spent in counseling.

## 2018-01-19 ENCOUNTER — Other Ambulatory Visit: Payer: Self-pay

## 2018-01-19 ENCOUNTER — Encounter: Payer: Self-pay | Admitting: Obstetrics and Gynecology

## 2018-01-19 ENCOUNTER — Ambulatory Visit: Payer: BLUE CROSS/BLUE SHIELD | Admitting: Obstetrics and Gynecology

## 2018-01-19 VITALS — BP 118/70 | HR 70 | Temp 97.7°F | Ht 69.5 in | Wt 159.0 lb

## 2018-01-19 DIAGNOSIS — R350 Frequency of micturition: Secondary | ICD-10-CM

## 2018-01-19 DIAGNOSIS — R102 Pelvic and perineal pain: Secondary | ICD-10-CM

## 2018-01-19 LAB — POCT URINALYSIS DIPSTICK
BILIRUBIN UA: NEGATIVE
GLUCOSE UA: NEGATIVE
KETONES UA: NEGATIVE
Leukocytes, UA: NEGATIVE
Nitrite, UA: NEGATIVE
Protein, UA: NEGATIVE
RBC UA: NEGATIVE
Urobilinogen, UA: 0.2 E.U./dL
pH, UA: 5 (ref 5.0–8.0)

## 2018-01-19 NOTE — Progress Notes (Signed)
Patient scheduled while in office for PUS on 01/26/18 at 8am, consult to follow at 8:15am with Dr. Quincy Simmonds. Patient verbalizes understanding and is agreeable.

## 2018-01-20 ENCOUNTER — Telehealth: Payer: Self-pay | Admitting: Obstetrics and Gynecology

## 2018-01-20 NOTE — Telephone Encounter (Signed)
Call placed to convey benefits for ultrasound appointment.

## 2018-01-26 ENCOUNTER — Ambulatory Visit (INDEPENDENT_AMBULATORY_CARE_PROVIDER_SITE_OTHER): Payer: BLUE CROSS/BLUE SHIELD

## 2018-01-26 ENCOUNTER — Other Ambulatory Visit: Payer: Self-pay

## 2018-01-26 ENCOUNTER — Encounter: Payer: Self-pay | Admitting: Obstetrics and Gynecology

## 2018-01-26 ENCOUNTER — Ambulatory Visit: Payer: BLUE CROSS/BLUE SHIELD | Admitting: Obstetrics and Gynecology

## 2018-01-26 VITALS — BP 100/66 | HR 62 | Ht 69.5 in | Wt 160.4 lb

## 2018-01-26 DIAGNOSIS — D219 Benign neoplasm of connective and other soft tissue, unspecified: Secondary | ICD-10-CM | POA: Diagnosis not present

## 2018-01-26 DIAGNOSIS — R140 Abdominal distension (gaseous): Secondary | ICD-10-CM | POA: Diagnosis not present

## 2018-01-26 DIAGNOSIS — R102 Pelvic and perineal pain: Secondary | ICD-10-CM

## 2018-01-26 DIAGNOSIS — R109 Unspecified abdominal pain: Secondary | ICD-10-CM

## 2018-01-26 NOTE — Progress Notes (Signed)
GYNECOLOGY  VISIT   HPI: 57 y.o.   Married  Caucasian  female   G2P0100 with Patient's last menstrual period was 02/15/2013 (approximate).   here for pelvic ultrasound.  Worried about ovarian cancer due to her pain and symptoms.   Having right sided abdominal pain, bloating, pelvic pressure, nausea, and indigestion for one year.  Under care of Dr. Watt Climes.  Being tx for reflux.  Planning colonoscopy next year.   States she has a hx of a "kink" in her colon.  Dx after childbirth.  Ended up having an appendectomy.  Ultimately dx with the colon issue after her colonoscopy was done.   GYNECOLOGIC HISTORY: Patient's last menstrual period was 02/15/2013 (approximate). Contraception: Postmenopausal Menopausal hormone therapy:  none Last mammogram: 05-06-17 density C/BIRADS 1 negative Last pap smear: 06-21-16 negative, HR HPV negative 06-16-15 negative, HR HPV negative         OB History    Gravida  2   Para  1   Term      Preterm  1   AB      Living  0     SAB      TAB      Ectopic      Multiple      Live Births                 Patient Active Problem List   Diagnosis Date Noted  . COPD  GOLD II  02/25/2017  . Dyspnea on exertion 02/24/2017  . Bronchitis 01/31/2017  . Asthma exacerbation 01/30/2017  . Sepsis ([place]) 01/30/2017  . Influenza 01/30/2017  . Depression with anxiety 01/30/2017  . Acute respiratory failure with hypoxia ([place]) 01/30/2017  . Anxiety     Past Medical History:  Diagnosis Date  . Abnormal Pap smear of cervix 1990   hx cervical conization for abnormal pap--paps normal since  . Anxiety   . Arthritis   . Asthma   . Cat allergies     Past Surgical History:  Procedure Laterality Date  . APPENDECTOMY    . CERVICAL CONIZATION W/BX    . CERVIX LESION DESTRUCTION  1990    Current Outpatient Medications  Medication Sig Dispense Refill  . budesonide-formoterol (SYMBICORT) 80-4.5 MCG/ACT inhaler Inhale 2 puffs into the lungs 2  (two) times daily. (Patient taking differently: Inhale 2 puffs into the lungs as needed. ) 1 Inhaler 11  . escitalopram (LEXAPRO) 10 MG tablet Take 1 tablet by mouth daily.  12  . montelukast (SINGULAIR) 10 MG tablet Take 10 mg by mouth as needed.   2  . NON FORMULARY Herb for hot flashes    . omeprazole (PRILOSEC) 40 MG capsule Take 1 capsule by mouth daily.  6   No current facility-administered medications for this visit.      ALLERGIES: Codeine  Family History  Problem Relation Age of Onset  . Heart disease Maternal Grandmother   . Heart disease Maternal Grandfather   . Diabetes Maternal Grandfather   . Heart disease Paternal Grandmother   . Heart disease Paternal Grandfather   . Breast cancer Mother 17  . Breast cancer Sister 46  . Heart disease Father   . Stroke Father   . Heart attack Father     Social History   Socioeconomic History  . Marital status: Married    Spouse name: Not on file  . Number of children: Not on file  . Years of education: Not on file  . Highest  education level: Not on file  Occupational History  . Not on file  Social Needs  . Financial resource strain: Not on file  . Food insecurity:    Worry: Not on file    Inability: Not on file  . Transportation needs:    Medical: Not on file    Non-medical: Not on file  Tobacco Use  . Smoking status: Former Smoker    Packs/day: 0.50    Years: 10.00    Pack years: 5.00    Types: Cigarettes    Start date: 01/25/2017  . Smokeless tobacco: Never Used  Substance and Sexual Activity  . Alcohol use: Yes    Alcohol/week: 2.0 standard drinks    Types: 2 Glasses of wine per week    Comment: occ  . Drug use: No  . Sexual activity: Yes    Partners: Male    Birth control/protection: Post-menopausal  Lifestyle  . Physical activity:    Days per week: Not on file    Minutes per session: Not on file  . Stress: Not on file  Relationships  . Social connections:    Talks on phone: Not on file    Gets  together: Not on file    Attends religious service: Not on file    Active member of club or organization: Not on file    Attends meetings of clubs or organizations: Not on file    Relationship status: Not on file  . Intimate partner violence:    Fear of current or ex partner: Not on file    Emotionally abused: Not on file    Physically abused: Not on file    Forced sexual activity: Not on file  Other Topics Concern  . Not on file  Social History Narrative  . Not on file    Review of Systems  All other systems reviewed and are negative.   PHYSICAL EXAMINATION:    BP 100/66 (BP Location: Right Arm, Patient Position: Sitting, Cuff Size: Normal)   Pulse 62   Ht 5' 9.5" (1.765 m)   Wt 160 lb 6.4 oz (72.8 kg)   LMP 02/15/2013 (Approximate)   BMI 23.35 kg/m     General appearance: alert, cooperative and appears stated age   Uterus with 10 x 14 mm fibroid posterior.  EMS 1.32 mm.  Normal ovaries.  No free fluid.   ASSESSMENT  Fibroid.  Small.  Right sided abdominal pain and pelvic pressure.  Bloating.  Hx colon "kink" Right kidney mass.   PLAN  We discussed her fibroid and I gave her reassurance that this is benign and not the source of her symptoms.   I strongly recommend she follow up with GI again.  We discussed functional issues with organs as the cause of pain and discomfort.  Questions invited and answered.  An After Visit Summary was printed and given to the patient.  __25______ minutes face to face time of which over 50% was spent in counseling.      ADDENDUM: LIMITED BLADDER APPEARS NML

## 2018-02-14 NOTE — Progress Notes (Signed)
CXR 12-28-17 Epic

## 2018-02-14 NOTE — Patient Instructions (Addendum)
Rachael Peters  02/14/2018   Your procedure is scheduled on: 02-23-18    Report to [facility] Main  Entrance     Report to admitting at 5:30AM    Call this number if you have problems the morning of surgery [phone]      Remember: [place]. Do not eat food or drink liquids :After Midnight.   BRUSH YOUR TEETH MORNING OF SURGERY AND RINSE YOUR MOUTH OUT, NO CHEWING GUM CANDY OR MINTS.     Take these medicines the morning of surgery with A SIP OF WATER: SYMBICORT INHALER IF NEEDED                                 You may not have any metal on your body including hair pins and              piercings  Do not wear jewelry, make-up, lotions, powders or perfumes, deodorant             Do not wear nail polish.  Do not shave  48 hours prior to surgery.            Do not bring valuables to the hospital. [place].  Contacts, dentures or bridgework may not be worn into surgery.  Leave suitcase in the car. After surgery it may be brought to your room.                   Please read over the following fact sheets you were given: _____________________________________________________________________             [facility] - Preparing for Surgery Before surgery, you can play an important role.  Because skin is not sterile, your skin needs to be as free of germs as possible.  You can reduce the number of germs on your skin by washing with CHG (chlorahexidine gluconate) soap before surgery.  CHG is an antiseptic cleaner which kills germs and bonds with the skin to continue killing germs even after washing. Please DO NOT use if you have an allergy to CHG or antibacterial soaps.  If your skin becomes reddened/irritated stop using the CHG and inform your nurse when you arrive at Short Stay. Do not shave (including legs and underarms) for at least 48 hours prior  to the first CHG shower.  You may shave your face/neck. Please follow these instructions carefully:  1.  Shower with CHG Soap the night before surgery and the  morning of Surgery.  2.  If you choose to wash your hair, wash your hair first as usual with your  normal  shampoo.  3.  After you shampoo, rinse your hair and body thoroughly to remove the  shampoo.                           4.  Use CHG as you would any other liquid soap.  You can apply chg directly  to the skin and wash                       Gently with a scrungie or clean washcloth.  5.  Apply the CHG Soap to your body ONLY FROM THE NECK DOWN.   Do not use on face/ open                           Wound or open sores. Avoid contact with eyes, ears mouth and genitals (private parts).                       Wash face,  Genitals (private parts) with your normal soap.             6.  Wash thoroughly, paying special attention to the area where your surgery  will be performed.  7.  Thoroughly rinse your body with warm water from the neck down.  8.  DO NOT shower/wash with your normal soap after using and rinsing off  the CHG Soap.                9.  Pat yourself dry with a clean towel.            10.  Wear clean pajamas.            11.  Place clean sheets on your bed the night of your first shower and do not  sleep with pets. Day of Surgery : Do not apply any lotions/deodorants the morning of surgery.  Please wear clean clothes to the hospital/surgery center.  FAILURE TO FOLLOW THESE INSTRUCTIONS MAY RESULT IN THE CANCELLATION OF YOUR SURGERY PATIENT SIGNATURE_________________________________  NURSE SIGNATURE__________________________________  ________________________________________________________________________

## 2018-02-16 ENCOUNTER — Other Ambulatory Visit: Payer: Self-pay

## 2018-02-16 ENCOUNTER — Encounter (HOSPITAL_COMMUNITY)
Admission: RE | Admit: 2018-02-16 | Discharge: 2018-02-16 | Disposition: A | Discharge status: Home / Self Care | Payer: BLUE CROSS/BLUE SHIELD | Source: Ambulatory Visit | Attending: Urology | Admitting: Urology

## 2018-02-16 ENCOUNTER — Encounter (HOSPITAL_COMMUNITY): Payer: Self-pay

## 2018-02-16 DIAGNOSIS — Z01818 Encounter for other preprocedural examination: Secondary | ICD-10-CM | POA: Diagnosis present

## 2018-02-16 HISTORY — DX: Chronic obstructive pulmonary disease, unspecified: J44.9

## 2018-02-16 HISTORY — DX: Other specified postprocedural states: R11.2

## 2018-02-16 HISTORY — DX: Other specified postprocedural states: Z98.890

## 2018-02-16 LAB — CBC
HCT: 44.1 % (ref 36.0–46.0)
Hemoglobin: 13.9 g/dL (ref 12.0–15.0)
MCH: 29.1 pg (ref 26.0–34.0)
MCHC: 31.5 g/dL (ref 30.0–36.0)
MCV: 92.3 fL (ref 80.0–100.0)
Platelets: 173 10*3/uL (ref 150–400)
RBC: 4.78 MIL/uL (ref 3.87–5.11)
RDW: 13.5 % (ref 11.5–15.5)
WBC: 4.1 10*3/uL (ref 4.0–10.5)
nRBC: 0 % (ref 0.0–0.2)

## 2018-02-16 LAB — BASIC METABOLIC PANEL
Anion gap: 9 (ref 5–15)
BUN: 10 mg/dL (ref 6–20)
CO2: 26 mmol/L (ref 22–32)
Calcium: 8.7 mg/dL — ABNORMAL LOW (ref 8.9–10.3)
Chloride: 101 mmol/L (ref 98–111)
Creatinine, Ser: 0.83 mg/dL (ref 0.44–1.00)
GFR calc Af Amer: >60 mL/min (ref >60)
GFR calc non Af Amer: >60 mL/min (ref >60)
GLUCOSE: 96 mg/dL (ref 70–99)
Potassium: 3.8 mmol/L (ref 3.5–5.1)
Sodium: 136 mmol/L (ref 135–145)

## 2018-02-16 LAB — ABO/RH: ABO/RH(D): O POS

## 2018-02-17 ENCOUNTER — Encounter (HOSPITAL_COMMUNITY): Payer: Self-pay | Admitting: Physician Assistant

## 2018-02-23 ENCOUNTER — Ambulatory Visit (HOSPITAL_COMMUNITY)
Admission: RE | Admit: 2018-02-23 | Payer: BLUE CROSS/BLUE SHIELD | Source: Other Acute Inpatient Hospital | Admitting: Urology

## 2018-02-23 ENCOUNTER — Encounter (HOSPITAL_COMMUNITY): Admission: RE | Payer: Self-pay | Source: Other Acute Inpatient Hospital

## 2018-02-23 LAB — TYPE AND SCREEN
ABO/RH(D): O POS
Antibody Screen: NEGATIVE

## 2018-02-23 SURGERY — NEPHRECTOMY, RADICAL, ROBOT-ASSISTED, LAPAROSCOPIC, ADULT
Anesthesia: General | Laterality: Right

## 2018-03-20 ENCOUNTER — Other Ambulatory Visit: Payer: Self-pay | Admitting: Urology

## 2018-04-13 NOTE — Patient Instructions (Signed)
Rachael Peters  04/13/2018   Your procedure is scheduled on: 04-20-18    Report to [facility] Main  Entrance    Report to Admitting at 5:30 AM    Call this number if you have problems the morning of surgery [phone]    Remember: Do not eat food or drink liquids :After Midnight.    BRUSH YOUR TEETH MORNING OF SURGERY AND RINSE YOUR MOUTH OUT, NO CHEWING GUM CANDY OR MINTS.     Take these medicines the morning of surgery with A SIP OF WATER: Montelukast (Singulair), prn. You may also use and bring your eyedrops if needed.                                You may not have any metal on your body including hair pins and              piercings  Do not wear jewelry, make-up, lotions, powders or perfumes, deodorant             Do not wear nail polish.  Do not shave  48 hours prior to surgery.              Do not bring valuables to the hospital. [place].  Contacts, dentures or bridgework may not be worn into surgery.  Leave suitcase in the car. After surgery it may be brought to your room.     Patients discharged the day of surgery will not be allowed to drive home. IF YOU ARE HAVING SURGERY AND GOING HOME THE SAME DAY, YOU MUST HAVE AN ADULT TO DRIVE YOU HOME AND BE WITH YOU FOR 24 HOURS. YOU MAY GO HOME BY TAXI OR UBER OR ORTHERWISE, BUT AN ADULT MUST ACCOMPANY YOU HOME AND STAY WITH YOU FOR 24 HOURS.    Special Instructions: N/A              Please read over the following fact sheets you were given: _____________________________________________________________________             [facility] - Preparing for Surgery Before surgery, you can play an important role.  Because skin is not sterile, your skin needs to be as free of germs as possible.  You can reduce the number of germs on your skin by washing with CHG (chlorahexidine gluconate) soap before surgery.  CHG is an antiseptic cleaner which kills  germs and bonds with the skin to continue killing germs even after washing. Please DO NOT use if you have an allergy to CHG or antibacterial soaps.  If your skin becomes reddened/irritated stop using the CHG and inform your nurse when you arrive at Short Stay. Do not shave (including legs and underarms) for at least 48 hours prior to the first CHG shower.  You may shave your face/neck. Please follow these instructions carefully:  1.  Shower with CHG Soap the night before surgery and the  morning of Surgery.  2.  If you choose to wash your hair, wash your hair first as usual with your  normal  shampoo.  3.  After you shampoo, rinse your hair and body thoroughly to remove the  shampoo.  4.  Use CHG as you would any other liquid soap.  You can apply chg directly  to the skin and wash                       Gently with a scrungie or clean washcloth.  5.  Apply the CHG Soap to your body ONLY FROM THE NECK DOWN.   Do not use on face/ open                           Wound or open sores. Avoid contact with eyes, ears mouth and genitals (private parts).                       Wash face,  Genitals (private parts) with your normal soap.             6.  Wash thoroughly, paying special attention to the area where your surgery  will be performed.  7.  Thoroughly rinse your body with warm water from the neck down.  8.  DO NOT shower/wash with your normal soap after using and rinsing off  the CHG Soap.                9.  Pat yourself dry with a clean towel.            10.  Wear clean pajamas.            11.  Place clean sheets on your bed the night of your first shower and do not  sleep with pets. Day of Surgery : Do not apply any lotions/deodorants the morning of surgery.  Please wear clean clothes to the hospital/surgery center.  FAILURE TO FOLLOW THESE INSTRUCTIONS MAY RESULT IN THE CANCELLATION OF YOUR SURGERY PATIENT SIGNATURE_________________________________  NURSE  SIGNATURE__________________________________  ________________________________________________________________________  WHAT IS A BLOOD TRANSFUSION? Blood Transfusion Information  A transfusion is the replacement of blood or some of its parts. Blood is made up of multiple cells which provide different functions.  Red blood cells carry oxygen and are used for blood loss replacement.  White blood cells fight against infection.  Platelets control bleeding.  Plasma helps clot blood.  Other blood products are available for specialized needs, such as hemophilia or other clotting disorders. BEFORE THE TRANSFUSION  Who gives blood for transfusions?   Healthy volunteers who are fully evaluated to make sure their blood is safe. This is blood bank blood. Transfusion therapy is the safest it has ever been in the practice of medicine. Before blood is taken from a donor, a complete history is taken to make sure that person has no history of diseases nor engages in risky social behavior (examples are intravenous drug use or sexual activity with multiple partners). The donor's travel history is screened to minimize risk of transmitting infections, such as malaria. The donated blood is tested for signs of infectious diseases, such as HIV and hepatitis. The blood is then tested to be sure it is compatible with you in order to minimize the chance of a transfusion reaction. If you or a relative donates blood, this is often done in anticipation of surgery and is not appropriate for emergency situations. It takes many days to process the donated blood. RISKS AND COMPLICATIONS Although transfusion therapy is very safe and saves many lives, the main dangers of transfusion include:   Getting an infectious disease.  Developing a transfusion reaction. This  is an allergic reaction to something in the blood you were given. Every precaution is taken to prevent this. The decision to have a blood transfusion has been  considered carefully by your caregiver before blood is given. Blood is not given unless the benefits outweigh the risks. AFTER THE TRANSFUSION  Right after receiving a blood transfusion, you will usually feel much better and more energetic. This is especially true if your red blood cells have gotten low (anemic). The transfusion raises the level of the red blood cells which carry oxygen, and this usually causes an energy increase.  The nurse administering the transfusion will monitor you carefully for complications. HOME CARE INSTRUCTIONS  No special instructions are needed after a transfusion. You may find your energy is better. Speak with your caregiver about any limitations on activity for underlying diseases you may have. SEEK MEDICAL CARE IF:   Your condition is not improving after your transfusion.  You develop redness or irritation at the intravenous (IV) site. SEEK IMMEDIATE MEDICAL CARE IF:  Any of the following symptoms occur over the next 12 hours:  Shaking chills.  You have a temperature by mouth above 102 F (38.9 C), not controlled by medicine.  Chest, back, or muscle pain.  People around you feel you are not acting correctly or are confused.  Shortness of breath or difficulty breathing.  Dizziness and fainting.  You get a rash or develop hives.  You have a decrease in urine output.  Your urine turns a dark color or changes to pink, red, or brown. Any of the following symptoms occur over the next 10 days:  You have a temperature by mouth above 102 F (38.9 C), not controlled by medicine.  Shortness of breath.  Weakness after normal activity.  The white part of the eye turns yellow (jaundice).  You have a decrease in the amount of urine or are urinating less often.  Your urine turns a dark color or changes to pink, red, or brown. Document Released: 01/30/2000 Document Revised: 04/26/2011 Document Reviewed: 09/18/2007 [facility] Patient Information 2014  [place], [place].  _______________________________________________________________________

## 2018-04-13 NOTE — Progress Notes (Signed)
02-16-18 (Epic) EKG  12-27-17  (Epic) CXR

## 2018-04-14 ENCOUNTER — Encounter (HOSPITAL_COMMUNITY)
Admission: RE | Admit: 2018-04-14 | Discharge: 2018-04-14 | Disposition: A | Discharge status: Home / Self Care | Payer: BLUE CROSS/BLUE SHIELD | Source: Ambulatory Visit | Attending: Urology | Admitting: Urology

## 2018-04-14 ENCOUNTER — Encounter (HOSPITAL_COMMUNITY): Payer: Self-pay

## 2018-04-14 ENCOUNTER — Other Ambulatory Visit: Payer: Self-pay

## 2018-04-14 DIAGNOSIS — Z01812 Encounter for preprocedural laboratory examination: Secondary | ICD-10-CM | POA: Diagnosis present

## 2018-04-14 LAB — BASIC METABOLIC PANEL
ANION GAP: 5 (ref 5–15)
BUN: 16 mg/dL (ref 6–20)
CO2: 28 mmol/L (ref 22–32)
Calcium: 8.8 mg/dL — ABNORMAL LOW (ref 8.9–10.3)
Chloride: 105 mmol/L (ref 98–111)
Creatinine, Ser: 0.86 mg/dL (ref 0.44–1.00)
GFR calc Af Amer: >60 mL/min (ref >60)
GFR calc non Af Amer: >60 mL/min (ref >60)
Glucose, Bld: 87 mg/dL (ref 70–99)
POTASSIUM: 4.1 mmol/L (ref 3.5–5.1)
Sodium: 138 mmol/L (ref 135–145)

## 2018-04-14 LAB — CBC
HCT: 41.9 % (ref 36.0–46.0)
Hemoglobin: 13.2 g/dL (ref 12.0–15.0)
MCH: 28.9 pg (ref 26.0–34.0)
MCHC: 31.5 g/dL (ref 30.0–36.0)
MCV: 91.7 fL (ref 80.0–100.0)
Platelets: 183 10*3/uL (ref 150–400)
RBC: 4.57 MIL/uL (ref 3.87–5.11)
RDW: 13.5 % (ref 11.5–15.5)
WBC: 5.6 10*3/uL (ref 4.0–10.5)
nRBC: 0 % (ref 0.0–0.2)

## 2018-04-17 NOTE — Progress Notes (Signed)
PCP: Dr. Leonard Downing  CARDIOLOGIST:  INFO IN Epic: CBC, BMP, T&S  INFO ON CHART:  BLOOD THINNERS AND LAST DOSES: ____________________________________  PATIENT SYMPTOMS AT TIME OF PREOP:  Hx of COPD II

## 2018-04-19 NOTE — H&P (Signed)
Office Visit Report     04/11/2018   --------------------------------------------------------------------------------   Rachael Peters  MRN: [id number]  PRIMARY CARE:  Rachael Gottron. Arelia Sneddon, MD  DOB: 21-Aug-1960, 58 year old Female  REFERRING:  Rachael Columbia, MD  SSN: -[id number]  PROVIDER:  Louis Peters, M.D.    TREATING:  Rachael Peters, [place]    LOCATION:  [facility] [phone]   --------------------------------------------------------------------------------   CC/HPI: Pt presents today for pre-operative history and physical exam in anticipation of right robotic assisted lap partial nephrectomy by Dr. Alinda Peters on 04/20/18.  She had to reschedule her surgery from Jan due to the flu. She feels well and is without complaint. Pt denies F/C, HA, CP, SOB, N/V, diarrhea/constipation, back pain, flank pain, hematuria, and dysuria.   HX:   CC: Right renal neoplasm  Physician requesting consult: Dr. Burman Peters  Primary care physician: Dr. Claris Peters   Rachael Peters is a 58 year old female who was incidentally noted to have a 9 mm indeterminate right posterior lower pole renal lesion on CT imaging in August 2018. She was evaluated by Dr. Louis Peters and initially elected surveillance of this small mass. Her mass was noted to be stable in January 2019. She underwent further surveillance imaging with a CT scan on 11/24/17 indicating that this lesion now measured 10 x 7 mm and was clearly enhancing. She presents today to consider a minimally invasive nephron sparing surgery for treatment.   Family history of kidney cancer: Grandmother  Family history of ESRD: None   Imaging: CT scan (11/24/17)  Side of renal neoplasm: Right  Size of renal neoplasm: 1.0 cm  Location of renal neoplasm: Lower pole  Exophytic or endophytic: Exophytic  Renal nephrometry score: 4p   Renal artery anatomy: Single renal artery with early branching of upper pole segmental vessel  Renal vein anatomy:  Single renal vein   Contralateral renal lesions:  Regional lymphadenopathy: None.  Adrenal masses: None.  Renal vein/IVC involvement: No.  Metastatic disease to the abdomen: No.   Chest imaging: PENDING  LFTs: Normal.   Baseline renal function: Cr , eGFR ml/min   PMH: Past medical history is significant for depression, GERD, and asthma.  PSH: No abdominal surgeries.     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   Tobacco Use Assessment Completed: Used Tobacco in last 30 days? Does not use smokeless tobacco. Social Drinker.  Does not use drugs. Drinks 1 caffeinated drink per day. Has not had a blood transfusion.     Notes: Activities of daily living (ADL's), independent, Exercise habits, Never a smoker,  Married, Alcohol use, Occupation full time care giver for parents   REVIEW OF SYSTEMS:    GU Review Female:   Patient denies frequent urination, hard to postpone urination, burning /pain with urination, get up at night to urinate, leakage of urine, stream starts and stops, trouble starting your stream, have to strain to  urinate, and being pregnant.  Gastrointestinal (Upper):   Patient denies nausea, vomiting, and indigestion/ heartburn.  Gastrointestinal (Lower):   Patient denies diarrhea and constipation.  Constitutional:   Patient denies fever, night sweats, weight loss, and fatigue.  Skin:   Patient denies skin rash/ lesion and itching.  Eyes:   Patient denies blurred vision and double vision.  Ears/ Nose/ Throat:   Patient denies sore throat and sinus problems.  Hematologic/Lymphatic:   Patient denies swollen glands and easy bruising.  Cardiovascular:   Patient denies leg swelling and chest pains.  Respiratory:   Patient denies cough and shortness of breath.  Endocrine:   Patient denies excessive thirst.  Musculoskeletal:   Patient denies back pain and joint pain.  Neurological:   Patient denies headaches and dizziness.  Psychologic:   Patient denies depression and anxiety.   VITAL SIGNS:      04/11/2018 01:50 PM  Weight 155 lb / 70.31 kg  Height 70 in / 177.8 cm  BP 115/74 mmHg  Pulse 60 /min  BMI 22.2 kg/m   MULTI-SYSTEM PHYSICAL EXAMINATION:    Constitutional: Well-nourished. No physical deformities. Normally developed. Good grooming.  Neck: Neck symmetrical, not swollen. Normal tracheal position.  Respiratory: Normal breath sounds. No labored breathing, no use of accessory muscles.   Cardiovascular: Regular rate and rhythm. No murmur, no gallop. Normal temperature.   Lymphatic: No enlargement of neck, axillae, groin.  Skin: No paleness, no jaundice, no cyanosis. No lesion, no ulcer, no rash.  Neurologic / Psychiatric: Oriented to time, oriented to place, oriented to person. No depression, no anxiety, no agitation.  Gastrointestinal: No mass, no tenderness, no rigidity, non obese abdomen.  Eyes: Normal conjunctivae. Normal eyelids.  Ears, Nose, Mouth, and Throat: Left ear no scars, no lesions, no masses. Right ear no scars, no lesions, no masses. Nose no scars, no lesions, no masses. Normal  hearing. Normal lips.  Musculoskeletal: Normal gait and station of head and neck.     PAST DATA REVIEWED:  Source Of History:  Patient  Records Review:   Previous Patient Records  Urine Test Review:   Urinalysis   04/11/18  Urinalysis  Urine Appearance Clear   Urine Color Yellow   Urine Glucose Neg mg/dL  Urine Bilirubin Neg mg/dL  Urine Ketones Neg mg/dL  Urine Specific Gravity 1.010   Urine Blood Neg ery/uL  Urine pH 5.5   Urine Protein Neg mg/dL  Urine Urobilinogen 0.2 mg/dL  Urine Nitrites Neg   Urine Leukocyte Esterase Neg leu/uL   PROCEDURES:          Urinalysis - 81003 Dipstick Dipstick Cont'd  Color: Yellow Bilirubin: Neg mg/dL  Appearance: Clear Ketones: Neg mg/dL  Specific Gravity: 1.010 Blood: Neg ery/uL  pH: 5.5 Protein: Neg mg/dL  Glucose: Neg mg/dL Urobilinogen: 0.2 mg/dL    Nitrites: Neg    Leukocyte Esterase: Neg leu/uL    ASSESSMENT:      ICD-10 Details  1 GU:   Right renal neoplasm - D49.511    PLAN:           Schedule Return Visit/Planned Activity: Keep Scheduled Appointment - Schedule Surgery  Document Letter(s):  Created for Patient: Clinical Summary         Notes:   There are no changes in the patients history or physical exam since last evaluation by Dr. Alinda Peters. Pt is scheduled to undergo right robotic assisted lap partial nephrectomy on 04/20/18.    All pt's questions were answered to the best of my ability.          Next Appointment:      Next Appointment: 04/20/2018 07:15 AM    Appointment Type: Surgery     Location: [facility] [phone]    Provider: Raynelle Bring, M.D.    Reason for Visit: WL/EXT REC RT RA LAP PARTIAL NEPHRECTOMY WITH AMANDA      * Signed by Rachael Rossetti, PA on 04/11/18 at 4:26 PM (EST)*

## 2018-04-19 NOTE — Anesthesia Preprocedure Evaluation (Addendum)
Anesthesia Evaluation  Patient identified by MRN, date of birth, ID band Patient awake    Reviewed: Allergy & Precautions, NPO status , Patient's Chart, lab work & pertinent test results  History of Anesthesia Complications (+) PONV  Airway Mallampati: II  TM Distance: >3 FB Neck ROM: Full    Dental  (+) Dental Advisory Given, Teeth Intact   Pulmonary asthma , COPD, Current Smoker,    Pulmonary exam normal breath sounds clear to auscultation       Cardiovascular negative cardio ROS Normal cardiovascular exam Rhythm:Regular Rate:Normal     Neuro/Psych PSYCHIATRIC DISORDERS Anxiety Depression negative neurological ROS     GI/Hepatic negative GI ROS, Neg liver ROS,   Endo/Other  negative endocrine ROS  Renal/GU Renal disease     Musculoskeletal  (+) Arthritis ,   Abdominal   Peds  Hematology negative hematology ROS (+)   Anesthesia Other Findings Day of surgery medications reviewed with the patient.  Reproductive/Obstetrics                            Anesthesia Physical Anesthesia Plan  ASA: II  Anesthesia Plan: General   Post-op Pain Management:    Induction: Intravenous  PONV Risk Score and Plan: 4 or greater and Ondansetron, Dexamethasone, Propofol infusion, Metaclopromide, Treatment may vary due to age or medical condition and Midazolam  Airway Management Planned: Oral ETT  Additional Equipment: None  Intra-op Plan:   Post-operative Plan: Extubation in OR  Informed Consent: I have reviewed the patients History and Physical, chart, labs and discussed the procedure including the risks, benefits and alternatives for the proposed anesthesia with the patient or authorized representative who has indicated his/her understanding and acceptance.     Dental advisory given  Plan Discussed with: CRNA  Anesthesia Plan Comments:         Anesthesia Quick Evaluation

## 2018-04-20 ENCOUNTER — Ambulatory Visit (HOSPITAL_COMMUNITY): Payer: BLUE CROSS/BLUE SHIELD | Admitting: Physician Assistant

## 2018-04-20 ENCOUNTER — Other Ambulatory Visit: Payer: Self-pay

## 2018-04-20 ENCOUNTER — Encounter (HOSPITAL_COMMUNITY)
Admission: RE | Disposition: A | Discharge status: Home / Self Care | Payer: Self-pay | Source: Other Acute Inpatient Hospital | Attending: Urology

## 2018-04-20 ENCOUNTER — Ambulatory Visit (HOSPITAL_COMMUNITY): Payer: BLUE CROSS/BLUE SHIELD | Admitting: Certified Registered Nurse Anesthetist

## 2018-04-20 ENCOUNTER — Encounter (HOSPITAL_COMMUNITY): Payer: Self-pay | Admitting: Anesthesiology

## 2018-04-20 ENCOUNTER — Observation Stay (HOSPITAL_COMMUNITY)
Admission: RE | Admit: 2018-04-20 | Discharge: 2018-04-21 | Disposition: A | Discharge status: Home / Self Care | Payer: BLUE CROSS/BLUE SHIELD | Source: Other Acute Inpatient Hospital | Attending: Urology | Admitting: Urology

## 2018-04-20 DIAGNOSIS — F419 Anxiety disorder, unspecified: Secondary | ICD-10-CM | POA: Diagnosis not present

## 2018-04-20 DIAGNOSIS — Z7951 Long term (current) use of inhaled steroids: Secondary | ICD-10-CM | POA: Insufficient documentation

## 2018-04-20 DIAGNOSIS — Z8051 Family history of malignant neoplasm of kidney: Secondary | ICD-10-CM | POA: Diagnosis not present

## 2018-04-20 DIAGNOSIS — D1771 Benign lipomatous neoplasm of kidney: Principal | ICD-10-CM | POA: Insufficient documentation

## 2018-04-20 DIAGNOSIS — D49511 Neoplasm of unspecified behavior of right kidney: Secondary | ICD-10-CM | POA: Diagnosis present

## 2018-04-20 DIAGNOSIS — F17200 Nicotine dependence, unspecified, uncomplicated: Secondary | ICD-10-CM | POA: Diagnosis not present

## 2018-04-20 DIAGNOSIS — J449 Chronic obstructive pulmonary disease, unspecified: Secondary | ICD-10-CM | POA: Insufficient documentation

## 2018-04-20 DIAGNOSIS — F329 Major depressive disorder, single episode, unspecified: Secondary | ICD-10-CM | POA: Diagnosis not present

## 2018-04-20 HISTORY — PX: ROBOTIC ASSITED PARTIAL NEPHRECTOMY: SHX6087

## 2018-04-20 LAB — BASIC METABOLIC PANEL
Anion gap: 6 (ref 5–15)
BUN: 11 mg/dL (ref 6–20)
CO2: 27 mmol/L (ref 22–32)
Calcium: 8.4 mg/dL — ABNORMAL LOW (ref 8.9–10.3)
Chloride: 105 mmol/L (ref 98–111)
Creatinine, Ser: 0.86 mg/dL (ref 0.44–1.00)
GFR calc Af Amer: >60 mL/min (ref >60)
GLUCOSE: 156 mg/dL — AB (ref 70–99)
Potassium: 3.5 mmol/L (ref 3.5–5.1)
Sodium: 138 mmol/L (ref 135–145)

## 2018-04-20 LAB — HEMOGLOBIN AND HEMATOCRIT, BLOOD
HCT: 40 % (ref 36.0–46.0)
Hemoglobin: 12.4 g/dL (ref 12.0–15.0)

## 2018-04-20 LAB — TYPE AND SCREEN
ABO/RH(D): O POS
Antibody Screen: NEGATIVE

## 2018-04-20 SURGERY — NEPHRECTOMY, PARTIAL, ROBOT-ASSISTED
Anesthesia: General | Laterality: Right

## 2018-04-20 MED ORDER — FENTANYL CITRATE (PF) 100 MCG/2ML IJ SOLN
INTRAMUSCULAR | Status: AC
  Filled 2018-04-20: qty 2

## 2018-04-20 MED ORDER — MONTELUKAST SODIUM 10 MG PO TABS: 10.0000 mg | ORAL_TABLET | Freq: Every day | ORAL | Status: DC | PRN

## 2018-04-20 MED ORDER — DEXAMETHASONE SODIUM PHOSPHATE 10 MG/ML IJ SOLN
INTRAMUSCULAR | Status: AC
  Filled 2018-04-20: qty 1

## 2018-04-20 MED ORDER — DEXAMETHASONE SODIUM PHOSPHATE 10 MG/ML IJ SOLN
INTRAMUSCULAR | Status: DC | PRN
  Administered 2018-04-20: 10 mg via INTRAVENOUS

## 2018-04-20 MED ORDER — SCOPOLAMINE 1 MG/3DAYS TD PT72
1.0000 | MEDICATED_PATCH | TRANSDERMAL | Status: DC
  Administered 2018-04-20: 1.5 mg via TRANSDERMAL
  Filled 2018-04-20: qty 1

## 2018-04-20 MED ORDER — MIDAZOLAM HCL 5 MG/5ML IJ SOLN
INTRAMUSCULAR | Status: DC | PRN
  Administered 2018-04-20: 2 mg via INTRAVENOUS

## 2018-04-20 MED ORDER — EPHEDRINE SULFATE-NACL 50-0.9 MG/10ML-% IV SOSY
PREFILLED_SYRINGE | INTRAVENOUS | Status: DC | PRN
  Administered 2018-04-20: 5 mg via INTRAVENOUS
  Administered 2018-04-20: 10 mg via INTRAVENOUS

## 2018-04-20 MED ORDER — ONDANSETRON HCL 4 MG/2ML IJ SOLN
INTRAMUSCULAR | Status: DC | PRN
  Administered 2018-04-20 (×2): 4 mg via INTRAVENOUS

## 2018-04-20 MED ORDER — PROMETHAZINE HCL 25 MG/ML IJ SOLN: 6.2500 mg | INTRAMUSCULAR | Status: DC | PRN

## 2018-04-20 MED ORDER — PROPOFOL 500 MG/50ML IV EMUL
INTRAVENOUS | Status: DC | PRN
  Administered 2018-04-20: 20 ug/kg/min via INTRAVENOUS

## 2018-04-20 MED ORDER — GLYCOPYRROLATE PF 0.2 MG/ML IJ SOSY
PREFILLED_SYRINGE | INTRAMUSCULAR | Status: DC | PRN
  Administered 2018-04-20: .2 mg via INTRAVENOUS

## 2018-04-20 MED ORDER — ROCURONIUM BROMIDE 100 MG/10ML IV SOLN
INTRAVENOUS | Status: AC
  Filled 2018-04-20: qty 1

## 2018-04-20 MED ORDER — ESCITALOPRAM OXALATE 10 MG PO TABS
10.0000 mg | ORAL_TABLET | Freq: Every evening | ORAL | Status: DC
  Administered 2018-04-20 – 2018-04-21 (×2): 10 mg via ORAL
  Filled 2018-04-20 (×2): qty 1

## 2018-04-20 MED ORDER — TRAMADOL HCL 50 MG PO TABS: 50.0000 mg | ORAL_TABLET | Freq: Four times a day (QID) | ORAL | 0 refills | Status: DC | PRN

## 2018-04-20 MED ORDER — SODIUM CHLORIDE (PF) 0.9 % IJ SOLN
INTRAMUSCULAR | Status: DC | PRN
  Administered 2018-04-20: 20 mL

## 2018-04-20 MED ORDER — LACTATED RINGERS IR SOLN
Status: DC | PRN
  Administered 2018-04-20: 1000 mL

## 2018-04-20 MED ORDER — METOCLOPRAMIDE HCL 5 MG/ML IJ SOLN
INTRAMUSCULAR | Status: AC
  Filled 2018-04-20: qty 2

## 2018-04-20 MED ORDER — METOCLOPRAMIDE HCL 5 MG/ML IJ SOLN
INTRAMUSCULAR | Status: DC | PRN
  Administered 2018-04-20: 10 mg via INTRAVENOUS

## 2018-04-20 MED ORDER — DOCUSATE SODIUM 100 MG PO CAPS
100.0000 mg | ORAL_CAPSULE | Freq: Two times a day (BID) | ORAL | Status: DC
  Administered 2018-04-20 – 2018-04-21 (×2): 100 mg via ORAL
  Filled 2018-04-20 (×2): qty 1

## 2018-04-20 MED ORDER — ROCURONIUM BROMIDE 10 MG/ML (PF) SYRINGE
PREFILLED_SYRINGE | INTRAVENOUS | Status: DC | PRN
  Administered 2018-04-20: 20 mg via INTRAVENOUS
  Administered 2018-04-20: 60 mg via INTRAVENOUS

## 2018-04-20 MED ORDER — ACETAMINOPHEN 10 MG/ML IV SOLN
1000.0000 mg | Freq: Four times a day (QID) | INTRAVENOUS | Status: AC
  Administered 2018-04-20 – 2018-04-21 (×4): 1000 mg via INTRAVENOUS
  Filled 2018-04-20 (×4): qty 100

## 2018-04-20 MED ORDER — SUGAMMADEX SODIUM 200 MG/2ML IV SOLN
INTRAVENOUS | Status: AC
  Filled 2018-04-20: qty 6

## 2018-04-20 MED ORDER — LACTATED RINGERS IV SOLN
INTRAVENOUS | Status: DC
  Administered 2018-04-20: 06:00:00 via INTRAVENOUS

## 2018-04-20 MED ORDER — ALBUTEROL SULFATE (2.5 MG/3ML) 0.083% IN NEBU: 3.0000 mL | INHALATION_SOLUTION | Freq: Four times a day (QID) | RESPIRATORY_TRACT | Status: DC | PRN

## 2018-04-20 MED ORDER — LACTATED RINGERS IV SOLN
INTRAVENOUS | Status: DC
  Administered 2018-04-20: 07:00:00 via INTRAVENOUS

## 2018-04-20 MED ORDER — SODIUM CHLORIDE (PF) 0.9 % IJ SOLN
INTRAMUSCULAR | Status: AC
  Filled 2018-04-20: qty 200

## 2018-04-20 MED ORDER — MIDAZOLAM HCL 2 MG/2ML IJ SOLN
INTRAMUSCULAR | Status: AC
  Filled 2018-04-20: qty 2

## 2018-04-20 MED ORDER — DEXTROSE-NACL 5-0.45 % IV SOLN
INTRAVENOUS | Status: DC
  Administered 2018-04-20 – 2018-04-21 (×2): via INTRAVENOUS

## 2018-04-20 MED ORDER — FENTANYL CITRATE (PF) 100 MCG/2ML IJ SOLN
INTRAMUSCULAR | Status: DC | PRN
  Administered 2018-04-20: 100 ug via INTRAVENOUS
  Administered 2018-04-20 (×2): 50 ug via INTRAVENOUS

## 2018-04-20 MED ORDER — MOMETASONE FURO-FORMOTEROL FUM 100-5 MCG/ACT IN AERO
2.0000 | INHALATION_SPRAY | Freq: Two times a day (BID) | RESPIRATORY_TRACT | Status: DC
  Filled 2018-04-20: qty 8.8

## 2018-04-20 MED ORDER — CEFAZOLIN SODIUM-DEXTROSE 2-4 GM/100ML-% IV SOLN
2.0000 g | Freq: Once | INTRAVENOUS | Status: AC
  Administered 2018-04-20: 2 g via INTRAVENOUS
  Filled 2018-04-20: qty 100

## 2018-04-20 MED ORDER — PROPOFOL 10 MG/ML IV BOLUS
INTRAVENOUS | Status: AC
  Filled 2018-04-20: qty 60

## 2018-04-20 MED ORDER — MORPHINE SULFATE (PF) 2 MG/ML IV SOLN
2.0000 mg | INTRAVENOUS | Status: DC | PRN
  Administered 2018-04-20 (×2): 2 mg via INTRAVENOUS
  Filled 2018-04-20 (×2): qty 1

## 2018-04-20 MED ORDER — PROPOFOL 10 MG/ML IV BOLUS
INTRAVENOUS | Status: DC | PRN
  Administered 2018-04-20: 200 mg via INTRAVENOUS

## 2018-04-20 MED ORDER — ONDANSETRON HCL 4 MG/2ML IJ SOLN
INTRAMUSCULAR | Status: AC
  Filled 2018-04-20: qty 2

## 2018-04-20 MED ORDER — EPHEDRINE 5 MG/ML INJ
INTRAVENOUS | Status: AC
  Filled 2018-04-20: qty 10

## 2018-04-20 MED ORDER — BUPIVACAINE LIPOSOME 1.3 % IJ SUSP
20.0000 mL | Freq: Once | INTRAMUSCULAR | Status: AC
  Administered 2018-04-20: 20 mL
  Filled 2018-04-20: qty 20

## 2018-04-20 MED ORDER — LIDOCAINE 2% (20 MG/ML) 5 ML SYRINGE
INTRAMUSCULAR | Status: DC | PRN
  Administered 2018-04-20: 80 mg via INTRAVENOUS

## 2018-04-20 MED ORDER — MEPERIDINE HCL 50 MG/ML IJ SOLN: 6.2500 mg | INTRAMUSCULAR | Status: DC | PRN

## 2018-04-20 MED ORDER — LIDOCAINE 2% (20 MG/ML) 5 ML SYRINGE
INTRAMUSCULAR | Status: AC
  Filled 2018-04-20: qty 5

## 2018-04-20 MED ORDER — CEFAZOLIN SODIUM-DEXTROSE 1-4 GM/50ML-% IV SOLN
1.0000 g | Freq: Three times a day (TID) | INTRAVENOUS | Status: AC
  Administered 2018-04-20 (×2): 1 g via INTRAVENOUS
  Filled 2018-04-20 (×2): qty 50

## 2018-04-20 MED ORDER — ONDANSETRON HCL 4 MG/2ML IJ SOLN
4.0000 mg | INTRAMUSCULAR | Status: DC | PRN
  Administered 2018-04-21: 4 mg via INTRAVENOUS
  Filled 2018-04-20: qty 2

## 2018-04-20 MED ORDER — SUGAMMADEX SODIUM 200 MG/2ML IV SOLN
INTRAVENOUS | Status: DC | PRN
  Administered 2018-04-20: 200 mg via INTRAVENOUS

## 2018-04-20 MED ORDER — GLYCOPYRROLATE PF 0.2 MG/ML IJ SOSY
PREFILLED_SYRINGE | INTRAMUSCULAR | Status: AC
  Filled 2018-04-20: qty 1

## 2018-04-20 MED ORDER — FENTANYL CITRATE (PF) 100 MCG/2ML IJ SOLN
25.0000 ug | INTRAMUSCULAR | Status: DC | PRN
  Administered 2018-04-20 (×2): 50 ug via INTRAVENOUS

## 2018-04-20 MED ORDER — DIPHENHYDRAMINE HCL 50 MG/ML IJ SOLN: 12.5000 mg | Freq: Four times a day (QID) | INTRAMUSCULAR | Status: DC | PRN

## 2018-04-20 MED ORDER — PROPOFOL 10 MG/ML IV BOLUS
INTRAVENOUS | Status: AC
  Filled 2018-04-20: qty 40

## 2018-04-20 MED ORDER — STERILE WATER FOR IRRIGATION IR SOLN
Status: DC | PRN
  Administered 2018-04-20: 1000 mL

## 2018-04-20 MED ORDER — DIPHENHYDRAMINE HCL 12.5 MG/5ML PO ELIX: 12.5000 mg | ORAL_SOLUTION | Freq: Four times a day (QID) | ORAL | Status: DC | PRN

## 2018-04-20 SURGICAL SUPPLY — 49 items
APPLICATOR SURGIFLO ENDO (HEMOSTASIS) ×2 IMPLANT
CHLORAPREP W/TINT 26ML (MISCELLANEOUS) ×2 IMPLANT
CLIP VESOLOCK LG 6/CT PURPLE (CLIP) ×2 IMPLANT
CLIP VESOLOCK MED LG 6/CT (CLIP) ×4 IMPLANT
COVER SURGICAL LIGHT HANDLE (MISCELLANEOUS) ×4 IMPLANT
COVER TIP SHEARS 8 DVNC (MISCELLANEOUS) ×1 IMPLANT
COVER TIP SHEARS 8MM DA VINCI (MISCELLANEOUS) ×1
COVER WAND RF STERILE (DRAPES) ×2 IMPLANT
DECANTER SPIKE VIAL GLASS SM (MISCELLANEOUS) ×2 IMPLANT
DERMABOND ADVANCED (GAUZE/BANDAGES/DRESSINGS) ×1
DERMABOND ADVANCED .7 DNX12 (GAUZE/BANDAGES/DRESSINGS) ×1 IMPLANT
DRAIN CHANNEL 15F RND FF 3/16 (WOUND CARE) ×2 IMPLANT
DRAPE ARM DVNC X/XI (DISPOSABLE) ×4 IMPLANT
DRAPE COLUMN DVNC XI (DISPOSABLE) ×1 IMPLANT
DRAPE DA VINCI XI ARM (DISPOSABLE) ×4
DRAPE DA VINCI XI COLUMN (DISPOSABLE) ×1
DRAPE INCISE IOBAN 66X45 STRL (DRAPES) ×2 IMPLANT
DRAPE SHEET LG 3/4 BI-LAMINATE (DRAPES) ×2 IMPLANT
ELECT PENCIL ROCKER SW 15FT (MISCELLANEOUS) ×2 IMPLANT
ELECT REM PT RETURN 15FT ADLT (MISCELLANEOUS) ×2 IMPLANT
EVACUATOR SILICONE 100CC (DRAIN) ×2 IMPLANT
GLOVE BIO SURGEON STRL SZ 6.5 (GLOVE) ×2 IMPLANT
GLOVE BIOGEL M STRL SZ7.5 (GLOVE) ×4 IMPLANT
GOWN STRL REUS W/TWL LRG LVL3 (GOWN DISPOSABLE) ×8 IMPLANT
HEMOSTAT SURGICEL 4X8 (HEMOSTASIS) IMPLANT
IRRIG SUCT STRYKERFLOW 2 WTIP (MISCELLANEOUS) ×2
IRRIGATION SUCT STRKRFLW 2 WTP (MISCELLANEOUS) ×1 IMPLANT
KIT BASIN OR (CUSTOM PROCEDURE TRAY) ×2 IMPLANT
POUCH SPECIMEN RETRIEVAL 10MM (ENDOMECHANICALS) ×2 IMPLANT
PROTECTOR NERVE ULNAR (MISCELLANEOUS) ×4 IMPLANT
SEAL CANN UNIV 5-8 DVNC XI (MISCELLANEOUS) ×4 IMPLANT
SEAL XI 5MM-8MM UNIVERSAL (MISCELLANEOUS) ×4
SOLUTION ELECTROLUBE (MISCELLANEOUS) ×2 IMPLANT
SURGIFLO W/THROMBIN 8M KIT (HEMOSTASIS) ×2 IMPLANT
SUT ETHILON 3 0 PS 1 (SUTURE) ×2 IMPLANT
SUT MNCRL AB 4-0 PS2 18 (SUTURE) ×4 IMPLANT
SUT V-LOC BARB 180 2/0GR6 GS22 (SUTURE) ×2
SUT VIC AB 0 CT1 27 (SUTURE) ×1
SUT VIC AB 0 CT1 27XBRD ANTBC (SUTURE) ×1 IMPLANT
SUT VICRYL 0 UR6 27IN ABS (SUTURE) ×4 IMPLANT
SUT VLOC BARB 180 ABS3/0GR12 (SUTURE) ×2
SUTURE V-LC BRB 180 2/0GR6GS22 (SUTURE) ×1 IMPLANT
SUTURE VLOC BRB 180 ABS3/0GR12 (SUTURE) ×1 IMPLANT
TOWEL OR 17X26 10 PK STRL BLUE (TOWEL DISPOSABLE) ×2 IMPLANT
TRAY FOLEY CATH 14FRSI W/METER (CATHETERS) ×2 IMPLANT
TRAY FOLEY MTR SLVR 14FR STAT (SET/KITS/TRAYS/PACK) ×2 IMPLANT
TRAY LAPAROSCOPIC (CUSTOM PROCEDURE TRAY) ×2 IMPLANT
TROCAR BLADELESS OPT 5 100 (ENDOMECHANICALS) ×2 IMPLANT
TROCAR XCEL 12X100 BLDLESS (ENDOMECHANICALS) ×2 IMPLANT

## 2018-04-20 NOTE — Anesthesia Postprocedure Evaluation (Signed)
Anesthesia Post Note  Patient: Rachael Peters  Procedure(s) Performed: XI ROBOTIC ASSITED LAPAROSCOPIC PARTIAL NEPHRECTOMY (Right )     Patient location during evaluation: PACU Anesthesia Type: General Level of consciousness: sedated and patient cooperative Pain management: pain level controlled Vital Signs Assessment: post-procedure vital signs reviewed and stable Respiratory status: spontaneous breathing Cardiovascular status: stable Anesthetic complications: no    Last Vitals:  Vitals:   04/20/18 1822 04/20/18 2007  BP: 101/65 103/63  Pulse:  63  Resp: 18 16  Temp: 36.6 C 36.7 C  SpO2: 96% 98%    Last Pain:  Vitals:   04/20/18 2007  TempSrc: Oral  PainSc:                  [name]

## 2018-04-20 NOTE — Interval H&P Note (Signed)
History and Physical Interval Note:  04/20/2018 6:46 AM  Rachael Peters  has presented today for surgery, with the diagnosis of RIGHT RENAL NEOPLASM  The various methods of treatment have been discussed with the patient and family. After consideration of risks, benefits and other options for treatment, the patient has consented to  Procedure(s): XI ROBOTIC ASSITED LAPAROSCOPIC PARTIAL NEPHRECTOMY (Right) as a surgical intervention .  The patient's history has been reviewed, patient examined, no change in status, stable for surgery.  I have reviewed the patient's chart and labs.  Questions were answered to the patient's satisfaction.     [employer]

## 2018-04-20 NOTE — Transfer of Care (Signed)
Immediate Anesthesia Transfer of Care Note  Patient: Rachael Peters  Procedure(s) Performed: XI ROBOTIC ASSITED LAPAROSCOPIC PARTIAL NEPHRECTOMY (Right )  Patient Location: PACU  Anesthesia Type:General  Level of Consciousness: awake, oriented, drowsy and patient cooperative  Airway & Oxygen Therapy: Patient Spontanous Breathing and Patient connected to face mask oxygen  Post-op Assessment: Report given to RN and Post -op Vital signs reviewed and stable  Post vital signs: Reviewed and stable  Last Vitals:  Vitals Value Taken Time  BP 136/80 04/20/2018  9:32 AM  Temp    Pulse 63 04/20/2018  9:35 AM  Resp 12 04/20/2018  9:35 AM  SpO2 100 % 04/20/2018  9:35 AM  Vitals shown include unvalidated device data.  Last Pain:  Vitals:   04/20/18 0554  TempSrc: Oral         Complications: No apparent anesthesia complications

## 2018-04-20 NOTE — Progress Notes (Signed)
Patient ID: BABYGIRL TRAGER, female   DOB: [birth year], 58 y.o.   MRN: [id number]  Post-op note  Subjective: The patient is doing well.  No complaints.  Objective: Vital signs in last 24 hours: Temp:  [97.2 F (36.2 C)-98.4 F (36.9 C)] 97.2 F (36.2 C) (03/05 1101) Pulse Rate:  [55-72] 72 (03/05 1405) Resp:  [10-18] 18 (03/05 1405) BP: (96-136)/(55-80) 96/55 (03/05 1405) SpO2:  [95 %-100 %] 95 % (03/05 1405) Weight:  [73.9 kg] 73.9 kg (03/05 0614)  Intake/Output from previous day: No intake/output data recorded. Intake/Output this shift: Total I/O In: 1200 [I.V.:1100; IV Piggyback:100] Out: 132 [Urine:82; Blood:50]  Physical Exam:  General: Alert and oriented. Abdomen: Soft, Nondistended. Incisions: Clean and dry.  Lab Results: Recent Labs    04/20/18 0942  HGB 12.4  HCT 40.0    Assessment/Plan: POD#0   1) Continue to monitor, IS, will begin ambulating later tonight    Pryor Curia. MD   LOS: 0 days   Dutch Gray 04/20/2018, 3:25 PM

## 2018-04-20 NOTE — Anesthesia Procedure Notes (Signed)
Procedure Name: Intubation Date/Time: 04/20/2018 7:51 AM Performed by: Lavina Hamman, CRNA Pre-anesthesia Checklist: Patient identified, Emergency Drugs available, Suction available, Patient being monitored and Timeout performed Patient Re-evaluated:Patient Re-evaluated prior to induction Oxygen Delivery Method: Circle system utilized Preoxygenation: Pre-oxygenation with 100% oxygen Induction Type: IV induction Ventilation: Mask ventilation without difficulty Laryngoscope Size: Mac and 4 Grade View: Grade II Tube type: Oral Tube size: 7.5 mm Number of attempts: 1 Airway Equipment and Method: Stylet Placement Confirmation: ETT inserted through vocal cords under direct vision,  positive ETCO2,  CO2 detector and breath sounds checked- equal and bilateral Secured at: 22 cm Tube secured with: Tape Dental Injury: Teeth and Oropharynx as per pre-operative assessment  Comments: ATOI

## 2018-04-20 NOTE — Discharge Instructions (Signed)

## 2018-04-20 NOTE — Op Note (Signed)
Preoperative diagnosis: Right renal neoplasm  Postoperative diagnosis: Right renal neoplasm  Procedure:  1. Right robotic-assisted laparoscopic partial nephrectomy  Surgeon: Pryor Curia. M.D.  Assistant(s): Debbrah Alar, PA-C  An assistant was required for this surgical procedure.  The duties of the assistant included but were not limited to suctioning, passing suture, camera manipulation, retraction. This procedure would not be able to be performed without an Environmental consultant.  Anesthesia: General  Complications: None  EBL: 50 mL  IVF:  950 mL crystalloid  Specimens: 1. Right renal neoplasm  Disposition of specimens: Pathology  Intraoperative findings:       1. Warm renal ischemia time: 10 minutes  Drains: 1. # 15 Blake perinephric drain  Indication:  Rachael Peters is a 58 y.o. year old patient with a right renal mass.  After a thorough review of the management options for their renal mass, they elected to proceed with surgical treatment and the above procedure.  We have discussed the potential benefits and risks of the procedure, side effects of the proposed treatment, the likelihood of the patient achieving the goals of the procedure, and any potential problems that might occur during the procedure or recuperation. Informed consent has been obtained.   Description of procedure:  The patient was taken to the operating room and a general anesthetic was administered. The patient was given preoperative antibiotics, placed in the right modified flank position with care to pad all potential pressure points, and prepped and draped in the usual sterile fashion. Next a preoperative timeout was performed.  A site was selected on in the midline for placement of the assistant port. This was placed using a standard open Hassan technique which allowed entry into the peritoneal cavity under direct vision and without difficulty. A 12 mm port was placed and a pneumoperitoneum  established. The camera was then used to inspect the abdomen and there was no evidence of any intra-abdominal injuries or other abnormalities. The remaining abdominal ports were then placed. 8 mm robotic ports were placed in the right upper quadrant, right lower quadrant, and far right lateral abdominal wall. An 8 mm port was placed for the camera site just right of the umbilicus. All ports were placed under direct vision without difficulty. The surgical cart was then docked.   Utilizing the cautery scissors, the white line of Toldt was incised allowing the colon to be mobilized medially and the plane between the mesocolon and the anterior layer of Gerota's fascia to be developed and the kidney to be exposed.  The ureter and gonadal vein were identified inferiorly and the ureter was lifted anteriorly off the psoas muscle.  Dissection proceeded superiorly along the gonadal vein until the renal vein was identified.  The renal hilum was then carefully isolated with a combination of blunt and sharp dissection allowing the renal arterial and venous structures to be separated and isolated in preparation for renal hilar vessel clamping. There was a single branching renal artery with a lower pole renal artery branch.    Attention turned to the kidney and the perinephric fat surrounding the renal mass was removed and the kidney was mobilized sufficiently for exposure and resection of the renal mass.   Once the renal mass was properly isolated, preparations were made for resection of the tumor.  Reconstructive sutures were placed into the abdomen for the renorrhaphy portion of the procedure.  The lower pole branch artery was then clamped with bulldog clamps.  The tumor was then excised with cold  scissor dissection along with an adequate visible gross margin of normal renal parenchyma. The tumor appeared to be excised without any gross violation of the tumor. The renal collecting system was not entered during removal of  the tumor.  A running 3-0 V-lock suture was then brought through the capsule of the kidney and run along the base of the renal defect to provide hemostasis and close any entry into the renal collecting system if present. Weck clips were used to secure this suture outside the renal capsule at the proximal and distal ends. An additional hemostatic agent (Surgiflo) was then placed into the renal defect. A running 2-0 V lock suture was then used to close the capsule of the kidney using a sliding clip technique which resulted in excellent hemostasis.    The bulldog clamps were then removed from the renal hilar vessel(s).. Total warm renal ischemia time was 10 minutes. The renal tumor resection site was examined. Hemostasis appeared adequate.   The kidney was placed back into its normal anatomic position and covered with perinephric fat as needed.  A # 66 Blake drain was then brought through the lateral lower port site and positioned in the perinephric space.  It was secured to the skin with a nylon suture. The surgical cart was undocked.  The renal tumor specimen was removed intact within an endopouch retrieval bag via the upper midline port site. This incision site was closed at the fascial layer with 0-vicryl suture. All other laparoscopic/robotic ports were removed under direct vision and the pneumoperitoneum let down with inspection of the operative field performed and hemostasis again confirmed. All incision sites were then injected with local anesthetic and reapproximated at the skin level with 4-0 monocryl subcuticular closures.  Dermabond was applied to the skin.  The patient tolerated the procedure well and without complications.  The patient was able to be extubated and transferred to the recovery unit in satisfactory condition.  Pryor Curia MD

## 2018-04-20 NOTE — Progress Notes (Signed)
Patient ID: Rachael Peters, female   DOB: 02/21/1960, 58 y.o.   MRN: [id number]  Post-op note  Subjective: The patient is doing well.  No complaints.  Minimal pain.  Denies N/V  Objective: Vital signs in last 24 hours: Temp:  [97.2 F (36.2 C)-98.4 F (36.9 C)] 97.2 F (36.2 C) (03/05 1101) Pulse Rate:  [55-72] 72 (03/05 1405) Resp:  [10-18] 18 (03/05 1405) BP: (96-136)/(55-80) 96/55 (03/05 1405) SpO2:  [95 %-100 %] 95 % (03/05 1405) Weight:  [73.9 kg] 73.9 kg (03/05 0614)  Intake/Output from previous day: No intake/output data recorded. Intake/Output this shift: Total I/O In: 1200 [I.V.:1100; IV Piggyback:100] Out: 132 [Urine:82; Blood:50]  Physical Exam:  General: Alert and oriented. Abdomen: Soft, Nondistended. Incisions: Clean and dry. Urine: clear  Lab Results: Recent Labs    04/20/18 0942  HGB 12.4  HCT 40.0    Assessment/Plan: POD#0   1) Continue to monitor  2) DVT prophy, clears, IS, pain control  3) Bed rest tonight   LOS: 0 days   Rachael Peters 04/20/2018, 2:42 PM

## 2018-04-21 ENCOUNTER — Encounter (HOSPITAL_COMMUNITY): Payer: Self-pay | Admitting: Urology

## 2018-04-21 DIAGNOSIS — D1771 Benign lipomatous neoplasm of kidney: Secondary | ICD-10-CM | POA: Diagnosis not present

## 2018-04-21 LAB — HEMOGLOBIN AND HEMATOCRIT, BLOOD
HCT: 35.7 % — ABNORMAL LOW (ref 36.0–46.0)
Hemoglobin: 11.1 g/dL — ABNORMAL LOW (ref 12.0–15.0)

## 2018-04-21 LAB — BASIC METABOLIC PANEL
ANION GAP: 6 (ref 5–15)
BUN: 7 mg/dL (ref 6–20)
CO2: 26 mmol/L (ref 22–32)
Calcium: 8.2 mg/dL — ABNORMAL LOW (ref 8.9–10.3)
Chloride: 105 mmol/L (ref 98–111)
Creatinine, Ser: 0.81 mg/dL (ref 0.44–1.00)
GFR calc Af Amer: >60 mL/min (ref >60)
GFR calc non Af Amer: >60 mL/min (ref >60)
Glucose, Bld: 144 mg/dL — ABNORMAL HIGH (ref 70–99)
Potassium: 3.9 mmol/L (ref 3.5–5.1)
Sodium: 137 mmol/L (ref 135–145)

## 2018-04-21 LAB — CREATININE, FLUID (PLEURAL, PERITONEAL, JP DRAINAGE): Creat, Fluid: 0.8 mg/dL

## 2018-04-21 MED ORDER — TRAMADOL HCL 50 MG PO TABS
50.0000 mg | ORAL_TABLET | Freq: Four times a day (QID) | ORAL | Status: DC | PRN
  Administered 2018-04-21: 50 mg via ORAL
  Filled 2018-04-21: qty 1

## 2018-04-21 MED ORDER — BISACODYL 10 MG RE SUPP
10.0000 mg | Freq: Once | RECTAL | Status: AC
  Administered 2018-04-21: 10 mg via RECTAL
  Filled 2018-04-21: qty 1

## 2018-04-21 NOTE — Progress Notes (Signed)
Patient ID: Rachael Peters, female   DOB: Mar 03, 1960, 58 y.o.   MRN: [id number]  1 Day Post-Op Subjective: Doing well overall.  Ambulated last night.  Emesis one time this morning.  Objective: Vital signs in last 24 hours: Temp:  [97.2 F (36.2 C)-98.4 F (36.9 C)] 98.2 F (36.8 C) (03/06 0551) Pulse Rate:  [55-72] 66 (03/06 0551) Resp:  [10-18] 16 (03/06 0551) BP: (96-136)/(55-80) 102/64 (03/06 0551) SpO2:  [94 %-100 %] 94 % (03/06 0551)  Intake/Output from previous day: 03/05 0701 - 03/06 0700 In: 2212.1 [I.V.:1894.5; IV Piggyback:317.6] Out: 2817 [Urine:2657; Drains:110; Blood:50] Intake/Output this shift: No intake/output data recorded.  Physical Exam:  General: Alert and oriented CV: RRR Lungs: Clear Abdomen: Soft, ND, Minimal BS Incisions: C/D/I Ext: NT, No erythema  Lab Results: Recent Labs    04/20/18 0942 04/21/18 0537  HGB 12.4 11.1*  HCT 40.0 35.7*   BMET Recent Labs    04/20/18 0942 04/21/18 0537  NA 138 137  K 3.5 3.9  CL 105 105  CO2 27 26  GLUCOSE 156* 144*  BUN 11 7  CREATININE 0.86 0.81  CALCIUM 8.4* 8.2*     Studies/Results: No results found.  Assessment/Plan: POD # 1 s/p right RAL partial nephrectomy - Ambulate, IS, DVT prophylaxis - Advance diet - D/C catheter - Check drain Cr - SL IVF - Oral pain medication - Possible D/C later today if doing well   LOS: 0 days   Dutch Gray 04/21/2018, 7:43 AM

## 2018-04-21 NOTE — Discharge Summary (Signed)
  Date of admission: 04/20/2018  Date of discharge: 04/21/2018  Admission diagnosis: Right renal neoplasm  Discharge diagnosis: Right renal neoplasm  Secondary diagnoses: Asthma, depression  History and Physical: For full details, please see admission history and physical. Briefly, Rachael Peters is a 58 y.o. year old patient with an incidentally detected right renal neoplasm.   Hospital Course: She underwent a right robotic partial nephrectomy on 04/20/18.  She remained hemodynamically stable and was able to begin ambulating without problems.  Her diet was advanced and her renal function remained stable. Her drain Cr was 0.8 and was removed.  She was discharged home on POD # 1.  Laboratory values:  Recent Labs    04/20/18 0942 04/21/18 0537  HGB 12.4 11.1*  HCT 40.0 35.7*   Recent Labs    04/20/18 0942 04/21/18 0537  CREATININE 0.86 0.81    Disposition: Home  Discharge instruction: The patient was instructed to be ambulatory but told to refrain from heavy lifting, strenuous activity, or driving.   Discharge medications:  Allergies as of 04/21/2018      Reactions   Codeine Nausea Only      Medication List    TAKE these medications   albuterol 108 (90 Base) MCG/ACT inhaler Commonly known as:  PROVENTIL HFA;VENTOLIN HFA Inhale 2 puffs into the lungs every 6 (six) hours as needed for wheezing or shortness of breath.   budesonide-formoterol 80-4.5 MCG/ACT inhaler Commonly known as:  Symbicort Inhale 2 puffs into the lungs 2 (two) times daily. What changed:    when to take this  reasons to take this   diphenhydrAMINE 25 MG tablet Commonly known as:  BENADRYL Take 25 mg by mouth daily as needed for allergies.   escitalopram 10 MG tablet Commonly known as:  LEXAPRO Take 10 mg by mouth every evening.   montelukast 10 MG tablet Commonly known as:  SINGULAIR Take 10 mg by mouth daily as needed (allergies).   traMADol 50 MG tablet Commonly known as:  Ultram Take  1-2 tablets (50-100 mg total) by mouth every 6 (six) hours as needed for moderate pain or severe pain.   VISINE OP Place 1 drop into both eyes daily as needed (allergies).       Followup:  Follow-up Information    Raynelle Bring, MD On 05/10/2018.   Specialty:  Urology Why:  at 12:45 Contact information: [place] [address] [phone]

## 2018-07-17 ENCOUNTER — Other Ambulatory Visit: Payer: Self-pay | Admitting: Obstetrics and Gynecology

## 2018-07-17 DIAGNOSIS — Z9289 Personal history of other medical treatment: Secondary | ICD-10-CM

## 2018-09-04 ENCOUNTER — Other Ambulatory Visit: Payer: Self-pay

## 2018-09-04 ENCOUNTER — Ambulatory Visit
Admission: RE | Admit: 2018-09-04 | Discharge: 2018-09-04 | Disposition: A | Discharge status: Home / Self Care | Payer: BLUE CROSS/BLUE SHIELD | Source: Ambulatory Visit | Attending: Obstetrics and Gynecology | Admitting: Obstetrics and Gynecology

## 2018-09-04 DIAGNOSIS — Z9289 Personal history of other medical treatment: Secondary | ICD-10-CM

## 2018-09-21 ENCOUNTER — Other Ambulatory Visit: Payer: Self-pay

## 2018-09-22 ENCOUNTER — Ambulatory Visit: Payer: BLUE CROSS/BLUE SHIELD | Admitting: Obstetrics and Gynecology

## 2018-09-22 NOTE — Progress Notes (Signed)
58 y.o. G39P0100 Married Caucasian female here for annual exam.    Patient complaining of hot flashes, weight gain and insomnia. Hair thinning and eyebrows thinning. [facility]  She did acupuncture prior to the pandemic. Quit tobacco and ETOH Jan. 2020.  Now taking Amberen.   Took Gabapentin for left hip pain.  She stopped due to concerns for side effects and did not find that it helped.  She still has an Rx for this.   She has been unable to do a breast MRI due to anxiety.   Caring for her parents full time.  She quit her job to take care of them.  They are ambulatory and do leave their home.   PCP: Claris Gower, MD    Patient's last menstrual period was 02/15/2013 (approximate).           Sexually active: Yes.    The current method of family planning is post menopausal status.    Exercising: No.  The patient does not participate in regular exercise at present. Was going to gym until 04/2018. Smoker:  Former  Health Maintenance: Pap: 06-21-16 Neg:Neg HR HPV, 06-16-15 Neg:Neg HR HPV History of abnormal Pap:  Yes, Hx conization of cervix 1990. MMG:  09-04-18 Neg/3D/density c/BiRads1 Colonoscopy: 2014 normal with [facility], next due 2024    BMD: 2016  Result :Normal  TDaP:  PCP Gardasil:   N/A HIV: 01-30-17 NR Hep C:06-21-16 Neg Screening Labs:   ----   reports that she quit smoking about 7 months ago. Her smoking use included cigarettes. She started smoking about 20 months ago. She has a 5.00 pack-year smoking history. She has never used smokeless tobacco. She reports previous alcohol use. She reports that she does not use drugs.  Past Medical History:  Diagnosis Date  . Abnormal Pap smear of cervix 1990   hx cervical conization for abnormal pap--paps normal since  . Anxiety   . Arthritis    DENIES   . Asthma   . Cat allergies   . COPD (chronic obstructive pulmonary disease) ([place])   . PONV (postoperative nausea and vomiting)     Past Surgical  History:  Procedure Laterality Date  . APPENDECTOMY  1997  . CERVICAL CONIZATION W/BX  1990   AND CERVIX LESION DESTRUCTION    . ROBOTIC ASSITED PARTIAL NEPHRECTOMY Right 04/20/2018   Procedure: XI ROBOTIC ASSITED LAPAROSCOPIC PARTIAL NEPHRECTOMY;  Surgeon: Raynelle Bring, MD;  Location: [facility];  Service: Urology;  Laterality: Right;    Current Outpatient Medications  Medication Sig Dispense Refill  . albuterol (PROVENTIL HFA;VENTOLIN HFA) 108 (90 Base) MCG/ACT inhaler Inhale 2 puffs into the lungs every 6 (six) hours as needed for wheezing or shortness of breath.     . budesonide-formoterol (SYMBICORT) 80-4.5 MCG/ACT inhaler Inhale 2 puffs into the lungs 2 (two) times daily. (Patient taking differently: Inhale 2 puffs into the lungs 2 (two) times daily as needed (shortness of breath). ) 1 Inhaler 11  . diphenhydrAMINE (BENADRYL) 25 MG tablet Take 25 mg by mouth daily as needed for allergies.    Marland Kitchen escitalopram (LEXAPRO) 10 MG tablet Take 10 mg by mouth every evening.   12   No current facility-administered medications for this visit.     Family History  Problem Relation Age of Onset  . Heart disease Maternal Grandmother   . Heart disease Maternal Grandfather   . Diabetes Maternal Grandfather   . Heart disease Paternal Grandmother   .  Heart disease Paternal Grandfather   . Breast cancer Mother 57  . Breast cancer Sister 59  . Heart disease Father   . Stroke Father   . Heart attack Father     Review of Systems  Constitutional:       Weight gain Insomnia   All other systems reviewed and are negative.   Exam:   BP 128/80   Pulse 64   Temp (!) 97.4 F (36.3 C) (Temporal)   Resp 14   Ht 5' 9.5" (1.765 m)   Wt 166 lb 6.4 oz (75.5 kg)   LMP 02/15/2013 (Approximate)   BMI 24.22 kg/m     General appearance: alert, cooperative and appears stated age Head: normocephalic, without obvious abnormality, atraumatic Neck: no adenopathy, supple, symmetrical, trachea midline and  thyroid normal to inspection and palpation Lungs: clear to auscultation bilaterally Breasts: normal appearance, no masses or tenderness, No nipple retraction or dimpling, No nipple discharge or bleeding, No axillary adenopathy Heart: regular rate and rhythm Abdomen: soft, non-tender; no masses, no organomegaly Extremities: extremities normal, atraumatic, no cyanosis or edema Skin: skin color, texture, turgor normal. No rashes or lesions Lymph nodes: cervical, supraclavicular, and axillary nodes normal. Neurologic: grossly normal  Pelvic: External genitalia:  no lesions              No abnormal inguinal nodes palpated.              Urethra:  normal appearing urethra with no masses, tenderness or lesions              Bartholins and Skenes: normal                 Vagina: normal appearing vagina with normal color and discharge, no lesions              Cervix: no lesions              Pap taken: No. Bimanual Exam:  Uterus:  normal size, contour, position, consistency, mobility, non-tender              Adnexa: no mass, fullness, tenderness              Rectal exam: Yes.  .  Confirms.              Anus:  normal sphincter tone, no lesions  Chaperone was present for exam.  Assessment:   Well woman visit with normal exam. FH of breast cancer in mother and sister. Sister is BRCA negative.  High risk for breast cancer.  Unable to do breast MRI in past.  Remote hx of cervical conization.  FH CAD. Former smoker. Status post partial right nephrectomy for cystic angiomyolipoma.  Menopausal symptoms.  Weight gain.  Anxiety. On Lexapro.  Plan: Mammogram screening discussed. Self breast awareness reviewed. Pap and HR HPV as above. Guidelines for Calcium, Vitamin D, regular exercise program including cardiovascular and weight bearing exercise. Routine labs including TSH. We discussed Paxil and Effexor versus Gabapentin.  She will continue with Amberen for now.  She will check on her  father's history of colon polyps prior to scheduling a colonoscopy this year.  She will then check with her GI about this.  She will consider breast MRI with antianxiety medication.  Follow up annually and prn.   After visit summary provided.

## 2018-09-26 ENCOUNTER — Ambulatory Visit: Payer: BC Managed Care – PPO | Admitting: Obstetrics and Gynecology

## 2018-09-26 ENCOUNTER — Other Ambulatory Visit: Payer: Self-pay

## 2018-09-26 ENCOUNTER — Encounter: Payer: Self-pay | Admitting: Obstetrics and Gynecology

## 2018-09-26 VITALS — BP 128/80 | HR 64 | Temp 97.4°F | Resp 14 | Ht 69.5 in | Wt 166.4 lb

## 2018-09-26 DIAGNOSIS — N951 Menopausal and female climacteric states: Secondary | ICD-10-CM | POA: Diagnosis not present

## 2018-09-26 DIAGNOSIS — Z01419 Encounter for gynecological examination (general) (routine) without abnormal findings: Secondary | ICD-10-CM | POA: Diagnosis not present

## 2018-09-26 DIAGNOSIS — Z803 Family history of malignant neoplasm of breast: Secondary | ICD-10-CM

## 2018-09-26 NOTE — Patient Instructions (Signed)

## 2018-09-27 LAB — COMPREHENSIVE METABOLIC PANEL
ALT: 15 IU/L (ref 0–32)
AST: 19 IU/L (ref 0–40)
Albumin/Globulin Ratio: 1.7 (ref 1.2–2.2)
Albumin: 4.5 g/dL (ref 3.8–4.9)
Alkaline Phosphatase: 76 IU/L (ref 39–117)
BUN/Creatinine Ratio: 13 (ref 9–23)
BUN: 13 mg/dL (ref 6–24)
Bilirubin Total: 0.4 mg/dL (ref 0.0–1.2)
CO2: 27 mmol/L (ref 20–29)
Calcium: 9.1 mg/dL (ref 8.7–10.2)
Chloride: 97 mmol/L (ref 96–106)
Creatinine, Ser: 1 mg/dL (ref 0.57–1.00)
GFR calc Af Amer: 73 mL/min/{1.73_m2} (ref >59)
GFR calc non Af Amer: 63 mL/min/{1.73_m2} (ref >59)
Globulin, Total: 2.6 g/dL (ref 1.5–4.5)
Glucose: 89 mg/dL (ref 65–99)
Potassium: 4.1 mmol/L (ref 3.5–5.2)
Sodium: 138 mmol/L (ref 134–144)
Total Protein: 7.1 g/dL (ref 6.0–8.5)

## 2018-09-27 LAB — CBC
Hematocrit: 43.2 % (ref 34.0–46.6)
Hemoglobin: 13.6 g/dL (ref 11.1–15.9)
MCH: 28.8 pg (ref 26.6–33.0)
MCHC: 31.5 g/dL (ref 31.5–35.7)
MCV: 92 fL (ref 79–97)
Platelets: 177 10*3/uL (ref 150–450)
RBC: 4.72 x10E6/uL (ref 3.77–5.28)
RDW: 13.3 % (ref 11.7–15.4)
WBC: 5.4 10*3/uL (ref 3.4–10.8)

## 2018-09-27 LAB — LIPID PANEL
Chol/HDL Ratio: 2.5 ratio (ref 0.0–4.4)
Cholesterol, Total: 136 mg/dL (ref 100–199)
HDL: 55 mg/dL (ref >39)
LDL Calculated: 63 mg/dL (ref 0–99)
Triglycerides: 89 mg/dL (ref 0–149)
VLDL Cholesterol Cal: 18 mg/dL (ref 5–40)

## 2018-09-27 LAB — TSH: TSH: 1.01 u[IU]/mL (ref 0.450–4.500)

## 2018-12-26 ENCOUNTER — Other Ambulatory Visit: Payer: Self-pay | Admitting: Orthopedic Surgery

## 2018-12-26 DIAGNOSIS — M25552 Pain in left hip: Secondary | ICD-10-CM

## 2019-01-18 ENCOUNTER — Ambulatory Visit
Admission: RE | Admit: 2019-01-18 | Discharge: 2019-01-18 | Disposition: A | Discharge status: Home / Self Care | Payer: BC Managed Care – PPO | Source: Ambulatory Visit | Attending: Orthopedic Surgery | Admitting: Orthopedic Surgery

## 2019-01-18 DIAGNOSIS — M25552 Pain in left hip: Secondary | ICD-10-CM

## 2019-01-22 ENCOUNTER — Encounter: Payer: Self-pay | Admitting: Internal Medicine

## 2019-01-22 ENCOUNTER — Ambulatory Visit (INDEPENDENT_AMBULATORY_CARE_PROVIDER_SITE_OTHER): Payer: BC Managed Care – PPO

## 2019-01-22 ENCOUNTER — Ambulatory Visit: Payer: BC Managed Care – PPO | Admitting: Internal Medicine

## 2019-01-22 ENCOUNTER — Other Ambulatory Visit: Payer: Self-pay

## 2019-01-22 DIAGNOSIS — J449 Chronic obstructive pulmonary disease, unspecified: Secondary | ICD-10-CM

## 2019-01-22 MED ORDER — BUDESONIDE-FORMOTEROL FUMARATE 160-4.5 MCG/ACT IN AERO: INHALATION_SPRAY | RESPIRATORY_TRACT | 12 refills | Status: DC

## 2019-01-22 NOTE — Progress Notes (Signed)
LMTCB

## 2019-01-22 NOTE — Patient Instructions (Addendum)
Plan A = Automatic = Always=    symbicort 160 Take 2 puffs first thing in am and then another 2 puffs about 12 hours later.    Work on inhaler technique:  relax and gently blow all the way out then take a nice smooth deep breath back in, triggering the inhaler at same time you start breathing in.  Hold for up to 5 seconds if you can. Blow out thru nose. Rinse and gargle with water when done      Plan B = Backup (to supplement plan A, not to replace it) Only use your albuterol inhaler as a rescue medication to be used if you can't catch your breath by resting or doing a relaxed purse lip breathing pattern.  - The less you use it, the better it will work when you need it. - Ok to use the inhaler up to 2 puffs  every 4 hours if you must but call for appointment if use goes up over your usual need - Don't leave home without it !!  (think of it like the spare tire for your car)      Please remember to go to the  x-ray department  for your tests - we will call you with the results when they are available     If not all better in 2 weeks we need to see you, otherwise return in 6 months with PFTs

## 2019-01-22 NOTE — Progress Notes (Signed)
Subjective:     Patient ID: Rachael Peters, female   DOB: 1960/06/03,   MRN: [id number]  HPI  71 yowf  MM quit smoking  Dec 14th 2018 with variable sob requiring multiple ERV so referred to pulmonary clinic 02/24/2017 by EDP    02/24/2017 1st [facility] office visit/ [facility]   Chief Complaint  Patient presents with  . Pulmonary Consult    Self referral. Pt c/o dyspnea for the past several years. She states that she started having more trouble after developed influenza Dec 2018. She states she gets winded with exercise and occ after eating. She has an albuterol inhaler that she uses 2 x per wk on average, "sometimes not at all".   no trouble with breathing during childhood or 28 week IUP with new sob x 5 years prior to OV  - whereas used to be able to jog and still able to do treadmill and eliptical s difficulty with declining tol x 2 years including 2 days prior to OV  And also bad attacks that aren't directly related to exertion ? Related to eating -  better with saba but sometimes not and had to go to ER x 3 -  Severe episode Dec 15 with flu like symptoms >     Admit date: 01/30/2017 Discharge date: 02/02/2017   Discharge Diagnoses:  Principal Problem:   Acute respiratory failure with hypoxia ([place]) Active Problems:   Asthma exacerbation   Sepsis ([place])   Influenza   Anxiety   Depression with anxiety   Bronchitis    History of present illness:  Patient is a 58 year old female history of asthma, depression, anxiety, history of intermittent tobacco use over the past 20-30 years presented with fever body aches cough and shortness of breath. Patient also with complaints of productive cough of greenish sputum, chest tightness. Patient patient had been seen at the urgent care center after his symptoms are started 4 days prior to admission and was noted to be positive for the flu and discharged home on Tamiflu. Patient had taken only 3 doses prior to admission. On admission  patient was noted to have a leukocytosis, tachycardia, tachypnea, oxygen sats of 85% on room air  Hospital Course:   Acute respiratory failure with hypoxia secondary toasthma exacerbation and influenza -she was influenza positive prior to admission -improved, treated with BIPAP, Iv solumedrol, Abx, nebs -discharged home in stable condition to complete course of tamiflu and prednisone taper  2. Influenza -improving, day4 ofTamiflu now  3. Depression/anxiety Stable. Continue Lexapro. Xanax PRN continued   At home since d/c continues with doe but MMRC1 = can walk nl pace, flat grade, can't hurry or go uphills or steps s sob  / rare noct need for saba/ mostly daytime  rec Pantoprazole (protonix) 40 mg   Take  30-60 min before first meal of the day and Pepcid (famotidine)  20 mg one @  bedtime until return to office - this is the best way to tell whether stomach acid is contributing to your problem.   GERD diet  Plan A = Automatic =  Symbicort 160 Take 2 puffs first thing in am and then another 2 puffs about 12 hours later.  continue singulair for now Work on inhaler technique:  Plan B = Backup Only use your albuterol (Proair) as a rescue medication       03/25/2017  f/u ov/[facility] re:  GOLD II copd  Chief Complaint  Patient presents with  . Follow-up  PFTs today, feeling better but still having SOB  treadmill up to 30 min s stopping varies speed up to 5 mph Cough: not problem Sleep: no resp c/o's  Still having overt hb/ indigestion even on gerd rx  rec No change / f/u in 6 weeks > did not do    01/22/2019 extended  f/u ov/[facility]ert re: re-establish GOLD II copd/ not using symbicort  consistently  Chief Complaint  Patient presents with  . Follow-up    last seen 03/2017.  pt c/o increased sob with exertion, chest tightness at rest and with exertion, occasional nonprod cough qhs X3 mos.     stopped symbicort for months (still waking up a couple of times a week needing albuterol  even when on the 80 strength )  then worse so restarted back on symb 160 but not  completely better even on symbicort and even p quit smoking Dyspnea:  Last working out 15 min on stairmaster 2 weeks prior to OV  With trainer hearing wheezing despite symb first Cough: occ at hs dry but doesn't wake her up  Sleeping: twice a week 4 am wakes up wheezing sometimes resolves s rx  SABA use: avg 2-3 x per week 02: none    No obvious day to day or daytime variability or assoc excess/ purulent sputum or mucus plugs or hemoptysis or cp or chest tightness, subjective wheeze or overt sinus or hb symptoms.    Also denies any obvious fluctuation of symptoms with weather or environmental changes or other aggravating or alleviating factors except as outlined above   No unusual exposure hx or h/o childhood pna/ asthma or knowledge of premature birth.  Current Allergies, Complete Past Medical History, Past Surgical History, Family History, and Social History were reviewed in [employer] record.  ROS  The following are not active complaints unless bolded Hoarseness, sore throat, dysphagia, dental problems, itching, sneezing,  nasal congestion or discharge of excess mucus or purulent secretions, ear ache,   fever, chills, sweats, unintended wt loss or wt gain, classically pleuritic or exertional cp,  orthopnea pnd or arm/hand swelling  or leg swelling, presyncope, palpitations, abdominal pain, anorexia, nausea, vomiting, diarrhea  or change in bowel habits or change in bladder habits, change in stools or change in urine, dysuria, hematuria,  rash, arthralgias, visual complaints, headache, numbness, weakness or ataxia or problems with walking or coordination,  change in mood = anxiety or  memory.        Current Meds  Medication Sig  . albuterol (PROVENTIL HFA;VENTOLIN HFA) 108 (90 Base) MCG/ACT inhaler Inhale 2 puffs into the lungs every 6 (six) hours as needed for wheezing or shortness of  breath.   . diphenhydrAMINE (BENADRYL) 25 MG tablet Take 25 mg by mouth daily as needed for allergies.  Marland Kitchen escitalopram (LEXAPRO) 10 MG tablet Take 10 mg by mouth every evening.                     Objective:   Physical Exam    amb wf nad   01/22/2019        169  03/25/2017          164   02/24/17 161 lb (73 kg)  02/02/17 154 lb (69.9 kg)  06/21/16 159 lb 12.8 oz (72.5 kg)      Vital signs reviewed - Note on arrival 02 sats  98% on RA      HEENT : pt wearing mask not removed for  exam due to covid - 19 concerns.   NECK :  without JVD/Nodes/TM/ nl carotid upstrokes bilaterally   LUNGS: no acc muscle use,  Min barrel  contour chest wall with bilateral  slightly decreased bs s audible wheeze and  without cough on insp or exp maneuvers and min  Hyperresonant  to  percussion bilaterally     CV:  RRR  no s3 or murmur or increase in P2, and no edema   ABD:  soft and nontender with pos end  insp Hoover's  in the supine position. No bruits or organomegaly appreciated, bowel sounds nl  MS:   Nl gait/  ext warm without deformities, calf tenderness, cyanosis or clubbing No obvious joint restrictions   SKIN: warm and dry without lesions    NEURO:  alert, approp, nl sensorium with  no motor or cerebellar deficits apparent.         CXR PA and Lateral:   01/22/2019 :    I personally reviewed images and agree with radiology impression as follows:     Minimal chronic bronchitic changes without infiltrate.         Assessment:

## 2019-01-23 ENCOUNTER — Telehealth: Payer: Self-pay | Admitting: Internal Medicine

## 2019-01-23 NOTE — Telephone Encounter (Signed)
  Advised pt of results. Pt understood and nothing further is needed.    Notes recorded by Tanda Rockers, MD on 01/22/2019 at 2:14 PM EST  Call pt: Reviewed cxr and no acute change so no change in recommendations made at [facility]

## 2019-01-24 ENCOUNTER — Encounter: Payer: Self-pay | Admitting: Internal Medicine

## 2019-01-24 NOTE — Assessment & Plan Note (Signed)
Spirometry 02/24/2017  FEV1 0.93 (28%)  Ratio 81 poor tracing for f/v but curved on effort indep portion - 02/24/2017   try symb 160 2bid   - Allergy profile 02/24/17 >  Eos 0.3 /  IgE  206 RAST pos cat > dog - Alpha one AT screen  02/24/17  Level 144  MM  - PFT's  03/25/2017  FEV1 2.45  (75 % ) ratio 66  p 10  % improvement from saba p 10 prior to study with DLCO  75 % corrects to 76 % for alv volume  s symbicort am of visit - 03/25/2017  After extensive coaching inhaler device  effectiveness =    75% > continue symb 160 2bid - 01/22/2019  After extensive coaching inhaler device,  effectiveness =    80% (short ti)   She has a significant asthmatic component and has not been using symbicort consistently or in high enough doses to eliminate this component but if she does not improve to baseline on symb 160 either needs a lama added or Breztri depending on insurance  Pt informed of the seriousness of COVID 19 infection as a direct risk to their health  and safey and to those of their loved ones and should continue to wear facemask in public and minimize exposure to public locations but especially avoid any area or activity where non-close contacts are not observing distancing or wearing an appropriate face mask.   >>> f/u in 2 weeks if not improved, otherwise we can see her in 6 months when covid 19 restrictions may be resolved and can do full repeat pfts   I had an extended discussion with the patient reviewing all relevant studies completed to date and  lasting 25 minutes of a 40  minute extended office visit to re-establish with me p almost 2  years absence     re  severe non-specific but potentially very serious refractory respiratory symptoms of uncertain and potentially multiple  etiologies.  I performed device teaching  using a teach back technique which also  extended face to face time for this visit (see above)   Each maintenance medication was reviewed in detail including most importantly the  difference between maintenance and prns and under what circumstances the prns are to be triggered using an action plan format that is not reflected in the computer generated alphabetically organized AVS.    Please see AVS for specific instructions unique to this office visit that I personally wrote and verbalized to the the pt in detail and then reviewed with pt  by my nurse highlighting any changes in therapy/plan of care  recommended at today's visit.

## 2019-03-29 ENCOUNTER — Encounter: Payer: Self-pay | Admitting: Neurology

## 2019-04-23 ENCOUNTER — Telehealth: Payer: Self-pay

## 2019-04-23 DIAGNOSIS — K219 Gastro-esophageal reflux disease without esophagitis: Secondary | ICD-10-CM | POA: Insufficient documentation

## 2019-04-23 NOTE — Telephone Encounter (Signed)
Updated health information for upcoming new patient appointment.

## 2019-04-24 NOTE — Progress Notes (Signed)
Subjective:   Rachael Peters was seen in consultation in the movement disorder clinic at the request of Leonard Downing, *.  The evaluation is for tremor.  Tremor started approximately 8 months and involves the head and L hand.  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   The L hand started before the head.  She noted the L hand when she would tweeze the eyebrows.  Then, about 8 months ago, her husband noticed that her head would shake in the "yes" direction.  She also noted around that time that the fingers in the L hand started going numb.  She is L handed.  She cannot say if it is all the fingers.  She doesn't wake up with it that way but it happens daily.  She feels that she is weaker than she was.  She notes lifting up weights with training that her L hand will shake.    Affected by caffeine:  Unknown (drinks mellow yellow - 16-20 oz per day - she has decreased it) Affected by alcohol:  unknown (drinks only 1 time per month max) Affected by stress:  Yes.   Affected by fatigue:  Yes.   (increases the head tremor) Spills glass of liquid if full:  No. Affects ADL's (tying shoes, brushing teeth, etc):  No.  Current/Previously tried tremor medications: n/a  Current medications that may exacerbate tremor:  Albuterol (uses rarely); symbicort (been on it x 2.5 years - doesn't know if changes tremor)  Outside reports reviewed: historical medical records, lab reports, office notes and referral letter/letters.  Has not had neuroimaging of the brain  Also c/o memory change.  Has trouble with focus/concentration.  As a child met developmental milestones at appropriate ages.  No hx of tics  Living situation:  Pt lives with their spouse.  The patient does do the finances in the home.  She has some trouble with that recently (wrote check incorrectly) and that is unusual.  She used to spend time balancing checkbook but doesn't now  (also unusual for her).  The patient does drive.   There have not been any motor vehicle accidents in the recent years.  The patient does  cook.  She has forgot she has water boiling on the stove.  She will put laundry in the washer and forget she put it in the washer and forget to put it on.    ADL's:  The patient is able to perform her own ADL's. The patient self medicates.The patients bladder and bowel are  under good control.    Allergies  Allergen Reactions  . Codeine Nausea Only    Current Outpatient Medications  Medication Instructions  . budesonide-formoterol (SYMBICORT) 160-4.5 MCG/ACT inhaler Take 2 puffs first thing in am and then another 2 puffs about 12 hours later.   . diphenhydrAMINE (BENADRYL) 25 mg, Oral, Daily PRN  . escitalopram (LEXAPRO) 10 mg, Oral, Every evening     Objective:   VITALS:   Vitals:   04/26/19 0941  BP: 123/84  Pulse: 61  SpO2: 100%  Weight: 176 lb (79.8 kg)  Height: _0  (1.778 m)   Gen:  Appears stated age and in NAD. HEENT:  Normocephalic, atraumatic. The mucous membranes are moist. The superficial temporal arteries are without ropiness or tenderness. Cardiovascular: Regular rate and rhythm. Lungs: Clear to auscultation bilaterally. Neck: There are no carotid bruits noted bilaterally.  NEUROLOGICAL:  Orientation:  The patient is alert and oriented x 3.   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 Extraocular muscles are intact and visual fields are full to confrontational testing. Speech is fluent and clear. Soft palate rises symmetrically and there is no tongue deviation. Hearing is intact to conversational tone. Tone: Tone is good throughout. Sensation: Sensation is intact to light touch touch throughout (facial, trunk, extremities). Vibration is intact at the bilateral big toe. There is no extinction with double simultaneous stimulation. There is no sensory dermatomal level  identified. Coordination:  The patient has no dysdiadichokinesia or dysmetria. Motor: Strength is 5/5 in the bilateral upper and lower extremities.  Shoulder shrug is equal bilaterally.  There is no pronator drift.  There are no fasciculations noted. DTR's: Deep tendon reflexes are 2/4 at the bilateral biceps, triceps, brachioradialis, patella and achilles.  Plantar responses are downgoing bilaterally. Gait and Station: The patient is able to ambulate without difficulty. The patient is able to heel toe walk without any difficulty. The patient is able to ambulate in a tandem fashion. The patient is able to stand in the Romberg position.   MOVEMENT EXAM: Tremor:  There is no tremor, either intention or postural.  The patient is able to draw Archimedes spirals without significant difficulty.  There is no tremor at rest.  No head tremor is seen   I have reviewed and interpreted the following labs independently Patient had lab work from primary care on December 27, 2018.  White blood cells were 6.0, hemoglobin 13.6, hematocrit 40.9 and platelets 200.  Sodium was 143, potassium 3.7, chloride 102, CO2 25, BUN 14, creatinine 0.96, TSH is 0.880, AST 19, ALT 16, alkaline phosphatase 92.   Assessment/Plan:   1.  Tremor, by history.  -I did not see any head tremor today.  Did not notice any evidence of cervical dystonia.  Did not see any evidence of essential tremor.  Reassured her that I did not see any evidence of Parkinson's disease.  2.  Left hand paresthesias  -This could be a result of entrapment neuropathy such as carpal tunnel.  She is left hand dominant.  Discussed doing EMG testing.  She wants to hold on that for now.  She will let me know if she changes her mind  3.  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  She and I talked about this concept.  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  Since that time, she has developed multiple symptoms, including trouble with focus and concentration, and memory changes.  Decided to go ahead and pursue neurocognitive testing.  -Discussed MRI of the brain.  She is very claustrophobic.  She declined.  Neuro exam was nonfocal and nonlateralizing today.  4.  F/u depending on the results of the above.  She will get the results of her neurocognitive testing directly from the neuropsychologist.  Total time spent on today's visit was 60 minutes of face to face time  CC:  Leonard Downing, MD

## 2019-04-26 ENCOUNTER — Other Ambulatory Visit: Payer: Self-pay

## 2019-04-26 ENCOUNTER — Ambulatory Visit (INDEPENDENT_AMBULATORY_CARE_PROVIDER_SITE_OTHER): Payer: BC Managed Care – PPO | Admitting: Neurology

## 2019-04-26 ENCOUNTER — Encounter: Payer: Self-pay | Admitting: Neurology

## 2019-04-26 VITALS — BP 123/84 | HR 61 | Ht 70.0 in | Wt 176.0 lb

## 2019-04-26 DIAGNOSIS — R413 Other amnesia: Secondary | ICD-10-CM

## 2019-04-26 DIAGNOSIS — R251 Tremor, unspecified: Secondary | ICD-10-CM | POA: Diagnosis not present

## 2019-04-26 DIAGNOSIS — R202 Paresthesia of skin: Secondary | ICD-10-CM

## 2019-04-26 NOTE — Patient Instructions (Signed)
You have been referred for a neurocognitive evaluation in our office.   The evaluation has two parts.   . The first part of the evaluation is a clinical interview with the neuropsychologist (Dr. Merz or Dr. Stewart). Please bring someone with you to this appointment if possible, as it is helpful for the doctor to hear from both you and another adult who knows you well.   . The second part of the evaluation is testing with the doctor's technician (Dana or Kim). The testing includes a variety of tasks- mostly question-and-answer, some paper-and-pencil. There is nothing you need to do to prepare for this appointment, but having a good night's sleep prior to the testing, taking medications as you normally would, and bringing eyeglasses and hearing aids (if you wear them), is advised. Please make sure that you wear a mask to the appointment.  Please note: We have to reserve several hours of the neuropsychologist's time and the psychometrician's time for your evaluation appointment. As such, please note that there is a No-Show fee of $100. If you are unable to attend any of your appointments, please contact our office as soon as possible to reschedule.  

## 2019-05-04 ENCOUNTER — Ambulatory Visit: Payer: BC Managed Care – PPO | Attending: Internal Medicine

## 2019-05-04 DIAGNOSIS — Z23 Encounter for immunization: Secondary | ICD-10-CM

## 2019-05-04 NOTE — Progress Notes (Signed)
   Covid-19 Vaccination Clinic  Name:  Rachael Peters    MRN: [id number] DOB: 07/07/1960  05/04/2019  [name] was observed post Covid-19 immunization for 15 minutes without incident. She was provided with Vaccine Information Sheet and instruction to access the V-Safe system.   [name] was instructed to call 911 with any severe reactions post vaccine: Marland Kitchen Difficulty breathing  . Swelling of face and throat  . A fast heartbeat  . A bad rash all over body  . Dizziness and weakness   Immunizations Administered    Name Date Dose VIS Date Route   Pfizer COVID-19 Vaccine 05/04/2019  3:14 PM 0.3 mL 01/26/2019 Intramuscular   Manufacturer: [place]   Lot: [id number]   [place]: [id number]

## 2019-05-15 ENCOUNTER — Encounter: Payer: Self-pay | Admitting: Counselor

## 2019-05-15 ENCOUNTER — Ambulatory Visit: Payer: BC Managed Care – PPO

## 2019-05-15 ENCOUNTER — Other Ambulatory Visit: Payer: Self-pay

## 2019-05-15 ENCOUNTER — Ambulatory Visit (INDEPENDENT_AMBULATORY_CARE_PROVIDER_SITE_OTHER): Payer: BC Managed Care – PPO | Admitting: Counselor

## 2019-05-15 DIAGNOSIS — F09 Unspecified mental disorder due to known physiological condition: Secondary | ICD-10-CM

## 2019-05-15 DIAGNOSIS — R413 Other amnesia: Secondary | ICD-10-CM

## 2019-05-15 DIAGNOSIS — R251 Tremor, unspecified: Secondary | ICD-10-CM

## 2019-05-15 DIAGNOSIS — F4323 Adjustment disorder with mixed anxiety and depressed mood: Secondary | ICD-10-CM

## 2019-05-15 DIAGNOSIS — R41840 Attention and concentration deficit: Secondary | ICD-10-CM

## 2019-05-15 NOTE — Progress Notes (Signed)
   Psychometrist Note   Cognitive testing was administered to Tayia V Rogerson by Kimberly Shuttleworth, B.S. (Technician) under the supervision of Peter Stewart, Psy.D., ABN. Ms. Pinkney was able to tolerate all test procedures. Dr. Stewart met with the patient as needed to manage any emotional reactions to the testing procedures (if applicable). Rest breaks were offered.    The battery of tests administered was selected by Dr. Stewart with consideration to the patient's current level of functioning, the nature of her symptoms, emotional and behavioral responses during the interview, level of literacy, observed level of motivation/effort, and the nature of the referral question. This battery was communicated to the psychometrist. Communication between Dr. Stewart and the psychometrist was ongoing throughout the evaluation and Dr. Stewart was immediately accessible at all times. Dr. Stewart provided supervision to the technician on the date of this service, to the extent necessary to assure the quality of all services provided.    Ms. Brodhead will return in approximately one week for an interactive feedback session with Dr. Stewart, at which time female test performance, clinical impressions, and treatment recommendations will be reviewed in detail. The patient understands she can contact our office should she require our assistance before this time.   A total of 150 minutes of billable time were spent with Salvador V Tun by the technician, including test administration and scoring time. Billing for these services is reflected in Dr. Stewart's note.   This note reflects time spent with the psychometrician and does not include test scores, clinical history, or any interpretations made by Dr. Stewart. The full report will follow in a separate note. 

## 2019-05-15 NOTE — Progress Notes (Signed)
[facility]  Patient Name: Rachael Peters MRN: [id number] Date of Birth: 23-Nov-1960 Age: 59 y.o. Education: 16 years  Referral Circumstances and Background Information  Rachael Peters is a 59 y.o., left-hand dominant, married woman with a history of memory and thinking problems particularly over the past year or so, although she has always questioned if she has ADHD. She was seen by Dr. Carles Collet related to concerns about a tremor, although Dr. Carles Collet did not see any signs of essential tremor or Parkinsonism.  On interview, the patient stated that about two years ago, she quit work because she needed to caregive for her parents and she started to notice problems after that. Her parents are now better, she has transitioned back to work, and she is having a hard time focusing on her job. She admits that leaving her job was a very difficult decision for her and she didn't realize how much she would miss it until she quit. 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 She described her previous job as going from point a, to b, to c and very fast paced, so she never had to keep her mind on one thing for very long. 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 She  has noticed day-to-day forgetfulness apart from work such as leaving pots on the stove, putting clothes in the washing machine and forgetting to turn it on, and in general it sounds like she perceives herself as absent minded now. She has written a few checks the wrong way.   The patient's husband thinks she probably has ADHD but became concerned about progressive causes of cognitive decline when they started noticing her tremor (patient reports  left hand tremor and yes direction head tremor, about 3-4 times per week). He describes her as always having a great memory in the past and he feels like she isn't that way now. She doesn't forget things rapidly although over the course of several days or over the course of the day, she may forget conversations they have had. With respect to mood, the patient's husband says that she is a bit more irritable. The patient feels like she is less vibrant than in the past. She also is somewhat less social and has less interest in things (and people). The patient described herself as feeling "empty" but she does not feel the term depression fits her experience. She thinks that her energy is not as good as it was in the past. The patient stated that she feels "more withdrawn than I have in my whole life," with the exception of when she lost a child. She isn't sleeping well, typically 4 to 6 hours a night, and that is over the past year. Sometimes she will get up early and can't get back to sleep and other times she can't go to sleep. She stated that she has also gained some weight, despite not eating more, about 10-12lbs over the past 9 months.   With respect to function, the patient is working part time, about 20 to 25 hours a week and she helps care for her parents roughly 3 days a week. She stated that her job is "the  most difficult thing I have done in my life," and it sounds like she finds it stultifying. She also gets distracted and thinks about the issues above. She would have quit the position long ago but she feels like she made a commitment and needs to follow through. She works from home and it sounds like that is also difficult. Other than being less efficient and somewhat forgetful, she is not unable to do anything that she did previously. No one at work has commented on her making errors or performing in an unsatisfactory manner. She is functioning at her typical level around the house, such as when  preparing meals, doing the finances, with driving, and doing medications.   Past Medical History and Review of Relevant Studies   Patient Active Problem List   Diagnosis Date Noted  . GERD (gastroesophageal reflux disease) 04/23/2019  . Neoplasm of right kidney 04/20/2018  . COPD  GOLD II  02/25/2017  . Dyspnea on exertion 02/24/2017  . Bronchitis 01/31/2017  . Asthma exacerbation 01/30/2017  . Sepsis ([place]) 01/30/2017  . Influenza 01/30/2017  . Depression with anxiety 01/30/2017  . Acute respiratory failure with hypoxia ([place]) 01/30/2017  . Anxiety    Review of Neuroimaging and Relevant Studies:  The patient has no neuroimaging; MRI was recommended but the patient is claustrophobic and wished to complete testing first.   Dr. Doristine Devoid office visit note was reviewed and is appreciated; briefly, patient had a normal elemental exam and no signs of essential tremor or Parkinsonism.   Current Outpatient Medications  Medication Sig Dispense Refill  . budesonide-formoterol (SYMBICORT) 160-4.5 MCG/ACT inhaler Take 2 puffs first thing in am and then another 2 puffs about 12 hours later. (Patient taking differently: Two puffs once a day) 1 Inhaler 12  . diphenhydrAMINE (BENADRYL) 25 MG tablet Take 25 mg by mouth daily as needed for allergies.    Marland Kitchen escitalopram (LEXAPRO) 10 MG tablet Take 10 mg by mouth every evening.   12   No current facility-administered medications for this visit.    Family History  Problem Relation Age of Onset  . Heart disease Maternal Grandmother   . Heart disease Maternal Grandfather   . Diabetes Maternal Grandfather   . Heart disease Paternal Grandmother   . Heart disease Paternal Grandfather   . Breast cancer Mother 81  . Breast cancer Sister 61  . Heart disease Father   . Stroke Father   . Heart attack Father     There is no  family history of dementia. She has a brother who is 58 years old and a sister who is 36 and they are both in good cognitive health.  There is no  family history of psychiatric illness.  Psychosocial History  Developmental, Educational and Employment History: With regard to early childhood history, the patient was apparently very active as a child and "talked constantly." That behavior did come to the attention of her teachers who frequently commented on it in her report cards. She also got bored very easily. It sounds like she has always been very good with organization, and never had difficulties misplacing her school work. She did not have any behavior problems although she did have a hard time sitting in her chair and teachers commented on it. Her mother said she was "always moving," even when she was in utero. The patient stated that she did very well in school, and earned fairly good grades. She got A's in grade school and was "average" in college (  mostly B's). She denied ever being held back or having any learning difficulties. She earned a bachelor's degree from [employer] in [employer]. The patient has typically worked in [employer] capacity, she worked for [employer] for 20 years and then switched to [employer], where she has worked for 12 years. 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   Psychiatric History: The patient takes Lexapro, which she has been taking for "years" related to anxiety and being "high strung." She has tried several different medications and that works the best for her. She denied any consultation with psychiatrists although she did see a psychologist briefly after she lost a child.   Substance Use History: The patient doesn't regularly consume alcohol. She was a smoker and quit 15 months ago. She also quit drinking because she felt like that was a trigger, she denied having any alcohol use issues. She doesn't use any illicit substances.   Relationship History and Living  Cimcumstances: The patient and her husband have been married for 27 years. They have no children. They tried to have children at the beginning of their marriage but lost two children and realized it wasn't in the cards for them.   Mental Status and Behavioral Observations  Sensorium/Arousal: The patient's level of arousal was awake and alert.  Orientation: The patient was fully oriented to person, place time, and situation.  Appearance: Dressed neatly in appropriate, casual clothing Behavior: Pleasant, appropriate, and outwardly appeared compliant with all testing procedures.  Speech/language: The patient's speech was normal in rate, rhythm, and volume. No hypophonia or difficulties with speech praxis noted.  Gait/Posture: Normal on last exam with Dr. Carles Collet Movement: Deferred to neurology Social Comportment: The patient was pleasant and appropriate Mood: The patient does not describe her mood as depressed but does report feeling sad, withdrawn, and having anxiety Affect: Mildly dysphoric when discussing her affective issues, otherwise neutral Thought process/content: The patient's thought process was logical, linear, and goal oriented. She had no difficulties supplying a fairly detailed and accurate personal history and timeline.  Safety: No thoughts of harming self or others were identified on direct questioning.  Insight: [place] Abide Cognitive Assessment  05/15/2019  Visuospatial/ Executive (0/5) 5  Naming (0/3) 3  Attention: Read list of digits (0/2) 2  Attention: Read list of letters (0/1) 1  Attention: Serial 7 subtraction starting at 100 (0/3) 3  Language: Repeat phrase (0/2) 1  Language : Fluency (0/1) 0  Abstraction (0/2) 2  Delayed Recall (0/5) 2  Orientation (0/6) 6  Total 25  Adjusted Score (based on education) 25    Test Procedures  Wide Range Achievement Test - 4 Word Reading Doy Mince' Intellectual Screening Test Wechsler Adult Intelligence Scale - IV  Digit  Span  Arithmetic  Symbol Search  Coding Neuropsychological Assessment Battery  Memory Module  Naming Repeatable Battery for the Assessment of Neuropsychological Status (Form A)  Figure Copy  Judgment of Line Orientation  Figure Recall ACS Word Choice The Dot Counting Test Controlled Oral Word Association (F-A-S) Semantic Fluency (Animals) Trail Making Test A & B Wisconsin Card Sorting Test - 64 (Computer Version) Patient Health Questionnaire - 9 GAD-7 Adult ADHD Self-Report Scale Quick Dementia Rating System (Completed by husband, Mortimer Fries)  Plan  Casey Negro Lennartz was seen for a psychiatric diagnostic evaluation and neuropsychological testing. 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 She admits that her mood is off and I think that adjustment issues are contributing significantly to her presentation but may not be the only thing going on. She does well on cognitive screening and presents as having at most a mild level of cognitive difficulty. Full and complete note with impressions, recommendations, and interpretation of test data to follow.   Viviano Simas Nicole Kindred, PsyD, [place] Clinical Neuropsychologist  Informed Consent and Coding/Compliance  Risks and benefits of the evaluation were discussed with the patient as were the limits of confidentiality. I conducted a clinical interview and neuropsychological testing (more than two tests) with Fabio Asa and Lamar Benes, B.S. (Technician) assisted me in administering additional test procedures. The patient was able to tolerate the testing procedures and the patient (and/or family if applicable) is likely to benefit from further follow up to receive the diagnosis and treatment recommendations, which will be rendered at the next encounter. Billing below reflects technician time,  my direct face-to-face time with the patient, time spent in test administration, and time spent in professional activities including but not limited to: neuropsychological test interpretation, integration of neuropsychological test data with clinical history, report preparation, treatment planning, care coordination, and review of diagnostically pertinent medical history or studies.   Services associated with this encounter: Clinical Interview [phone]) plus 60 minutes [id number]; Neuropsychological Evaluation by Professional)  180 minutes [id number]; Neuropsychological Evaluation by Professional, Adl.) 30 minutes [id number]; Test Administration by Professional) 30 minutes [id number]; Neuropsychological Testing by Technician) 120 minutes [id number]; Neuropsychological Testing by Technician, Adl.)

## 2019-05-21 NOTE — Progress Notes (Signed)
[facility]  Patient Name: Rachael Peters MRN: [id number] Date of Birth: 1961/01/19 Age: 59 y.o. Education: 16 years  Measurement properties of test scores: IQ, Index, and Standard Scores (SS): Mean = 100; Standard Deviation = 15 Scaled Scores (Ss): Mean = 10; Standard Deviation = 3 Z scores (Z): Mean = 0; Standard Deviation = 1 T scores (T); Mean = 50; Standard Deviation = 10  TEST SCORES:    Note: This summary of test scores accompanies the interpretive report and should not be considered in isolation without reference to the appropriate sections in the text. Test scores are relative to age, gender, and educational history as available and appropriate.   Performance Validity        ACS: Raw  Descriptor      Word Choice 48 Within Expectation       Raw  Descriptor  The Dot Counting Test: 9 Within Expectation      Embedded Measures: Raw  Descriptor      WAIS-IV Reliable Digit Span 9 Within Expectation      WAIS-IV Reliable Digit Span Revised 14 Within Expectation      Mental Status Screening     Total Score Descriptor  MoCA 25 Within Normal Limits      Expected Functioning        Wide Range Achievement Test: Standard/Scaled Score Percentile      Word Reading 109 73      Reynolds Intellectual Screening Test Standard/T-score Percentile      Guess What 39 14      Odd Item Out 54 66  RIST Index 95 37      Attention/Processing Speed        Wechsler Adult Intelligence Scale - IV: Standard/Scaled Score Percentile  Working Memory Index 105 63      Digit Span 10 50          Digit Span Forward 8 25          Digit Span Backward 11 63          Digit Span Sequencing 11 63      Arithmetic 12 75  Processing Speed Index 100 50      Symbol Search 8 25      Coding 12 75      Language        Neuropsychological Assessment Battery (Language Module, Form 1): T-score Percentile     Naming 53 62      Verbal Fluency:         Controlled  Oral Word Association (F-A-S) 41 18      Semantic Fluency (Animals) 45 31      Memory:        Neuropsychological Assessment Battery (Memory Module, Form 1): T-score/Scaled Score Percentile  NAB Memory Index (MEM): 80 9      List Learning           List A Immediate Recall   (4 , 7 , 9) 36 8         List B Immediate Recall   (6) 53 62         List A Short Delayed Recall   (7) 41 18         List A Long Delayed Recall   (7) 41 18         List A Long Delayed Yes/No Recognition Hits   (11) -- 58         List A  Long Delayed Yes/No Recognition False Alarms   (0) -- 79         List A Recognition Discriminability Index -- 79      Shape Learning           Immediate Recognition   (7 , 7 , 4) 50 50         Delayed Recognition   (4) 32 4         Delayed Forced-Choice Recognition Hits   (8) -- 62         Delayed Forced-Choice Recognition False Alarms   (2) -- 14     Story Learning           Immediate Recall   (19, 30) 32 4         Delayed Recall   (28) 35 7      Daily Living Memory            Immediate Recall   (24, 18) 42 21          Delayed Recall   (9, 7) 57 75          Recognition Hits   (8) -- 25      Repeatable Battery for the Assessment of Neuropsychological Status (Form A): Scaled Score Percentile         Figure Recall   (12) 8 25      Visuospatial/Constructional Functioning        Repeatable Battery for the Assessment of Neuropsychological Status (Form A): Standard/Scaled Score Percentile      Visuospatial/Constructional Index 96 39          Figure Copy 13 84          Judgment of Line Orientation -- 3-9      Executive Functioning        Wisconsin Card Sorting Test - 64: T-score Percentile      Categories -- 11-16      Total Errors 40 16      Perseverative Errors 47 39      Nonperseverative Errors 31 3      D-KEFS Color-Word Interference Test: Raw (Scaled Score) Percentile     Color Naming 28 secs. (11) 63     Word Reading 23 secs. (10) 50     Inhibition 66 secs. (9) 37         Total Errors 2 errors (9) 37     Inhibition/Switching 57 secs. (12) 75        Total Errors 2 errors (10) 50      Trail Making Test: T-Score Percentile      Part A 55 69      Part B 59 82      Boston Diagnostic Aphasia Exam: Raw Score Scaled Score      Complex Ideational Material 9 5      Clock Drawing Raw Score Descriptor      Command 10 WNL      Rating Scales        Quick Dementia Rating System Raw Score Descriptor      Sum of Boxes 2 MCI      Total Score 4 MCI  Patient Health Questionnaire - 9 10 Moderate Depression  GAD-7 12 Moderate Anxiety     Peter V. Nicole Kindred PsyD, [facility] Clinical Neuropsychologist

## 2019-05-21 NOTE — Progress Notes (Deleted)
Subjective:    Patient ID: Rachael Peters is a 59 y.o. female.  HPI {Common ambulatory SmartLinks:[id number]}  Review of Systems  Objective:  Neurological Exam  Physical Exam  Assessment:   ***  Plan:   ***

## 2019-05-22 ENCOUNTER — Other Ambulatory Visit: Payer: Self-pay

## 2019-05-22 ENCOUNTER — Ambulatory Visit (INDEPENDENT_AMBULATORY_CARE_PROVIDER_SITE_OTHER): Payer: BC Managed Care – PPO | Admitting: Counselor

## 2019-05-22 ENCOUNTER — Encounter: Payer: Self-pay | Admitting: Counselor

## 2019-05-22 DIAGNOSIS — F909 Attention-deficit hyperactivity disorder, unspecified type: Secondary | ICD-10-CM | POA: Diagnosis not present

## 2019-05-22 DIAGNOSIS — G478 Other sleep disorders: Secondary | ICD-10-CM | POA: Diagnosis not present

## 2019-05-22 DIAGNOSIS — F4323 Adjustment disorder with mixed anxiety and depressed mood: Secondary | ICD-10-CM | POA: Diagnosis not present

## 2019-05-22 NOTE — Progress Notes (Signed)
[facility]  Patient Name: Rachael Peters MRN: [id number] Date of Birth: 18-May-1960 Age: 59 y.o. Education: 16 years  Clinical Impressions  Rachael Peters is a 59 y.o., left-hand dominant, married woman with a history of suspected ADHD, anxiety, and memory and thinking problems that she notices mainly since switching jobs. 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 She has been having a hard time staying focused, feels like she doesn't remember the things that she reads, and she also has noticed general day-to-day forgetfulness such as leaving pots on the stove, putting clothing in the washer without turning it on. She also is not sleeping well and is only getting about 4 to 6 hours of sleep per night over the past year. Some mild weight gain. She is reporting a yes direction head tremor and some tremor in the hands but was not felt to be demonstrating any signs of Parkinsonism or essential tremor at her visit with Dr. Carles Collet.   Rachael Peters's neuropsychological performance profile is, for the most part, fairly benign with generally good scores in many areas including on measures of attention/working memory, processing speed, and language. She did demonstrate memory performance that is marginally below expectations for her with the profile suggesting inconsistent memory performance that may be on the basis of executive control factors as opposed to a focal memory deficit per se. This was accompanied by scattered low scores on several measures of executive abilities. She reported moderate levels of symptoms associated with anxiety and depression and does likely meet criteria for a formal diagnosis of ADHD with predominantly hyperactive symptoms from her description of them.    The overall impression is one diminished cognitive efficiency, which likely reflects a combination of premorbid  liability due to preexisting ADHD, adjustment issues, and insufficient sleep. I think that her difficulties are likely to improve and or remit with treatment and see no signs concerning for a neurodegenerative condition.   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 That is to say, you performed well in most areas as compared to your age and education matched peer group. You did demonstrate memory performance that is marginally below expectations for someone of your overall ability level although in this case, I do not think you have a primary memory problem, but that executive control factors are attenuating your memory performance, resulting in the day-to-day symptoms that you notice.   Executive control is a higher order cognitive ability involved in regulating other cognitive resources. Much like the conductor of an orchestra coordinates multiple instruments to make music, executive capacities coordinate other lower-order skills (e.g., movement, language, attention) to form complex human behaviors. Individuals with executive control problems are often capable of doing most of the things they did before they were having problems, but they may not do so as effortlessly, efficiently, and consistently. These difficulties often manifest as problems tracking information, multitasking, and paying attention. Executive control problems often result in cognitive inefficiency and can present as "memory problems," because they decrease encoding and spontaneous retrieval of information.   In your case, I think that your executive control difficulties are multifactorial. They are most likely related to a combination of factors and can be improved or  entirely prevented  if we can address the underlying factors contributing.   First, you likely have a susceptibility to executive control problems and/or some preexisting executive control issues because I do think your history and current symptoms suggest that you have Attention-Deficit/Hyperactivity disorder, predominately hyperactive type. ADHD is a complex condition that can present in many different ways. Your condition appears to involve mainly hyperactive symptoms, which can often present in adult hood as difficulties sitting for long periods of time, avoiding cognitively demanding work, and an internal sensation of disquiet/discomfort. This is likely interacting with your job, which requires a great deal of discipline and focus, and makes it difficult for you to perform.   For problems with attention and concentration, consider the following recommendations: . Build routines into your day and stick to them, doing the same thing at the same time helps your body get into a rhythm that makes it harder to forget something you need to do.  . Use sticky notes, reminders, a calendar, or your smart phone to provide yourself with reminders, to do lists, and help track appointments.  . When you need to do work for school or other things, create an environment that is conducive to that work. This may include putting electronic devices that can be distracting outside the room and working in an area that is quiet and free from distractions.  . Break things down into smaller steps to help get started and stop yourself from feeling overwhelmed.  . Plan breaks throughout the day where you can get up and move even if it's for just a few minutes at a time.  . Focus your attention on only one thing and avoid multitasking. Although some people are better at it than others, nobody's task performance is as good as it could be when they are alternating attention between two different tasks.  . Stay mindful throughout your day and monitor  whether you are feeling overwhelmed or disorganized to identify problems before they happen.  . Avoid working under time pressure when you may be more liable to make mistakes.   With regard to medication, I think that in your case it is best if you can manage your symptoms behaviorally. Most ADHD medication has a stimulant component and thus, treatment with medications may exacerbate your anxiety and could contribute to further sleep problems. You have also been able to function at a high level most of your life without medication. With that said, if your symptoms become particularly impairing or detrimental to your functioning, it would not be unreasonable to consider a trial of medication.   You reported moderate levels of symptoms typically associated with anxiety and depression, which in your case are probably best characterized as "adjustment issues." Adjustment issues are transient difficulties with mood, anxiousness, and the like that are related to changes in your life. You are already taking medication but I think that psychotherapy to talk about some of these things might also be helpful. A psychologist or other mental health professional familiar with ADHD might be particularly helpful because they could also help you implement behavioral strategies to help perform better day-to-day.   You are also not sleeping well. There are few things as disruptive to brain functioning as not getting a good night's sleep. For sleep, I recommend against using medications, which can have lingering sedating effects on the brain and rob your brain of restful REM sleep. Instead, consider trying some of the following sleep hygiene recommendations. They may not work at  once and may take effort, but the effort you spend is likely to be rewarded with better sleep eventually:  . Stick to a sleep schedule of the same bedtime and wake time even on the weekends, which can help to regulate your body's internal clock so that  you fall asleep and stay asleep.  . Practice a relaxing bedtime ritual (conducted away from bright lights) which will help separate your sleep from stimulating activities and prepare your body to fall asleep when you go to bed.  . Avoid naps, especially in the afternoon.  . Evaluate your room and create conditions that will promote sleep such as keeping it cool (between 60 - 67 degrees), quiet, and free from any lights. Consider using blackout curtains, a "white noise" generator, or fan that will help mask any noises that might prevent you from going to sleep or awaken you during the night.  . Sleep on a comfortable mattress and pillows.  . Avoid bright light in the evening and excessive use of portable electronic devices right before bed that may contain light frequencies that can contribute to sleep problems.  . Avoid alcohol, cigarettes, or heavy meals in the evening. If you must eat, consume a light snack 45 minutes before bed.  . Use your bed only for sleep to strengthen the association between your bed and sleep.  . If you can't go to sleep within 30 minutes, go into another room and do something relaxing until you feel tired. Then, come back and try to go to sleep again for 30 minutes and repeat until sleep is achieved.  . Some people find over the counter melatonin to be helpful for sleep, which you could discuss with a pharmacist or prescribing provider.   I would like to reassure you that I do not see any signs of an early degenerative illness or other underlying neurologic cause for your symptoms. I think your symptoms will improve and or completely remit with treatment of the underlying causes listed above.   Test Findings  Test scores are summarized in additional documentation associated with this encounter. Test scores are relative to age, gender, and educational history as available and appropriate. There were no concerns about performance validity as all findings fell within normal  expectations.   General Intellectual Functioning/Achievement:  Performance on single word reading fell toward the upper aspect of the average range. Overall cognitive ability fell at an average level on the RIST index, with low average performance on the more verbally mediated subtest and average performance on the more visually oriented subtest.   Attention and Processing Efficiency: Performance on measures of attention and working memory fell at a reasonably expected level for Ms. Kistler with an average score overall on the Working Memory Index of the WAIS-IV. Good average range performance was demonstrated on measures of digit repetition forward, backward, and digit resequencing in ascending order. She performed at an average range solving arithmetical word problems without paper and pencil.   With respect to processing efficiency and psychomotor speed, Ms. Kilian performed at an average level on the Processing Speed index of the WAIS-IV. Consistent average range performance was demonstrated on two different measures of efficient visual scanning and efficient visual matching.   Language: Language findings were within normal limits with normal visual object confrontation naming. Sensitive fluency measures generated a low average score in the phonemic modality and an average score in the semantic modality.   Visuospatial Function: Performance fell at a reasonable average level on the  visuospatial/constructional index of the RBANS. Judgment of angular line orientations was weak and fell at an unusually low range, although her figure copy performance was very good and thus the significance of this finding is unclear.   Learning and Memory: Performance was marginally below expectations for an individual of Ms. Speedy's overall ability on the NAB Memory Module, although that is in the context of reasonable low-average range performance overall. The profile shows inconsistent memory as opposed to a  circumscribed deficit in any area and is likely best explained by executive control problems.   In the verbal realm, immediate recall for a 12-item word list across three trials was unusually low although Ms. Ruperto retained that information well and achieved low average scores on short and long delayed free recall. Her yes/no recognition discrimination for the items in the list versus false choices was also good and fell at an average level. Memory for a short story was unusually low after two repetitions but once again, retention was good and delayed recall was unusually low (due to the amount of information initially encoded). She performed a bit better when learning brief daily-living type information consisting of medication instructions and a name, address, and phone number. Immediate recall was low average and delayed recall was high average.   In the visual realm, performance was average when learning a series of designs that are difficult to verbally encode. Her retention of that information across time was weak though and she achieved an unusually low delayed recognition score. She did better using a yes/no recognition format with a low average score for discriminating target designs from false choices. Delayed recall of a modestly complex figure was average.   Executive Functions: Performance across the test battery suggested some difficulties with performance consistency and cognitive efficiency, which can be understood as facets of executive control. On dedicated measures her scores were variable. She performed at a low average level with respect to categories completed and total errors on the [facility]. Her perseverative errors score was a bit better and fell at an average level. These findings mean that she had a hard time discerning solution categories although she did not demonstrate an overly rigid ineffective manner in terms of how she approached the test. She also  achieved an unusually low score when answering a series of yes/no questions on a measure integrating a number of different cognitive skills. She did better when inhibiting a dominant response in favor of a novel response and when switching between providing a novel response and a habitual response on the Color-Word Subtest of the DKEFS. Alternating sequencing of numbers and letters of the alphabet was high average. She performed at a low average level when generating words on the basis of letter cues.   Rating Scale(s): Ms. Jobst reported moderate levels of symptoms associated with depression and anxiety. She endorsed items in such a manner on the Adult ADHD Self-Report scale as to suggest that she does likely meet criteria for a formal diagnosis of ADHD in the context of her clinical history.   Rachael Peters Rachael Kindred PsyD, [place] Clinical Neuropsychologist

## 2019-05-22 NOTE — Patient Instructions (Signed)
Your performance and presentation on assessment were for the most part fairly benign. That is to say, you performed well in most areas as compared to your age and education matched peer group. You did demonstrate memory performance that is marginally below expectations for someone of your overall ability level although in this case, I do not think you have a primary memory problem, but that executive control factors are attenuating your memory performance, resulting in the day-to-day symptoms that you notice.   Executive control is a higher order cognitive ability involved in regulating other cognitive resources. Much like the conductor of an orchestra coordinates multiple instruments to make music, executive capacities coordinate other lower-order skills (e.g., movement, language, attention) to form complex human behaviors. Individuals with executive control problems are often capable of doing most of the things they did before they were having problems, but they may not do so as effortlessly, efficiently, and consistently. These difficulties often manifest as problems tracking information, multitasking, and paying attention. Executive control problems often result in cognitive inefficiency and can present as "memory problems," because they decrease encoding and spontaneous retrieval of information.   In your case, I think that your executive control difficulties are multifactorial. They are most likely related to a combination of factors and can be improved or entirely prevented if we can address the underlying factors contributing.   First, you likely have a susceptibility to executive control problems and/or some preexisting executive control issues because I do think your history and current symptoms suggest that you have Attention-Deficit/Hyperactivity disorder, predominately hyperactive type. ADHD is a complex condition that can present in many different ways. Your condition appears to involve mainly  hyperactive symptoms, which can often present in adult hood as difficulties sitting for long periods of time, avoiding cognitively demanding work, and an internal sensation of disquiet/discomfort. This is likely interacting with your job, which requires a great deal of discipline and focus, and makes it difficult for you to perform.   For problems with attention and concentration, consider the following recommendations:  Build routines into your day and stick to them, doing the same thing at the same time helps your body get into a rhythm that makes it harder to forget something you need to do.   Use sticky notes, reminders, a calendar, or your smart phone to provide yourself with reminders, to do lists, and help track appointments.   When you need to do work for school or other things, create an environment that is conducive to that work. This may include putting electronic devices that can be distracting outside the room and working in an area that is quiet and free from distractions.   Break things down into smaller steps to help get started and stop yourself from feeling overwhelmed.   Plan breaks throughout the day where you can get up and move even if it's for just a few minutes at a time.   Focus your attention on only one thing and avoid multitasking. Although some people are better at it than others, nobody's task performance is as good as it could be when they are alternating attention between two different tasks.   Stay mindful throughout your day and monitor whether you are feeling overwhelmed or disorganized to identify problems before they happen.   Avoid working under time pressure when you may be more liable to make mistakes.   With regard to medication, I think that in your case it is best if you can manage your symptoms behaviorally.  Most ADHD medication has a stimulant component and thus, treatment with medications may exacerbate your anxiety and could contribute to further  sleep problems. You have also been able to function at a high level most of your life without medication. With that said, if your symptoms become particularly impairing or detrimental to your functioning, it would not be unreasonable to consider a trial of medication.   You reported moderate levels of symptoms typically associated with anxiety and depression, which in your case are probably best characterized as "adjustment issues." Adjustment issues are transient difficulties with mood, anxiousness, and the like that are related to changes in your life. You are already taking medication but I think that psychotherapy to talk about some of these things might also be helpful.   I made a referral for counseling, which could be helpful not only for mood and adjustment issues but also for behavioral sleep and ADHD management strategies.   You are also not sleeping well. There are few things as disruptive to brain functioning as not getting a good night's sleep. For sleep, I recommend against using medications, which can have lingering sedating effects on the brain and rob your brain of restful REM sleep. Instead, consider trying some of the following sleep hygiene recommendations. They may not work at once and may take effort, but the effort you spend is likely to be rewarded with better sleep eventually:   Stick to a sleep schedule of the same bedtime and wake time even on the weekends, which can help to regulate your body's internal clock so that you fall asleep and stay asleep.   Practice a relaxing bedtime ritual (conducted away from bright lights) which will help separate your sleep from stimulating activities and prepare your body to fall asleep when you go to bed.   Avoid naps, especially in the afternoon.   Evaluate your room and create conditions that will promote sleep such as keeping it cool (between 60 - 67 degrees), quiet, and free from any lights. Consider using blackout curtains, a white  noise generator, or fan that will help mask any noises that might prevent you from going to sleep or awaken you during the night.   Sleep on a comfortable mattress and pillows.   Avoid bright light in the evening and excessive use of portable electronic devices right before bed that may contain light frequencies that can contribute to sleep problems.   Avoid alcohol, cigarettes, or heavy meals in the evening. If you must eat, consume a light snack 45 minutes before bed.   Use your bed only for sleep to strengthen the association between your bed and sleep.   If you can't go to sleep within 30 minutes, go into another room and do something relaxing until you feel tired. Then, come back and try to go to sleep again for 30 minutes and repeat until sleep is achieved.   Some people find over the counter melatonin to be helpful for sleep, which you could discuss with a pharmacist or prescribing provider.   I would like to reassure you that I do not see any signs of an early degenerative illness or other underlying neurologic cause for your symptoms. I think your symptoms will improve and or completely remit with treatment of the underlying causes listed above.

## 2019-05-22 NOTE — Addendum Note (Signed)
Addended by: Alphonzo Severance on: 05/22/2019 11:59 AM   Modules accepted: Orders

## 2019-05-22 NOTE — Progress Notes (Signed)
   [facility]  I met with Rachael Peters to review the findings resulting from her neuropsychological evaluation. Since the last appointment, things have been about the same. Her sister was also present and I was able to gather additional childhood history (her sister also believes that she likely has ADHD). Time was spent reviewing the impressions and recommendations that are detailed in the evaluation report. Interventions provided during this encounter included psychoeducation, care coordination, and other topics as reflected in the patient instructions. I took time to explain the findings and answer all the patient's questions. I encouraged Rachael Peters to contact me should she have any further questions or if further follow up is desired.   Current Medications and Medical History   Current Outpatient Medications  Medication Sig Dispense Refill  . budesonide-formoterol (SYMBICORT) 160-4.5 MCG/ACT inhaler Take 2 puffs first thing in am and then another 2 puffs about 12 hours later. (Patient taking differently: Two puffs once a day) 1 Inhaler 12  . diphenhydrAMINE (BENADRYL) 25 MG tablet Take 25 mg by mouth daily as needed for allergies.    Marland Kitchen escitalopram (LEXAPRO) 10 MG tablet Take 10 mg by mouth every evening.   12   No current facility-administered medications for this visit.   Patient Active Problem List   Diagnosis Date Noted  . GERD (gastroesophageal reflux disease) 04/23/2019  . Neoplasm of right kidney 04/20/2018  . COPD  GOLD II  02/25/2017  . Dyspnea on exertion 02/24/2017  . Bronchitis 01/31/2017  . Asthma exacerbation 01/30/2017  . Sepsis ([place]) 01/30/2017  . Influenza 01/30/2017  . Depression with anxiety 01/30/2017  . Acute respiratory failure with hypoxia ([place]) 01/30/2017  . Anxiety    Mental Status and Behavioral Observations  Rachael Peters was available at the prescheduled time for this telephonic appointment. She was alert and  generally oriented. Her self-reported mood was fine and her affect as assessed by vocal quality was neutral. Thought process was logical, linear, and goal-directed and she participated actively in the encounter.   Plan  Feedback provided regarding the patient's neuropsychological evaluation. We discussed diagnostic impressions and Rachael Peters accepted a referral for psychotherapy, which I will place today. There are several fruitful targets for behavioral intervention including her adjustment issues, ADHD symptoms and behavioral strategies for management, and also sleep problems. I encouraged her to try some of the sleep hygiene recommendations and strategies in the patient instructions in the meantime. Rachael Peters was encouraged to contact me if any questions arise or if further follow up is desired.   Viviano Simas Nicole Kindred, PsyD, ABN Clinical Neuropsychologist  Service(s) Provided at This Encounter: 50 minutes [phone]; Conjoint therapy with patient present)

## 2019-05-29 ENCOUNTER — Ambulatory Visit: Payer: BC Managed Care – PPO | Attending: Internal Medicine

## 2019-05-29 DIAGNOSIS — Z23 Encounter for immunization: Secondary | ICD-10-CM

## 2019-05-29 NOTE — Progress Notes (Signed)
   Covid-19 Vaccination Clinic  Name:  Rachael Peters    MRN: [id number] DOB: 03-23-1960  05/29/2019  Rachael Peters was observed post Covid-19 immunization for 15 minutes without incident. She was provided with Vaccine Information Sheet and instruction to access the V-Safe system.   Rachael Peters was instructed to call 911 with any severe reactions post vaccine: Marland Kitchen Difficulty breathing  . Swelling of face and throat  . A fast heartbeat  . A bad rash all over body  . Dizziness and weakness   Immunizations Administered    Name Date Dose VIS Date Route   Pfizer COVID-19 Vaccine 05/29/2019  4:21 PM 0.3 mL 01/26/2019 Intramuscular   Manufacturer: [place]   Lot: [id number]   [place]: [id number]

## 2019-11-13 NOTE — Progress Notes (Signed)
59 y.o. G66P0100 Married Caucasian female here for annual exam.    Reports she is continuing to gain weight.  Eating healthy and working out.  Quit smoking 2 years ago.  Not drinking ETOH.   Patient complaining of left hip/LLQ pain and lower back pain for 2 years.  Pain steady in LLQ for past 6 months. Has seen chiropractor for severe lower back pain making it painful for her to get out of bed.  The LLQ pain feels like ovulatory pain, and it comes and goes as a sharp pain that lasts for a short time.  No vaginal bleeding.  Does report she has lifelong constipation.  Takes occasional Miralax.   Hx UTIs.  Has been seen at [facility] for UTI symptoms.  She had urinary hesitancy and pressure in her lower abdomen and back.  She did have E Coli with one and then final UC negative with the other evaluation.  These were not related to intercourse.   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  Father passed away in Sep 06, 2022.   Has received her Covid vaccine.   PCP:   Claris Gower, MD  Patient's last menstrual period was 02/15/2013 (approximate).           Sexually active: Yes.    The current method of family planning is post menopausal status.    Exercising: Yes.    personal trainer and walks dog daily Smoker:  Former  Health Maintenance: Pap:  06-21-16 Neg:Neg HR HPV, 06-16-15 Neg:Neg HR HPV History of abnormal Pap:  Yes, Hx conization of cervix 1990. MMG: 09-04-18  3D/Neg/density C/biRads1--she knows to schedule Colonoscopy: 2014 normal with [facility], next due now--patient to call and schedule    BMD: 2016  Result :Normal TDaP:  PCP Gardasil:   no HIV: 01-30-17 NR Hep C: 06-21-16 Neg Screening Labs:   PCP   reports that she quit smoking about 20 months ago. Her smoking use included cigarettes. She started smoking about 2 years ago. She has a 5.00 pack-year smoking history. She has never used smokeless tobacco. She reports previous alcohol use. She reports that she does not use  drugs.  Past Medical History:  Diagnosis Date  . Abnormal Pap smear of cervix 1990   hx cervical conization for abnormal pap--paps normal since  . Anxiety   . Arthritis    DENIES   . Asthma   . Cat allergies   . COPD (chronic obstructive pulmonary disease) ([place])   . PONV (postoperative nausea and vomiting)     Past Surgical History:  Procedure Laterality Date  . APPENDECTOMY  1997  . CERVICAL CONIZATION W/BX  1990   AND CERVIX LESION DESTRUCTION    . ROBOTIC ASSITED PARTIAL NEPHRECTOMY Right 04/20/2018   Procedure: XI ROBOTIC ASSITED LAPAROSCOPIC PARTIAL NEPHRECTOMY;  Surgeon: Raynelle Bring, MD;  Location: [facility];  Service: Urology;  Laterality: Right;    Current Outpatient Medications  Medication Sig Dispense Refill  . albuterol (VENTOLIN HFA) 108 (90 Base) MCG/ACT inhaler albuterol sulfate HFA 90 mcg/actuation aerosol inhaler  1 2 INHALATIONS EVERY 4 6 HOURS AS NEEDED FOR SHORTNESS OF BREATH.    . budesonide-formoterol (SYMBICORT) 160-4.5 MCG/ACT inhaler Take 2 puffs first thing in am and then another 2 puffs about 12 hours later. (Patient taking differently: Two puffs once a day) 1 Inhaler 12  . diphenhydrAMINE (BENADRYL) 25 MG tablet Take 25 mg by mouth daily as needed for allergies.    Marland Kitchen escitalopram (LEXAPRO) 10 MG  tablet Take 10 mg by mouth every evening.   12  . gabapentin (NEURONTIN) 100 MG capsule Take 1 capsule by mouth as needed.    . traZODone (DESYREL) 150 MG tablet Take 150 mg by mouth at bedtime.     No current facility-administered medications for this visit.    Family History  Problem Relation Age of Onset  . Heart disease Maternal Grandmother   . Heart disease Maternal Grandfather   . Diabetes Maternal Grandfather   . Heart disease Paternal Grandmother   . Heart disease Paternal Grandfather   . Breast cancer Mother 45  . Breast cancer Sister 27  . Heart disease Father   . Stroke Father   . Heart attack Father     Review of Systems  All other  systems reviewed and are negative.   Exam:   BP 110/74   Pulse 68   Resp 14   Ht 5' 9.5" (1.765 m)   Wt 173 lb (78.5 kg)   LMP 02/15/2013 (Approximate)   BMI 25.18 kg/m     General appearance: alert, cooperative and appears stated age Head: normocephalic, without obvious abnormality, atraumatic Neck: no adenopathy, supple, symmetrical, trachea midline and thyroid normal to inspection and palpation Lungs: clear to auscultation bilaterally Breasts: normal appearance, no masses or tenderness, No nipple retraction or dimpling, No nipple discharge or bleeding, No axillary adenopathy Heart: regular rate and rhythm Abdomen: soft, non-tender; no masses, no organomegaly Extremities: extremities normal, atraumatic, no cyanosis or edema Skin: skin color, texture, turgor normal. No rashes or lesions Lymph nodes: cervical, supraclavicular, and axillary nodes normal. Neurologic: grossly normal  Pelvic: External genitalia:  no lesions              No abnormal inguinal nodes palpated.              Urethra:  normal appearing urethra with no masses, tenderness or lesions              Bartholins and Skenes: normal                 Vagina: normal appearing vagina with normal color and discharge, no lesions              Cervix: no lesions              Pap taken: No. Bimanual Exam:  Uterus:  normal size, contour, position, consistency, mobility, non-tender              Adnexa: no mass, fullness, tenderness              Rectal exam: Yes.  .  Confirms.              Anus:  normal sphincter tone, no lesions  Chaperone was present for exam.  Assessment:   Well woman visit with normal exam. FH of breast cancer in mother and sister. Sister is BRCA negative. High risk for breast cancer.  Unable to do breast MRI in past.  Remote hx of cervical conization.  FH CAD. Former smoker. Status post partial right nephrectomy for cystic angiomyolipoma.  Anxiety. On Lexapro. LLQ pain.  Hx UTIs. Weight  gain.   Plan: Mammogram screening discussed. Self breast awareness reviewed. Pap and HR HPV in 2023. Guidelines for Calcium, Vitamin D, regular exercise program including cardiovascular and weight bearing exercise. She will update her mammogram and then will consider vaginal estrogen cream for UTI prevention.   I discussed potential effect on breast cancer.  Flu vaccine mentioned. I ordered Shingrix for her.  Return for pelvic US.  She will schedule with her GI.  I reviewed with her organized weight loss programs throu [place].  Follow up annually and prn.   After visit summary provided.

## 2019-11-14 ENCOUNTER — Encounter: Payer: Self-pay | Admitting: Obstetrics and Gynecology

## 2019-11-14 ENCOUNTER — Other Ambulatory Visit: Payer: Self-pay

## 2019-11-14 ENCOUNTER — Ambulatory Visit: Payer: BC Managed Care – PPO | Admitting: Obstetrics and Gynecology

## 2019-11-14 VITALS — BP 110/74 | HR 68 | Resp 14 | Ht 69.5 in | Wt 173.0 lb

## 2019-11-14 DIAGNOSIS — R1032 Left lower quadrant pain: Secondary | ICD-10-CM | POA: Diagnosis not present

## 2019-11-14 DIAGNOSIS — Z01419 Encounter for gynecological examination (general) (routine) without abnormal findings: Secondary | ICD-10-CM | POA: Diagnosis not present

## 2019-11-14 MED ORDER — SHINGRIX 50 MCG/0.5ML IM SUSR: 0.5000 mL | Freq: Once | INTRAMUSCULAR | 0 refills | Status: AC

## 2019-11-15 ENCOUNTER — Other Ambulatory Visit: Payer: Self-pay | Admitting: Obstetrics and Gynecology

## 2019-11-15 ENCOUNTER — Telehealth: Payer: Self-pay

## 2019-11-15 DIAGNOSIS — Z1231 Encounter for screening mammogram for malignant neoplasm of breast: Secondary | ICD-10-CM

## 2019-11-15 NOTE — Telephone Encounter (Signed)
Spoke with patient regarding benefits for recommended ultrasound. Patient is aware that ultrasound is transvaginal. Patient acknowledges understanding of information presented. Patient is aware of cancellation policy. Patient scheduled appointment for 11/22/2019 at 1000AM with [facility]. Quincy Simmonds, MD, Cherlynn June. Encounter closed.

## 2019-11-22 ENCOUNTER — Ambulatory Visit: Payer: BC Managed Care – PPO | Admitting: Obstetrics and Gynecology

## 2019-11-22 ENCOUNTER — Other Ambulatory Visit: Payer: Self-pay

## 2019-11-22 ENCOUNTER — Ambulatory Visit (INDEPENDENT_AMBULATORY_CARE_PROVIDER_SITE_OTHER): Payer: BC Managed Care – PPO

## 2019-11-22 ENCOUNTER — Encounter: Payer: Self-pay | Admitting: Obstetrics and Gynecology

## 2019-11-22 VITALS — BP 112/80 | HR 64 | Ht 69.5 in | Wt 173.0 lb

## 2019-11-22 DIAGNOSIS — R1032 Left lower quadrant pain: Secondary | ICD-10-CM

## 2019-11-22 NOTE — Progress Notes (Signed)
GYNECOLOGY  VISIT   HPI: 59 y.o.   Married  Caucasian  female   G2P0100 with Patient's last menstrual period was 02/15/2013 (approximate).   here for pelvic ultrasound for LLQ pain for 6 - 9 months.   Had another pain that hurt enough that she could not get up off the sofa. It felt like a dull ache and then a short knife like pain and total pain lasts a couple of minutes.   States she has a sluggish colon.   She is due for a colonoscopy.   GYNECOLOGIC HISTORY: Patient's last menstrual period was 02/15/2013 (approximate). Contraception: PMP Menopausal hormone therapy:  none Last mammogram:  09-04-18  3D/Neg/density C/biRads1--she has appt. Last pap smear: 06-21-16 Neg:Neg HR HPV, 06-16-15 Neg:Neg HR HPV        OB History    Gravida  2   Para  1   Term      Preterm  1   AB      Living  0     SAB      TAB      Ectopic      Multiple      Live Births                 Patient Active Problem List   Diagnosis Date Noted  . GERD (gastroesophageal reflux disease) 04/23/2019  . Neoplasm of right kidney 04/20/2018  . COPD  GOLD II  02/25/2017  . Dyspnea on exertion 02/24/2017  . Bronchitis 01/31/2017  . Asthma exacerbation 01/30/2017  . Sepsis ([place]) 01/30/2017  . Influenza 01/30/2017  . Depression with anxiety 01/30/2017  . Acute respiratory failure with hypoxia ([place]) 01/30/2017  . Anxiety     Past Medical History:  Diagnosis Date  . Abnormal Pap smear of cervix 1990   hx cervical conization for abnormal pap--paps normal since  . Anxiety   . Arthritis    DENIES   . Asthma   . Cat allergies   . COPD (chronic obstructive pulmonary disease) ([place])   . PONV (postoperative nausea and vomiting)     Past Surgical History:  Procedure Laterality Date  . APPENDECTOMY  1997  . CERVICAL CONIZATION W/BX  1990   AND CERVIX LESION DESTRUCTION    . ROBOTIC ASSITED PARTIAL NEPHRECTOMY Right 04/20/2018   Procedure: XI ROBOTIC ASSITED LAPAROSCOPIC PARTIAL NEPHRECTOMY;   Surgeon: Raynelle Bring, MD;  Location: [facility];  Service: Urology;  Laterality: Right;    Current Outpatient Medications  Medication Sig Dispense Refill  . albuterol (VENTOLIN HFA) 108 (90 Base) MCG/ACT inhaler albuterol sulfate HFA 90 mcg/actuation aerosol inhaler  1 2 INHALATIONS EVERY 4 6 HOURS AS NEEDED FOR SHORTNESS OF BREATH.    . budesonide-formoterol (SYMBICORT) 160-4.5 MCG/ACT inhaler Take 2 puffs first thing in am and then another 2 puffs about 12 hours later. (Patient taking differently: Two puffs once a day) 1 Inhaler 12  . diphenhydrAMINE (BENADRYL) 25 MG tablet Take 25 mg by mouth daily as needed for allergies.    Marland Kitchen escitalopram (LEXAPRO) 10 MG tablet Take 10 mg by mouth every evening.   12  . gabapentin (NEURONTIN) 100 MG capsule Take 1 capsule by mouth as needed.    . traZODone (DESYREL) 150 MG tablet Take 150 mg by mouth at bedtime.     No current facility-administered medications for this visit.     ALLERGIES: Codeine  Family History  Problem Relation Age of Onset  . Heart disease Maternal Grandmother   .  Heart disease Maternal Grandfather   . Diabetes Maternal Grandfather   . Heart disease Paternal Grandmother   . Heart disease Paternal Grandfather   . Breast cancer Mother 47  . Breast cancer Sister 73  . Heart disease Father   . Stroke Father   . Heart attack Father     Social History   Socioeconomic History  . Marital status: Married    Spouse name: Herbie Baltimore  . Number of children: Not on file  . Years of education: Not on file  . Highest education level: Bachelor's degree (e.g., BA, AB, BS)  Occupational History  . Not on file  Tobacco Use  . Smoking status: Former Smoker    Packs/day: 0.50    Years: 10.00    Pack years: 5.00    Types: Cigarettes    Start date: 01/25/2017    Quit date: 02/15/2018    Years since quitting: 1.7  . Smokeless tobacco: Never Used  . Tobacco comment: LAST USES WAS EARLY DECEMBER 2019  Vaping Use  . Vaping Use: Never  used  Substance and Sexual Activity  . Alcohol use: Not Currently    Alcohol/week: 0.0 standard drinks    Comment: seldom  . Drug use: No  . Sexual activity: Yes    Partners: Male    Birth control/protection: Post-menopausal  Other Topics Concern  . Not on file  Social History Narrative  . Not on file   Social Determinants of Health   Financial Resource Strain:   . Difficulty of Paying Living Expenses: Not on file  Food Insecurity:   . Worried About Charity fundraiser in the Last Year: Not on file  . Ran Out of Food in the Last Year: Not on file  Transportation Needs:   . Lack of Transportation (Medical): Not on file  . Lack of Transportation (Non-Medical): Not on file  Physical Activity:   . Days of Exercise per Week: Not on file  . Minutes of Exercise per Session: Not on file  Stress:   . Feeling of Stress : Not on file  Social Connections:   . Frequency of Communication with Friends and Family: Not on file  . Frequency of Social Gatherings with Friends and Family: Not on file  . Attends Religious Services: Not on file  . Active Member of Clubs or Organizations: Not on file  . Attends Archivist Meetings: Not on file  . Marital Status: Not on file  Intimate Partner Violence:   . Fear of Current or Ex-Partner: Not on file  . Emotionally Abused: Not on file  . Physically Abused: Not on file  . Sexually Abused: Not on file    Review of Systems  All other systems reviewed and are negative.   PHYSICAL EXAMINATION:    BP 112/80   Pulse 64   Ht 5' 9.5" (1.765 m)   Wt 173 lb (78.5 kg)   LMP 02/15/2013 (Approximate)   BMI 25.18 kg/m     General appearance: alert, cooperative and appears stated age   Pelvic US Uterus with 1 cm intramural fibroid. Ovaries atrophic.  No adnexal masses.  No free fluid.   ASSESSMENT  Chronic LLQ pain.   Normal pelvic US.  Hx constipation.   PLAN  [place] report and images reviewed with patient.  She will contact Dr.  Watt Climes at [facility] for further evaluation.  She uses Miralax and increased fiber in her diet to treat constipation.  FU here prn.

## 2019-12-03 ENCOUNTER — Other Ambulatory Visit: Payer: Self-pay

## 2019-12-03 ENCOUNTER — Ambulatory Visit
Admission: RE | Admit: 2019-12-03 | Discharge: 2019-12-03 | Disposition: A | Discharge status: Home / Self Care | Payer: BC Managed Care – PPO | Source: Ambulatory Visit | Attending: Obstetrics and Gynecology | Admitting: Obstetrics and Gynecology

## 2019-12-03 DIAGNOSIS — Z1231 Encounter for screening mammogram for malignant neoplasm of breast: Secondary | ICD-10-CM

## 2020-02-07 ENCOUNTER — Other Ambulatory Visit: Payer: Self-pay | Admitting: Internal Medicine

## 2020-02-07 DIAGNOSIS — J449 Chronic obstructive pulmonary disease, unspecified: Secondary | ICD-10-CM

## 2020-02-25 ENCOUNTER — Encounter: Payer: Self-pay | Admitting: Obstetrics and Gynecology

## 2020-02-25 MED ORDER — NITROFURANTOIN MONOHYD MACRO 100 MG PO CAPS: 100.0000 mg | ORAL_CAPSULE | Freq: Two times a day (BID) | ORAL | 0 refills | Status: DC

## 2020-02-25 NOTE — Telephone Encounter (Addendum)
Dr.Silva I wasn't sure how you wanted to proceed with this message. I know you prefer office visit, but I wasn't sure due to covid exposure.  Patient said she just found out yesterday that the person she was around the weekend tested positive. Patient has not been tested for covid yet.

## 2020-07-09 ENCOUNTER — Other Ambulatory Visit (HOSPITAL_COMMUNITY): Payer: Self-pay

## 2020-07-09 ENCOUNTER — Telehealth: Payer: Self-pay | Admitting: Internal Medicine

## 2020-07-09 ENCOUNTER — Telehealth: Payer: Self-pay | Admitting: Pulmonary Disease

## 2020-07-09 DIAGNOSIS — U071 COVID-19: Secondary | ICD-10-CM

## 2020-07-09 MED ORDER — NIRMATRELVIR/RITONAVIR (PAXLOVID)TABLET
3.0000 | ORAL_TABLET | Freq: Two times a day (BID) | ORAL | 0 refills | Status: AC
  Filled 2020-07-09 – 2020-07-10 (×2): qty 30, 5d supply, fill #0

## 2020-07-09 NOTE — Telephone Encounter (Signed)
I have called the pt and LM on VM for her to call back to see when she can come in for labs.  She can either come here or go to [facility] ( this is where they will run the blood work needed for her )

## 2020-07-09 NOTE — Telephone Encounter (Signed)
Primary Pulmonologist: Wert Last office visit and with whom: 01/22/19  What do we see them for (pulmonary problems): COPD GOLD II    Last OV assessment/plan: Spirometry 02/24/2017  FEV1 0.93 (28%)  Ratio 81 poor tracing for f/v but curved on effort indep portion - 02/24/2017   try symb 160 2bid   - Allergy profile 02/24/17 >  Eos 0.3 /  IgE  206 RAST pos cat > dog - Alpha one AT screen  02/24/17  Level 144  MM  - PFT's  03/25/2017  FEV1 2.45  (75 % ) ratio 66  p 10  % improvement from saba p 10 prior to study with DLCO  75 % corrects to 76 % for alv volume  s symbicort am of visit - 03/25/2017  After extensive coaching inhaler device  effectiveness =    75% > continue symb 160 2bid - 01/22/2019  After extensive coaching inhaler device,  effectiveness =    80% (short ti)   She has a significant asthmatic component and has not been using symbicort consistently or in high enough doses to eliminate this component but if she does not improve to baseline on symb 160 either needs a lama added or Breztri depending on insurance  Pt informed of the seriousness of COVID 19 infection as a direct risk to their health  and safey and to those of their loved ones and should continue to wear facemask in public and minimize exposure to public locations but especially avoid any area or activity where non-close contacts are not observing distancing or wearing an appropriate face mask.   >>> f/u in 2 weeks if not improved, otherwise we can see her in 6 months when covid 19 restrictions may be resolved and can do full repeat pfts   I had an extended discussion with the patient reviewing all relevant studies completed to date and  lasting 25 minutes of a 40  minute extended office visit to re-establish with me p almost 2  years absence     re  severe non-specific but potentially very serious refractory respiratory symptoms of uncertain and potentially multiple  etiologies.  I performed device teaching  using a teach  back technique which also  extended face to face time for this visit (see above)   Each maintenance medication was reviewed in detail including most importantly the difference between maintenance and prns and under what circumstances the prns are to be triggered using an action plan format that is not reflected in the computer generated alphabetically organized AVS.    Please see AVS for specific instructions unique to this office visit that I personally wrote and verbalized to the the pt in detail and then reviewed with pt  by my nurse highlighting any changes in therapy/plan of care  recommended at today's visit.          Patient Instructions by Tanda Rockers, MD at 01/22/2019 9:30 AM  Author: Tanda Rockers, MD Author Type: Physician Filed: 01/24/2019 8:47 AM  Note Status: Addendum Cosign: Cosign Not Required Encounter Date: 01/22/2019  Editor: Tanda Rockers, MD (Physician)      Prior Versions: 1. Tanda Rockers, MD (Physician) at 01/22/2019 10:04 AM - Signed  Plan A = Automatic = Always=    symbicort 160 Take 2 puffs first thing in am and then another 2 puffs about 12 hours later.    Work on inhaler technique:  relax and gently blow all the way out then take  a nice smooth deep breath back in, triggering the inhaler at same time you start breathing in.  Hold for up to 5 seconds if you can. Blow out thru nose. Rinse and gargle with water when done      Plan B = Backup (to supplement plan A, not to replace it) Only use your albuterol inhaler as a rescue medication to be used if you can't catch your breath by resting or doing a relaxed purse lip breathing pattern.  - The less you use it, the better it will work when you need it. - Ok to use the inhaler up to 2 puffs  every 4 hours if you must but call for appointment if use goes up over your usual need - Don't leave home without it !!  (think of it like the spare tire for your car)      Please remember to go to the  x-ray  department  for your tests - we will call you with the results when they are available     If not all better in 2 weeks we need to see you, otherwise return in 6 months with PFTs          Was appointment offered to patient (explain)?  Pt has test positive for Covid x 3  Reason for call: Pt test positive for the last 3 days. Pt states fever/chills/fatigue/cough started on Sunday and she has since tested positive 3 times. Pt has gotten 2 pfizer vaccinations but no boosters. Pt says highest fever was 101.4 on Sunday and fever was 100.1 this morning. Pt's O2 saturation was 96% on RA. Dr. Halford Chessman please advise as Dr. Melvyn Novas is not currently in office.   (examples of things to ask: : When did symptoms start? Fever? Cough? Productive? Color to sputum? More sputum than usual? Wheezing? Have you needed increased oxygen? Are you taking your respiratory medications? What over the counter measures have you tried?)  Allergies  Allergen Reactions  . Codeine Nausea Only    Immunization History  Administered Date(s) Administered  . PFIZER(Purple Top)SARS-COV-2 Vaccination 05/04/2019, 05/29/2019

## 2020-07-09 NOTE — Telephone Encounter (Signed)
Spoke with pt and reviewed Dr. Juanetta Gosling recommendations with her. Scheduled her for televisit on Thursday 07/17/20 with Tammy Parrett. Pt stated understanding. Instructed pt that if symptoms become worse she needs to be evaluated in ER. Pt stated understanding. Nothing further needed at this time.

## 2020-07-09 NOTE — Telephone Encounter (Signed)
I have spoken with the pt and she will go to the [facility] lab in the morning for this blood work that is needed.  Will hold this message until her labs return.

## 2020-07-09 NOTE — Telephone Encounter (Signed)
Can send script for paxlovid.  Please schedule her for tele visit with Dr. Melvyn Novas or NP later this week or next week.

## 2020-07-09 NOTE — Telephone Encounter (Signed)
Received message from pharmacy.  Rachael Peters has not had recent BMET.  They will not dispense paxlovid without having recent GFR.  Please let her know that she needs to come in to get a BMET before she can get paxlovid.  Alternative if she doesn't want to get blood test done would be to send script for molnupavir.

## 2020-07-10 ENCOUNTER — Other Ambulatory Visit (HOSPITAL_COMMUNITY): Payer: Self-pay

## 2020-07-10 ENCOUNTER — Other Ambulatory Visit (INDEPENDENT_AMBULATORY_CARE_PROVIDER_SITE_OTHER): Payer: BC Managed Care – PPO

## 2020-07-10 DIAGNOSIS — U071 COVID-19: Secondary | ICD-10-CM | POA: Diagnosis not present

## 2020-07-10 LAB — BASIC METABOLIC PANEL
BUN: 10 mg/dL (ref 6–23)
CO2: 30 mEq/L (ref 19–32)
Calcium: 8.8 mg/dL (ref 8.4–10.5)
Chloride: 99 mEq/L (ref 96–112)
Creatinine, Ser: 0.88 mg/dL (ref 0.40–1.20)
GFR: 72.65 mL/min (ref >60.00)
Glucose, Bld: 78 mg/dL (ref 70–99)
Potassium: 3.3 mEq/L — ABNORMAL LOW (ref 3.5–5.1)
Sodium: 135 mEq/L (ref 135–145)

## 2020-07-10 NOTE — Telephone Encounter (Signed)
The patient called as the BMET was done and wanting to know if we had the results. The results were done and I printed results and faxed them to [facility] and I also called to make sure that is all needed. They were able to see the results and are filling it. I notified patient they are filling it now and told to call with any other issues. Patient verbalized understanding, nothing further needed.

## 2020-07-17 ENCOUNTER — Ambulatory Visit: Payer: BC Managed Care – PPO | Admitting: Adult Health

## 2020-10-15 ENCOUNTER — Other Ambulatory Visit: Payer: Self-pay

## 2020-10-15 ENCOUNTER — Encounter: Payer: Self-pay | Admitting: Neurology

## 2020-10-15 ENCOUNTER — Ambulatory Visit: Payer: BC Managed Care – PPO | Admitting: Neurology

## 2020-10-15 VITALS — BP 108/78 | HR 66 | Ht 70.0 in | Wt 165.0 lb

## 2020-10-15 DIAGNOSIS — R410 Disorientation, unspecified: Secondary | ICD-10-CM | POA: Diagnosis not present

## 2020-10-15 NOTE — Progress Notes (Signed)
Assessment/Plan:    1.  Memory change -Patient has been complaining about this at least since 2021. -Neurocognitive testing was unremarkable, with the exception of possible ADHD -We will do-mri brain ([facility]).  Patient quite claustrophobic so we will do this in an open unit. -If above is negative, patient will likely need to follow-up with primary care to see if any other reason for memory change.   Subjective:   Rachael Peters was seen today in follow up.  My previous records as well as any outside records available were reviewed prior to todays visit.  I have not seen her in about a year and a half.  At that point in time, she was complaining about tremor.  None was noted on physical exam.  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  Neurocognitive testing was essentially unremarkable, with the exception of feeling that the patient had pre-existing ADHD, in addition to poor sleep and adjustment issues associated with caregiving.  States sx's getting worse.  States that she is having trouble with reading documents.  She is having to cover one part of document (the part she read) to read next part.  She also c/o "waking up in a fog."  States that she feels foggy rest of day.  She isn't sure if related to memory or not.  No hx of neuroimaging.  States that she previously denied MRI because of claustrophobia but she has changed mind on that.    Notes some occasional L hand tremor and occasional head tremor.  Recently dx with viscous hemmorhage in R eye.  Noting balance worse since that time.    PREVIOUS MEDICATIONS: none to date  CURRENT MEDICATIONS:  Outpatient Encounter Medications as of 10/15/2020  Medication Sig   albuterol (VENTOLIN HFA) 108 (90 Base) MCG/ACT inhaler albuterol sulfate HFA 90 mcg/actuation aerosol inhaler  1 2 INHALATIONS EVERY 4 6 HOURS  AS NEEDED FOR SHORTNESS OF BREATH.   budesonide-formoterol (SYMBICORT) 160-4.5 MCG/ACT inhaler Two puffs once a day   escitalopram (LEXAPRO) 10 MG tablet Take 10 mg by mouth every evening.    [DISCONTINUED] diphenhydrAMINE (BENADRYL) 25 MG tablet Take 25 mg by mouth daily as needed for allergies. (Patient not taking: Reported on 10/15/2020)   [DISCONTINUED] gabapentin (NEURONTIN) 100 MG capsule Take 1 capsule by mouth as needed. (Patient not taking: Reported on 10/15/2020)   [DISCONTINUED] nitrofurantoin, macrocrystal-monohydrate, (MACROBID) 100 MG capsule Take 1 capsule (100 mg total) by mouth 2 (two) times daily. (Patient not taking: Reported on 10/15/2020)   [DISCONTINUED] traZODone (DESYREL) 150 MG tablet Take 150 mg by mouth at bedtime. (Patient not taking: Reported on 10/15/2020)   No facility-administered encounter medications on file as of 10/15/2020.     Objective:   PHYSICAL EXAMINATION:    VITALS:   Vitals:   10/15/20 1109  BP: 108/78  Pulse: 66  SpO2: 99%  Weight: 165 lb (74.8 kg)  Height: '5\' 10"'$  (1.778 m)    GEN:  The patient appears stated age and is in NAD. HEENT:  Normocephalic, atraumatic.  The mucous membranes are moist. The superficial temporal arteries are without ropiness or tenderness. CV:  RRR Lungs:  CTAB Neck/HEME:  There are no carotid bruits bilaterally.  Neurological examination:  Orientation: The patient is alert and oriented x3. Cranial nerves: There is good facial symmetry.The speech is fluent and clear. Soft palate rises symmetrically  and there is no tongue deviation. Hearing is intact to conversational tone. Sensation: Sensation is intact to light touch throughout Motor: Strength is 5/5 in the bilateral upper and lower extremities.  No pronator drift.  Movement examination: Tone: There is normal tone in the UE/LE Abnormal movements:  no tremor.  No myoclonus.  No asterixis.  No trouble with Archimedes spirals. Coordination:  There is no  decremation with RAM's. Gait and Station: The patient has no difficulty arising out of a deep-seated chair without the use of the hands. The patient's stride length is good.  Walks fairly well heel-to-toe in the hallway.    Total time spent on today's visit was 31 minutes, including both face-to-face time and nonface-to-face time.  Time included that spent on review of records (prior notes available to me/labs/imaging if pertinent), discussing treatment and goals, answering patient's questions and coordinating care.  Cc:  Leonard Downing, MD

## 2020-10-15 NOTE — Patient Instructions (Signed)
[facility] [address] [place]

## 2020-10-16 ENCOUNTER — Other Ambulatory Visit: Payer: Self-pay

## 2020-10-16 ENCOUNTER — Telehealth: Payer: Self-pay

## 2020-10-16 DIAGNOSIS — R413 Other amnesia: Secondary | ICD-10-CM

## 2020-10-16 DIAGNOSIS — R410 Disorientation, unspecified: Secondary | ICD-10-CM

## 2020-10-16 NOTE — Telephone Encounter (Signed)
I contacted patient an advised mri denial of MRI, will try an arrange CT at [facility], sent order,patient advised.

## 2020-10-16 NOTE — Telephone Encounter (Signed)
Pt is calling to let christy know her ins did not deny her MRI, they said it needed more clinical notes. It is pending as of today. Beth said this is the number they told her to call [phone]

## 2020-10-16 NOTE — Telephone Encounter (Signed)
Rachael Peters, I don't want her CT with contrast.  Please re-order without.  No indication for with contrast.

## 2020-10-16 NOTE — Telephone Encounter (Signed)
Patient is going to contact her insurance and will contact us back after she calls insurance.

## 2020-10-16 NOTE — Telephone Encounter (Signed)
CPT code [phone].  MRI brain wo contrast Denied. Reference  number [id number] you would like peer to peer, I can  call back  the number at  [phone]. I will be happy to arrange time, just let me know what you would like to do.Thanks.

## 2020-10-17 ENCOUNTER — Other Ambulatory Visit: Payer: Self-pay

## 2020-10-17 DIAGNOSIS — R410 Disorientation, unspecified: Secondary | ICD-10-CM

## 2020-10-17 DIAGNOSIS — R413 Other amnesia: Secondary | ICD-10-CM

## 2020-10-17 NOTE — Telephone Encounter (Signed)
Ordered ct without contrast, forwarding to [place].

## 2020-10-21 ENCOUNTER — Telehealth: Payer: Self-pay | Admitting: Neurology

## 2020-10-21 NOTE — Telephone Encounter (Signed)
Pt states that she got off the phone with [employer] and they have approved the MRI  and she needs to know if we got that approval and if we are moving forward with the MRI

## 2020-10-21 NOTE — Telephone Encounter (Signed)
Called patient regarding her phone message. She is saying her MRI is approved and is wanting to know should she cancel the CT scan scheduled and how should she move forward

## 2020-10-22 NOTE — Telephone Encounter (Signed)
Pt is returning a call to chelsea 

## 2020-10-22 NOTE — Telephone Encounter (Signed)
Called patient let he know we have sent her referral for MRI to [facility] and instructed her to call and cancel the Ct scan she had with [facility]

## 2020-10-24 NOTE — Telephone Encounter (Signed)
Close encounter 

## 2020-10-28 ENCOUNTER — Telehealth: Payer: Self-pay | Admitting: Neurology

## 2020-10-28 NOTE — Telephone Encounter (Signed)
Patient scheduled at [facility] for an open MRI at 6:30 PM tomorrow.   She'd like a prescription to help with her "extreme anxiety" during the test.  [facility] [address] [facility] [address] [phone]

## 2020-10-29 MED ORDER — DIAZEPAM 5 MG PO TABS: ORAL_TABLET | ORAL | 0 refills | Status: DC

## 2020-10-29 NOTE — Telephone Encounter (Signed)
Sent.  Needs driver

## 2020-10-31 ENCOUNTER — Telehealth: Payer: Self-pay

## 2020-10-31 NOTE — Telephone Encounter (Signed)
-----   Message from Cameron Sprang, MD sent at 10/31/2020  8:50 AM EDT ----- Regarding: MRI results Pls let her know that the brain MRI did not show any tumor, stroke, or bleed. No changes in the brain to cause memory loss. There is an incidental finding of a small cyst that is not affecting her brain. Incidental finding means we did the test for something else and found this, but it would not cause any of her symptoms. Per Dr. Carles Collet, f/u with PCP if MRI is fine, thanks   ----- Message ----- From: Leeroy Bock Sent: 10/30/2020   1:38 PM EDT To: Eustace Quail Tat, DO

## 2020-10-31 NOTE — Telephone Encounter (Signed)
Called patient she understood findings and will follow up with PCP and possible ENT in the future no other questions at this time

## 2020-11-02 ENCOUNTER — Telehealth: Payer: Self-pay | Admitting: Neurology

## 2020-11-02 NOTE — Telephone Encounter (Signed)
Let pt know that I got report from MRI.  Was essentially unremarkable.  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  Mild white matter changes, which in her is likely due to past cigarette use.  Can come from other things, like migraine, HTN, Hyperlipidemia, etc but she doesn't have those.  This is not cause of her sx's.  As I mentioned to her, with normal neurocog testing and normal MRI, her complaints of memory change are likely not a primarily neuro problem.  She can f/u with PCP to see next steps.

## 2020-11-03 ENCOUNTER — Telehealth: Payer: Self-pay

## 2020-11-03 NOTE — Telephone Encounter (Signed)
I called patient back with results from Dr. Delice Lesch on Friday. Patient agreed to follow up with PCP she mentioned also going to ENT

## 2020-11-06 ENCOUNTER — Other Ambulatory Visit: Payer: BC Managed Care – PPO

## 2020-12-08 ENCOUNTER — Ambulatory Visit: Payer: BC Managed Care – PPO | Admitting: Neurology

## 2020-12-11 ENCOUNTER — Other Ambulatory Visit: Payer: Self-pay | Admitting: Obstetrics and Gynecology

## 2020-12-11 DIAGNOSIS — Z1231 Encounter for screening mammogram for malignant neoplasm of breast: Secondary | ICD-10-CM

## 2020-12-24 ENCOUNTER — Encounter: Payer: Self-pay | Admitting: Obstetrics and Gynecology

## 2020-12-24 ENCOUNTER — Other Ambulatory Visit: Payer: Self-pay

## 2020-12-24 ENCOUNTER — Ambulatory Visit (INDEPENDENT_AMBULATORY_CARE_PROVIDER_SITE_OTHER): Payer: BC Managed Care – PPO | Admitting: Obstetrics and Gynecology

## 2020-12-24 VITALS — BP 110/78 | HR 75 | Ht 69.0 in | Wt 166.0 lb

## 2020-12-24 DIAGNOSIS — Z01419 Encounter for gynecological examination (general) (routine) without abnormal findings: Secondary | ICD-10-CM | POA: Diagnosis not present

## 2020-12-24 NOTE — Patient Instructions (Signed)

## 2020-12-24 NOTE — Progress Notes (Signed)
60 y.o. G30P0100 Married Caucasian female here for annual exam.    Struggling with her weight.  Stopped exercising for a period of time due to a retinal tear.  Will see a nutritionist.   Bladder is much better since drinking more water and less sodas.   Takes vit B12 sublingual.   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  Stressful situation.    PCP: Pricilla Holm, MD  Patient's last menstrual period was 02/15/2013 (approximate).           Sexually active: Yes.    The current method of family planning is post menopausal status.    Exercising: Yes.     Strength and cardio Smoker:  Former  Health Maintenance: Pap:  06-21-16 Neg:Neg HR HPV, 06-16-15 Neg:Neg HR HPV History of abnormal Pap:  yes, Hx conization of cervix 1990. MMG: 12-03-19 3D/Neg/Birads1  Appt.01-14-21 Colonoscopy: 2022;next 10 years BMD:  2016  Result :Normal TDaP:  PCP Gardasil:   no HIV:01-30-17 NR Hep C: 01-30-17 Neg Screening Labs: PCP. Flu vaccine:  declined.  Covid vaccines:  completed original series.  Declines booster.    reports that she quit smoking about 2 years ago. Her smoking use included cigarettes. She started smoking about 3 years ago. She has a 5.00 pack-year smoking history. She has never used smokeless tobacco. She reports that she does not currently use alcohol. She reports that she does not use drugs.  Past Medical History:  Diagnosis Date   Abnormal Pap smear of cervix 1990   hx cervical conization for abnormal pap--paps normal since   Anxiety    Arthritis    DENIES    Asthma    Cat allergies    COPD (chronic obstructive pulmonary disease) (HCC)    PONV (postoperative nausea and vomiting)     Past Surgical History:  Procedure Laterality Date   APPENDECTOMY  1997   CERVICAL CONIZATION W/BX  1990   AND CERVIX LESION DESTRUCTION     RETINAL TEAR REPAIR CRYOTHERAPY Right    ROBOTIC ASSITED PARTIAL NEPHRECTOMY Right 04/20/2018   Procedure: XI ROBOTIC ASSITED  LAPAROSCOPIC PARTIAL NEPHRECTOMY;  Surgeon: Raynelle Bring, MD;  Location: [facility];  Service: Urology;  Laterality: Right;    Current Outpatient Medications  Medication Sig Dispense Refill   albuterol (VENTOLIN HFA) 108 (90 Base) MCG/ACT inhaler albuterol sulfate HFA 90 mcg/actuation aerosol inhaler  1 2 INHALATIONS EVERY 4 6 HOURS AS NEEDED FOR SHORTNESS OF BREATH.     budesonide-formoterol (SYMBICORT) 80-4.5 MCG/ACT inhaler TAKE 2 PUFFS BY MOUTH TWICE A DAY     escitalopram (LEXAPRO) 10 MG tablet 1 tablet     No current facility-administered medications for this visit.    Family History  Problem Relation Age of Onset   Heart disease Maternal Grandmother    Heart disease Maternal Grandfather    Diabetes Maternal Grandfather    Heart disease Paternal Grandmother    Heart disease Paternal Grandfather    Breast cancer Mother 46   Breast cancer Sister 1   Heart disease Father    Stroke Father    Heart attack Father     Review of Systems  All other systems reviewed and are negative.  Exam:   BP 110/78   Pulse 75   Ht 5' 9"  (1.753 m)   Wt 166 lb (75.3 kg)   LMP 02/15/2013 (Approximate)   SpO2 98%   BMI 24.51 kg/m     General appearance: alert, cooperative and appears stated  age Head: normocephalic, without obvious abnormality, atraumatic Neck: no adenopathy, supple, symmetrical, trachea midline and thyroid normal to inspection and palpation Lungs: clear to auscultation bilaterally Breasts: normal appearance, no masses or tenderness, No nipple retraction or dimpling, No nipple discharge or bleeding, No axillary adenopathy Heart: regular rate and rhythm Abdomen: soft, non-tender; no masses, no organomegaly Extremities: extremities normal, atraumatic, no cyanosis or edema Skin: skin color, texture, turgor normal. No rashes or lesions Lymph nodes: cervical, supraclavicular, and axillary nodes normal. Neurologic: grossly normal  Pelvic: External genitalia:  no lesions               No abnormal inguinal nodes palpated.              Urethra:  normal appearing urethra with no masses, tenderness or lesions              Bartholins and Skenes: normal                 Vagina: normal appearing vagina with normal color and discharge, no lesions              Cervix: no lesions              Pap taken: no Bimanual Exam:  Uterus:  normal size, contour, position, consistency, mobility, non-tender              Adnexa: no mass, fullness, tenderness              Rectal exam: yes.  Confirms.              Anus:  normal sphincter tone, no lesions  Chaperone was present for exam:  Estill Bamberg, CMA  Assessment:   Well woman visit with gynecologic exam. FH of breast cancer in mother and sister.   Sister is BRCA negative.  High risk for breast cancer.  Unable to do breast MRI in past.  Remote hx of cervical conization.  FH CAD.  Former smoker.  Status post partial right nephrectomy for cystic angiomyolipoma.  Anxiety. On Lexapro. Hx UTIs.  Now under control.    Plan: Mammogram screening discussed. Self breast awareness reviewed. Pap and HR HPV next year.  Guidelines for Calcium, Vitamin D, regular exercise program including cardiovascular and weight bearing exercise. Labs with PCP. BMD age 58 yo.  Follow up annually and prn.   After visit summary provided.

## 2021-01-14 ENCOUNTER — Ambulatory Visit
Admission: RE | Admit: 2021-01-14 | Discharge: 2021-01-14 | Disposition: A | Discharge status: Home / Self Care | Payer: BC Managed Care – PPO | Source: Ambulatory Visit | Attending: Obstetrics and Gynecology | Admitting: Obstetrics and Gynecology

## 2021-01-14 DIAGNOSIS — Z1231 Encounter for screening mammogram for malignant neoplasm of breast: Secondary | ICD-10-CM

## 2021-03-09 ENCOUNTER — Other Ambulatory Visit: Payer: Self-pay | Admitting: Registered Nurse

## 2021-03-09 DIAGNOSIS — Z8249 Family history of ischemic heart disease and other diseases of the circulatory system: Secondary | ICD-10-CM

## 2021-03-20 ENCOUNTER — Ambulatory Visit: Payer: BC Managed Care – PPO | Admitting: Internal Medicine

## 2021-04-03 ENCOUNTER — Ambulatory Visit
Admission: RE | Admit: 2021-04-03 | Discharge: 2021-04-03 | Disposition: A | Discharge status: Home / Self Care | Payer: No Typology Code available for payment source | Source: Ambulatory Visit | Attending: Registered Nurse | Admitting: Registered Nurse

## 2021-04-03 ENCOUNTER — Other Ambulatory Visit: Payer: Self-pay

## 2021-04-03 DIAGNOSIS — Z8249 Family history of ischemic heart disease and other diseases of the circulatory system: Secondary | ICD-10-CM

## 2021-07-24 ENCOUNTER — Ambulatory Visit: Payer: BC Managed Care – PPO | Admitting: Internal Medicine

## 2021-10-06 IMAGING — MR MR HIP*L* W/O CM
4 of 5 series · 30 of 40 positions shown · non-contrast
Comparison: Limited correlation made with pelvic CT 10/04/2016.

CLINICAL DATA: Severe left hip pain for 1 year with weakness. No
known injury.

EXAM:
MR OF THE LEFT HIP WITHOUT CONTRAST
TECHNIQUE: Multiplanar, multisequence MR imaging was performed. No intravenous
contrast was administered.

[Series 4: T1 · coronal · 4.0mm · 0.74mm/px · 6 of 20 slices shown]
[im 1/20]
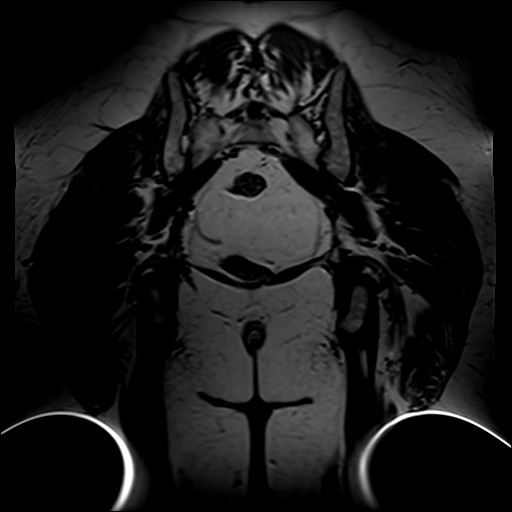
[im 4/20]
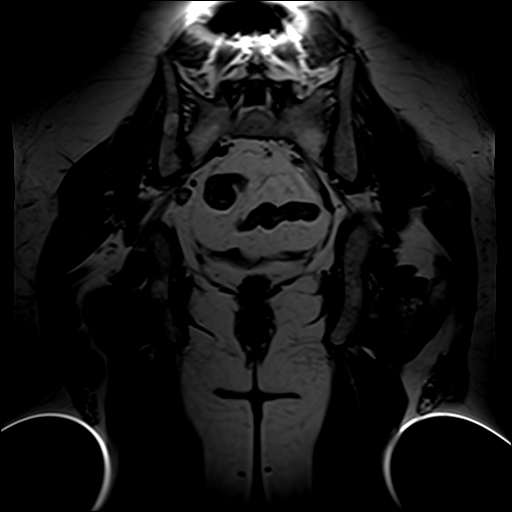
[im 8/20]
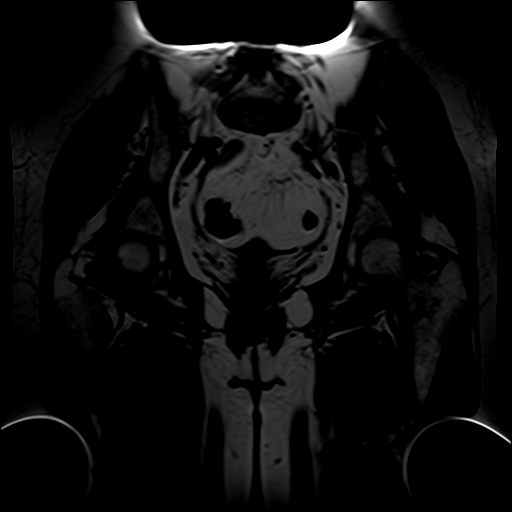
[im 12/20]
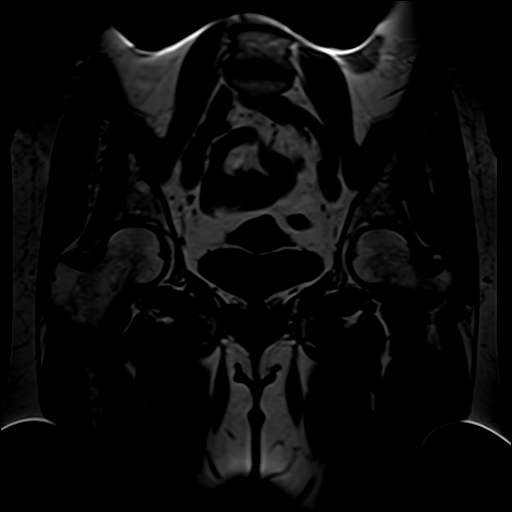
[im 16/20]
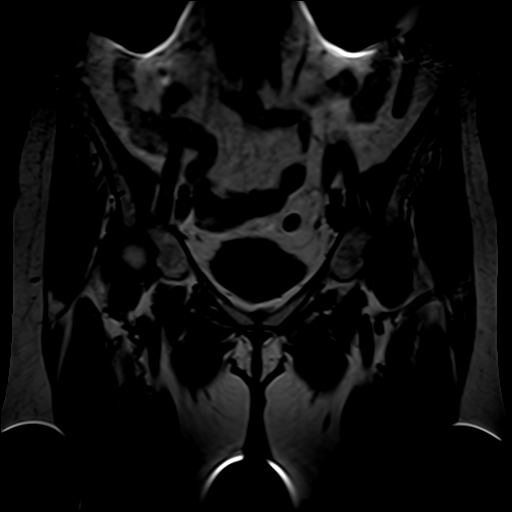
[im 20/20]
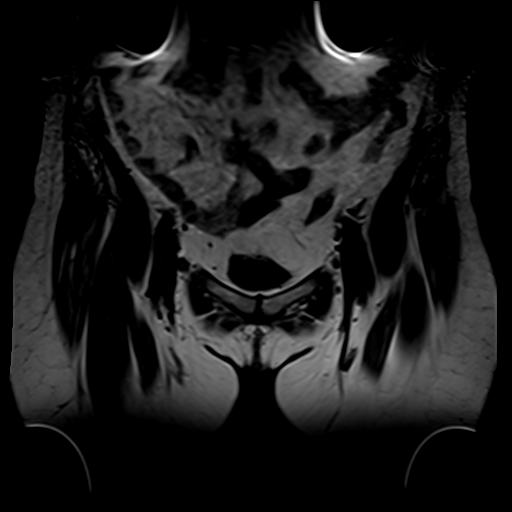

[Series 5: T2 fat-sat · coronal · 4.0mm · 0.74mm/px · 8 of 24 slices shown]
[im 1/24]
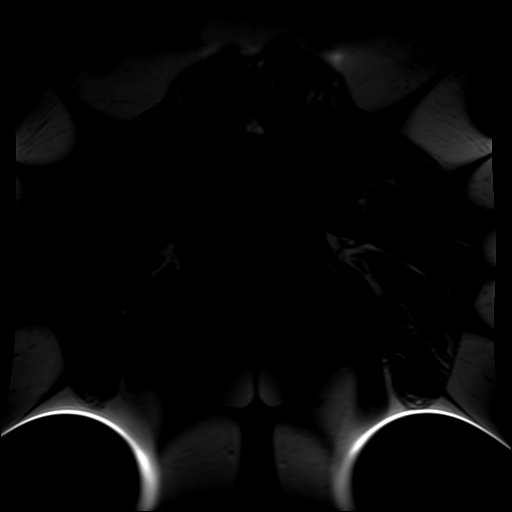
[im 4/24]
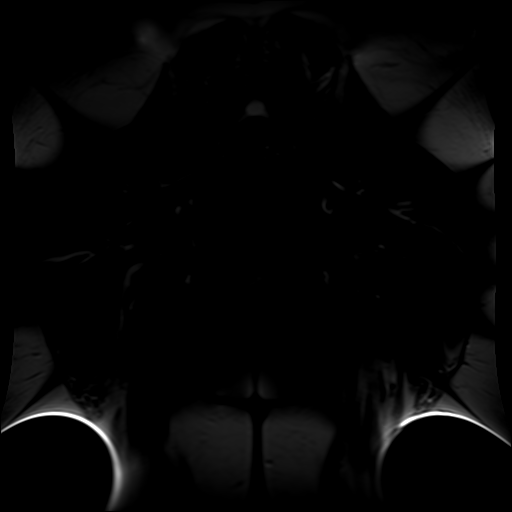
[im 7/24]
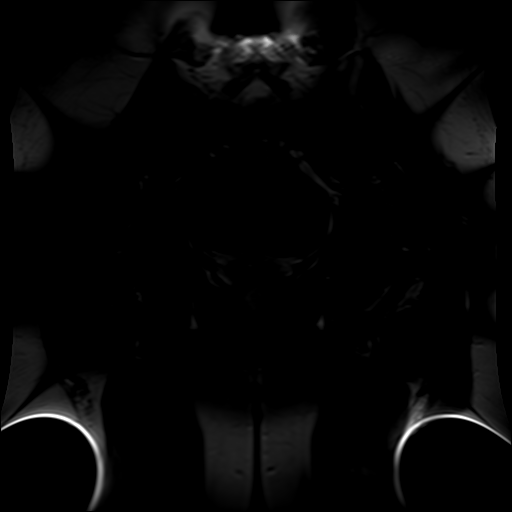
[im 10/24]
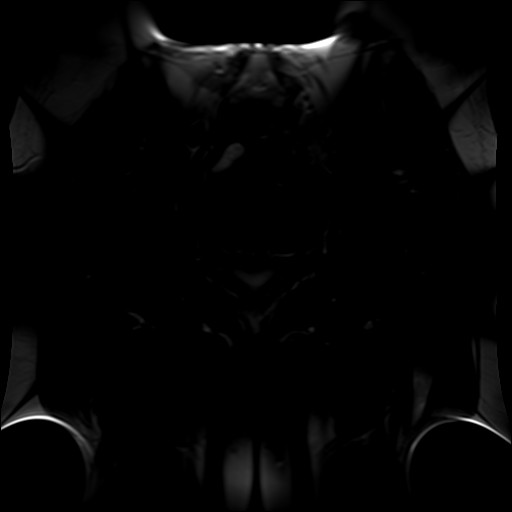
[im 14/24]
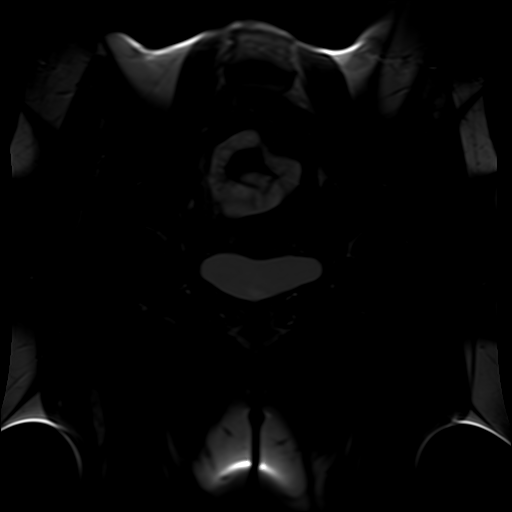
[im 17/24]
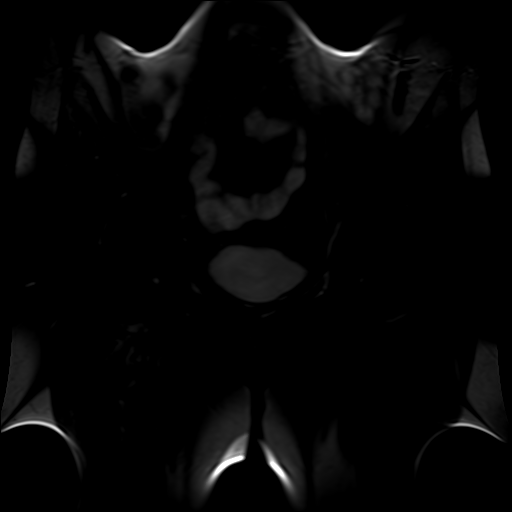
[im 20/24]
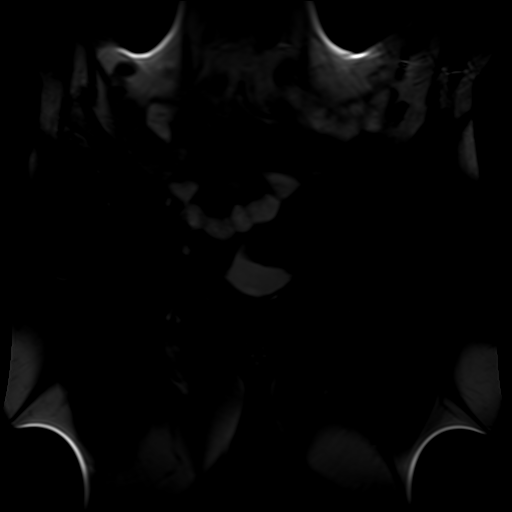
[im 24/24]
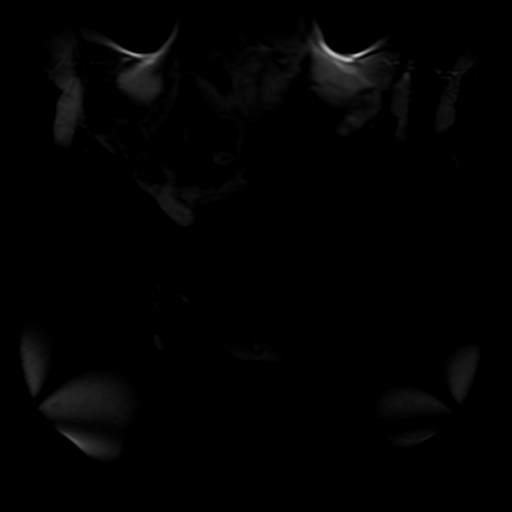

[Series 6: PD fat-sat · sagittal · 4.0mm · 0.70mm/px · 9 of 25 slices shown (1 of 2)]
[im 1/25]
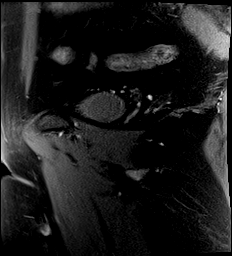
[im 4/25]
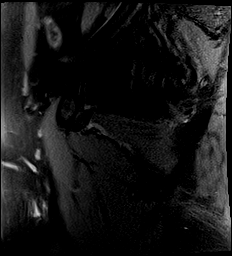
[im 7/25]
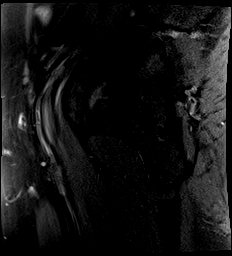
[im 10/25]
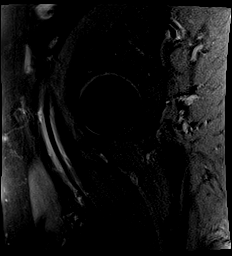
[im 13/25]
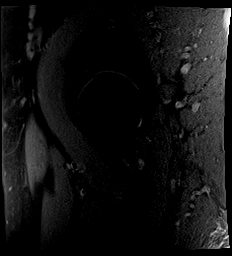
[im 16/25]
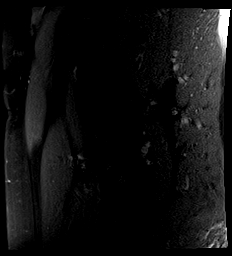
[im 19/25]
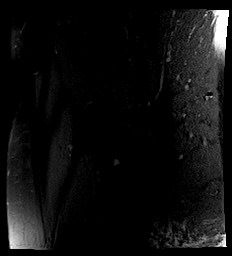
[im 22/25]
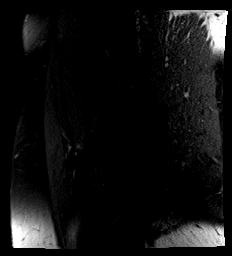
[im 25/25]
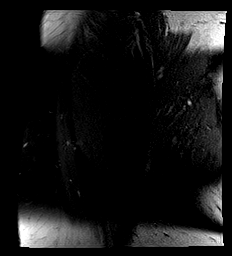

[Series 7: PD fat-sat · coronal · 4.0mm · 0.70mm/px · 7 of 19 slices shown (2 of 2)]
[im 1/19]
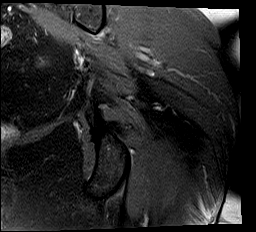
[im 4/19]
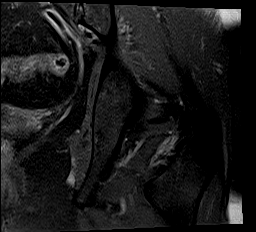
[im 7/19]
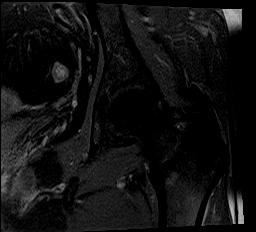
[im 10/19]
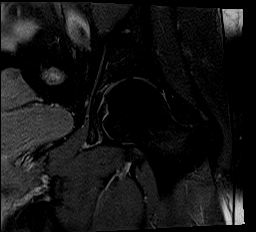
[im 13/19]
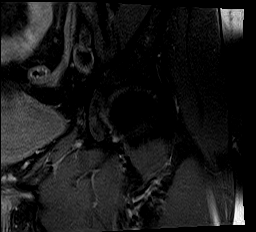
[im 16/19]
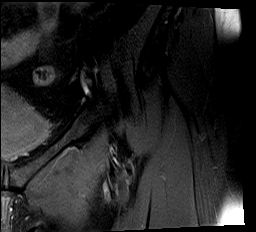
[im 19/19]
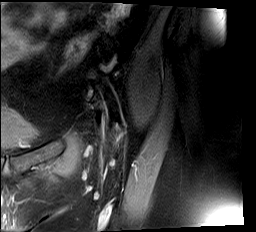

[30 of 40 positions shown; findings below may reference images not displayed]

FINDINGS: Bones: There is no evidence of acute fracture, dislocation or
avascular necrosis. The visualized bony pelvis appears normal. The
visualized sacroiliac joints and symphysis pubis appear normal.
There are stable small bone islands in the left femoral head and
right iliac bone. There is a small synovial herniation pit
anteriorly in the right femoral neck. Endplate degenerative changes
are noted at the lumbosacral junction.

Articular cartilage and labrum

Articular cartilage: No focal chondral defect or subchondral signal
abnormality identified.

Labrum: There is no gross labral tear or paralabral abnormality.

Joint or bursal effusion

Joint effusion: No significant hip joint effusion.

Bursae: No focal periarticular fluid collection.

Muscles and tendons

Muscles and tendons: The visualized gluteus, hamstring and iliopsoas
tendons appear normal. The piriformis muscles appear symmetric.

Other findings

Miscellaneous: The visualized internal pelvic contents appear
unremarkable.
IMPRESSION: 1. No acute findings or explanation for the patient's symptoms.
2. No significant left hip arthropathy identified.
3. Degenerative disc disease at L5-S1, similar to previous CT.

## 2021-12-04 ENCOUNTER — Other Ambulatory Visit: Payer: Self-pay | Admitting: Obstetrics and Gynecology

## 2021-12-04 DIAGNOSIS — Z1231 Encounter for screening mammogram for malignant neoplasm of breast: Secondary | ICD-10-CM

## 2021-12-31 ENCOUNTER — Ambulatory Visit (INDEPENDENT_AMBULATORY_CARE_PROVIDER_SITE_OTHER): Payer: BC Managed Care – PPO | Admitting: Obstetrics and Gynecology

## 2021-12-31 ENCOUNTER — Encounter: Payer: Self-pay | Admitting: Obstetrics and Gynecology

## 2021-12-31 ENCOUNTER — Other Ambulatory Visit (HOSPITAL_COMMUNITY)
Admission: RE | Admit: 2021-12-31 | Discharge: 2021-12-31 | Disposition: A | Discharge status: Home / Self Care | Payer: BC Managed Care – PPO | Source: Ambulatory Visit | Attending: Obstetrics and Gynecology | Admitting: Obstetrics and Gynecology

## 2021-12-31 VITALS — BP 124/78 | HR 80 | Ht 69.0 in | Wt 167.0 lb

## 2021-12-31 DIAGNOSIS — Z01419 Encounter for gynecological examination (general) (routine) without abnormal findings: Secondary | ICD-10-CM | POA: Diagnosis not present

## 2021-12-31 DIAGNOSIS — R1900 Intra-abdominal and pelvic swelling, mass and lump, unspecified site: Secondary | ICD-10-CM

## 2021-12-31 DIAGNOSIS — Z803 Family history of malignant neoplasm of breast: Secondary | ICD-10-CM

## 2021-12-31 DIAGNOSIS — Z124 Encounter for screening for malignant neoplasm of cervix: Secondary | ICD-10-CM

## 2021-12-31 DIAGNOSIS — R102 Pelvic and perineal pain: Secondary | ICD-10-CM

## 2021-12-31 NOTE — Patient Instructions (Signed)

## 2021-12-31 NOTE — Progress Notes (Signed)
61 y.o. G68P0100 Married Caucasian female here for annual exam.    Struggling with her weight.  Her goal is 150 - 155 pounds.   Having hot flashes.   Having lower abdominal tenderness and abdominal fullness for several months.  It is random and not linked to food.  Nothing makes it better or worse.  Does not prevention her from doing activities or sleeping.  Has taken Tylenol for the discomfort.   BMs are daily with her taking magnesium.  No straining.  Hx IBS.   No vaginal bleeding.   No dysuria.   She will see cardiology for cardiac prevention care.  Caregiving for her mother and brother this year.   PCP:   Thedora Hinders  Patient's last menstrual period was 02/15/2013 (approximate).           Sexually active: Yes.    The current method of family planning is post menopausal status.    Exercising: Yes.    Strength and cardio  Smoker:  no  Health Maintenance: Pap:  06-21-16 Neg:Neg HR HPV, 06-16-15 Neg:Neg HR HPV  History of abnormal Pap:  yes, Hx conization of cervix 1990.  MMG:  01/14/21 - BI-RADS CATEGORY  1: Negative.  Has appt in December.  Colonoscopy:  2022;next 10 years  BMD:     2016  Result :Normal  TDaP:  pcp Gardasil:   no HIV:  2018 neg Hep C:  2018 neg Screening Labs:  PCP   reports that she quit smoking about 3 years ago. Her smoking use included cigarettes. She started smoking about 4 years ago. She has a 5.00 pack-year smoking history. She has never used smokeless tobacco. She reports that she does not currently use alcohol. She reports that she does not use drugs.  Past Medical History:  Diagnosis Date   Abnormal Pap smear of cervix 1990   hx cervical conization for abnormal pap--paps normal since   Anxiety    Arthritis    DENIES    Asthma    Cat allergies    COPD (chronic obstructive pulmonary disease) (HCC)    PONV (postoperative nausea and vomiting)     Past Surgical History:  Procedure Laterality Date   APPENDECTOMY  1997   CERVICAL  CONIZATION W/BX  1990   AND CERVIX LESION DESTRUCTION     RETINAL TEAR REPAIR CRYOTHERAPY Right    ROBOTIC ASSITED PARTIAL NEPHRECTOMY Right 04/20/2018   Procedure: XI ROBOTIC ASSITED LAPAROSCOPIC PARTIAL NEPHRECTOMY;  Surgeon: Raynelle Bring, MD;  Location: [facility];  Service: Urology;  Laterality: Right;    Current Outpatient Medications  Medication Sig Dispense Refill   albuterol (VENTOLIN HFA) 108 (90 Base) MCG/ACT inhaler albuterol sulfate HFA 90 mcg/actuation aerosol inhaler  1 2 INHALATIONS EVERY 4 6 HOURS AS NEEDED FOR SHORTNESS OF BREATH.     budesonide-formoterol (SYMBICORT) 80-4.5 MCG/ACT inhaler TAKE 2 PUFFS BY MOUTH TWICE A DAY     escitalopram (LEXAPRO) 10 MG tablet 1 tablet     No current facility-administered medications for this visit.    Family History  Problem Relation Age of Onset   Heart disease Maternal Grandmother    Heart disease Maternal Grandfather    Diabetes Maternal Grandfather    Heart disease Paternal Grandmother    Heart disease Paternal Grandfather    Breast cancer Mother 59   Breast cancer Sister 45   Heart disease Father    Stroke Father    Heart attack Father     Review of Systems  All other systems reviewed and are negative.   Exam:   BP 124/78 (BP Location: Right Arm, Patient Position: Sitting, Cuff Size: Normal)   Pulse 80   Ht _0  (1.753 m)   Wt 167 lb (75.8 kg)   LMP 02/15/2013 (Approximate)   BMI 24.66 kg/m     General appearance: alert, cooperative and appears stated age Head: normocephalic, without obvious abnormality, atraumatic Neck: no adenopathy, supple, symmetrical, trachea midline and thyroid normal to inspection and palpation Lungs: clear to auscultation bilaterally Breasts: normal appearance, no masses or tenderness, No nipple retraction or dimpling, No nipple discharge or bleeding, No axillary adenopathy Heart: regular rate and rhythm Abdomen: soft, non-tender; no masses, no organomegaly Extremities: extremities  normal, atraumatic, no cyanosis or edema Skin: skin color, texture, turgor normal. No rashes or lesions Lymph nodes: cervical, supraclavicular, and axillary nodes normal. Neurologic: grossly normal  Pelvic: External genitalia:  no lesions              No abnormal inguinal nodes palpated.              Urethra:  normal appearing urethra with no masses, tenderness or lesions              Bartholins and Skenes: normal                 Vagina: normal appearing vagina with normal color and discharge, no lesions              Cervix: no lesions              Pap taken: yes Bimanual Exam:  Uterus:  normal size, contour, position, consistency, mobility, non-tender              Adnexa: no mass, fullness, tenderness on left.  Left adnexal tenderness with no mass on right.               Rectal exam: yes.  Confirms.              Anus:  normal sphincter tone, no lesions  Chaperone was present for exam:  Kimalexis  Assessment:   Well woman visit with gynecologic exam. FH of breast cancer in mother and sister.   Sister is BRCA negative.  High risk for breast cancer.  Unable to do breast MRI in past.  Remote hx of cervical conization.  FH CAD.  Former smoker.  Status post partial right nephrectomy for cystic angiomyolipoma.  Anxiety. On Lexapro. Hx UTIs.  Now under control.   Pelvic pain.  Abdominal swelling.  Hx IBS.  Weight gain.   Plan: Mammogram screening discussed. Self breast awareness reviewed. Pap and HR HPV as above. Guidelines for Calcium, Vitamin D, regular exercise program including cardiovascular and weight bearing exercise. Urinalysis and reflex culture.  Pelvic US. Referral to medical oncology.  Benefits discussed including possible treatment with medication for reduction of risk of breast cancer.  Follow up annually and prn.   20 min  total time was spent for this patient encounter, including preparation, face-to-face counseling with the patient, coordination of care, and  documentation of the encounter in addition to routine annual exam.  Evaluated for pelvic pain, abdominal swelling and referral to medical oncology.    After visit summary provided.

## 2022-01-01 LAB — CYTOLOGY - PAP
Comment: NEGATIVE
Diagnosis: NEGATIVE
High risk HPV: NEGATIVE

## 2022-01-04 ENCOUNTER — Other Ambulatory Visit: Payer: Self-pay | Admitting: Obstetrics and Gynecology

## 2022-01-04 LAB — URINALYSIS, COMPLETE W/RFL CULTURE
Bilirubin Urine: NEGATIVE
Glucose, UA: NEGATIVE
Hyaline Cast: NONE SEEN /LPF
Ketones, ur: NEGATIVE
Leukocyte Esterase: NEGATIVE
Nitrites, Initial: NEGATIVE
Protein, ur: NEGATIVE
Specific Gravity, Urine: 1.02 (ref 1.001–1.035)
pH: 6.5 (ref 5.0–8.0)

## 2022-01-04 LAB — URINE CULTURE
MICRO NUMBER:: 14198756
SPECIMEN QUALITY:: ADEQUATE

## 2022-01-04 LAB — CULTURE INDICATED

## 2022-01-04 MED ORDER — NITROFURANTOIN MONOHYD MACRO 100 MG PO CAPS: 100.0000 mg | ORAL_CAPSULE | Freq: Two times a day (BID) | ORAL | 0 refills | Status: DC

## 2022-01-05 ENCOUNTER — Telehealth: Payer: Self-pay

## 2022-01-05 NOTE — Telephone Encounter (Signed)
FYI. Pt had an additional question when I called to confirm that she had received your mychart re: her ucx results. Pt did confirm she received and will pick up abx today.   Pt asked about f/u appt for an U/S for her pelvic pain. She asked if it could possibly be coming from UTI, if she still needed to keep appt for [place].   I advised pt to keep it for now and to f/u with [place] on Monday after the holiday and let us know how she is doing after completing antibiotic. And we will confer with Dr. Quincy Simmonds at that time if she still feels that [place] is needed.  Pt agreed and voiced understanding.

## 2022-01-05 NOTE — Telephone Encounter (Signed)
If pelvic pain and abdominal bloating persist after treating UTI with abx, then I recommend proceeding with the pelvic US.

## 2022-01-05 NOTE — Telephone Encounter (Signed)
Will wait for pt to contact us back after the holiday with update.

## 2022-01-11 NOTE — Progress Notes (Unsigned)
GYNECOLOGY  VISIT   HPI: 61 y.o.   Married  Caucasian  female   G2P0100 with Patient's last menstrual period was 02/15/2013 (approximate).   here for   [place] consult (no full bladder)  GYNECOLOGIC HISTORY: Patient's last menstrual period was 02/15/2013 (approximate). Contraception:  post menopausal Menopausal hormone therapy:  n/a Last mammogram:  01/14/21, Breast Density Category C, BI-RADS CATEGORY 1: Negative Last pap smear:   11//16/23 negative: HR HPV negative,  06-16-15 Neg:Neg HR HPV          OB History     Gravida  2   Para  1   Term      Preterm  1   AB      Living  0      SAB      IAB      Ectopic      Multiple      Live Births                 Patient Active Problem List   Diagnosis Date Noted   GERD (gastroesophageal reflux disease) 04/23/2019   Neoplasm of right kidney 04/20/2018   COPD  GOLD II  02/25/2017   Dyspnea on exertion 02/24/2017   Bronchitis 01/31/2017   Asthma exacerbation 01/30/2017   Sepsis ([place]) 01/30/2017   Influenza 01/30/2017   Depression with anxiety 01/30/2017   Acute respiratory failure with hypoxia ([place]) 01/30/2017   Anxiety     Past Medical History:  Diagnosis Date   Abnormal Pap smear of cervix 1990   hx cervical conization for abnormal pap--paps normal since   Anxiety    Arthritis    DENIES    Asthma    Cat allergies    COPD (chronic obstructive pulmonary disease) (HCC)    PONV (postoperative nausea and vomiting)     Past Surgical History:  Procedure Laterality Date   APPENDECTOMY  1997   CERVICAL CONIZATION W/BX  1990   AND CERVIX LESION DESTRUCTION     RETINAL TEAR REPAIR CRYOTHERAPY Right    ROBOTIC ASSITED PARTIAL NEPHRECTOMY Right 04/20/2018   Procedure: XI ROBOTIC ASSITED LAPAROSCOPIC PARTIAL NEPHRECTOMY;  Surgeon: Raynelle Bring, MD;  Location: [facility];  Service: Urology;  Laterality: Right;    Current Outpatient Medications  Medication Sig Dispense Refill   nitrofurantoin,  macrocrystal-monohydrate, (MACROBID) 100 MG capsule Take 1 capsule (100 mg total) by mouth 2 (two) times daily. Take twice a day for 5 days. 10 capsule 0   albuterol (VENTOLIN HFA) 108 (90 Base) MCG/ACT inhaler albuterol sulfate HFA 90 mcg/actuation aerosol inhaler  1 2 INHALATIONS EVERY 4 6 HOURS AS NEEDED FOR SHORTNESS OF BREATH.     budesonide-formoterol (SYMBICORT) 80-4.5 MCG/ACT inhaler TAKE 2 PUFFS BY MOUTH TWICE A DAY     escitalopram (LEXAPRO) 10 MG tablet 1 tablet     No current facility-administered medications for this visit.     ALLERGIES: Codeine and Oxycodone-acetaminophen  Family History  Problem Relation Age of Onset   Heart disease Maternal Grandmother    Heart disease Maternal Grandfather    Diabetes Maternal Grandfather    Heart disease Paternal Grandmother    Heart disease Paternal Grandfather    Breast cancer Mother 30   Breast cancer Sister 31   Heart disease Father    Stroke Father    Heart attack Father     Social History   Socioeconomic History   Marital status: Married    Spouse name: Herbie Baltimore   Number of  children: Not on file   Years of education: Not on file   Highest education level: Bachelor's degree (e.g., BA, AB, BS)  Occupational History   Not on file  Tobacco Use   Smoking status: Former    Packs/day: 0.50    Years: 10.00    Total pack years: 5.00    Types: Cigarettes    Start date: 01/25/2017    Quit date: 02/15/2018    Years since quitting: 3.9   Smokeless tobacco: Never   Tobacco comments:    LAST USES WAS EARLY DECEMBER 2019  Vaping Use   Vaping Use: Never used  Substance and Sexual Activity   Alcohol use: Not Currently    Alcohol/week: 0.0 standard drinks of alcohol    Comment: seldom   Drug use: No   Sexual activity: Yes    Partners: Male    Birth control/protection: Post-menopausal  Other Topics Concern   Not on file  Social History Narrative   Left handed   Lives in a one story home    Social Determinants of Health    Financial Resource Strain: Not on file  Food Insecurity: Not on file  Transportation Needs: Not on file  Physical Activity: Not on file  Stress: Not on file  Social Connections: Not on file  Intimate Partner Violence: Not on file    Review of Systems  PHYSICAL EXAMINATION:    LMP 02/15/2013 (Approximate)     General appearance: alert, cooperative and appears stated age Head: Normocephalic, without obvious abnormality, atraumatic Neck: no adenopathy, supple, symmetrical, trachea midline and thyroid normal to inspection and palpation Lungs: clear to auscultation bilaterally Breasts: normal appearance, no masses or tenderness, No nipple retraction or dimpling, No nipple discharge or bleeding, No axillary or supraclavicular adenopathy Heart: regular rate and rhythm Abdomen: soft, non-tender, no masses,  no organomegaly Extremities: extremities normal, atraumatic, no cyanosis or edema Skin: Skin color, texture, turgor normal. No rashes or lesions Lymph nodes: Cervical, supraclavicular, and axillary nodes normal. No abnormal inguinal nodes palpated Neurologic: Grossly normal  Pelvic: External genitalia:  no lesions              Urethra:  normal appearing urethra with no masses, tenderness or lesions              Bartholins and Skenes: normal                 Vagina: normal appearing vagina with normal color and discharge, no lesions              Cervix: no lesions                Bimanual Exam:  Uterus:  normal size, contour, position, consistency, mobility, non-tender              Adnexa: no mass, fullness, tenderness              Rectal exam: {yes no:[id number]}.  Confirms.              Anus:  normal sphincter tone, no lesions  Chaperone was present for exam:  ***  ASSESSMENT     PLAN     An After Visit Summary was printed and given to the patient.  ______ minutes face to face time of which over 50% was spent in counseling.

## 2022-01-13 ENCOUNTER — Encounter: Payer: Self-pay | Admitting: Obstetrics and Gynecology

## 2022-01-13 ENCOUNTER — Ambulatory Visit (INDEPENDENT_AMBULATORY_CARE_PROVIDER_SITE_OTHER): Payer: BC Managed Care – PPO

## 2022-01-13 ENCOUNTER — Ambulatory Visit: Payer: BC Managed Care – PPO | Admitting: Obstetrics and Gynecology

## 2022-01-13 VITALS — BP 132/80 | HR 78 | Ht 69.0 in | Wt 167.0 lb

## 2022-01-13 DIAGNOSIS — Z9189 Other specified personal risk factors, not elsewhere classified: Secondary | ICD-10-CM | POA: Diagnosis not present

## 2022-01-13 DIAGNOSIS — R102 Pelvic and perineal pain: Secondary | ICD-10-CM | POA: Diagnosis not present

## 2022-01-13 DIAGNOSIS — Z87440 Personal history of urinary (tract) infections: Secondary | ICD-10-CM | POA: Diagnosis not present

## 2022-01-13 DIAGNOSIS — R1900 Intra-abdominal and pelvic swelling, mass and lump, unspecified site: Secondary | ICD-10-CM | POA: Diagnosis not present

## 2022-01-26 ENCOUNTER — Ambulatory Visit: Payer: BC Managed Care – PPO

## 2022-01-28 NOTE — Telephone Encounter (Signed)
No returned call received from pt. Safe to assume she is doing better and close encounter or would you like me to check in? Please advise.

## 2022-01-29 NOTE — Telephone Encounter (Signed)
Pelvic US and office visit completed on 01/13/22.   Encounter closed.

## 2022-03-11 ENCOUNTER — Ambulatory Visit
Admission: RE | Admit: 2022-03-11 | Discharge: 2022-03-11 | Disposition: A | Discharge status: Home / Self Care | Payer: BC Managed Care – PPO | Source: Ambulatory Visit | Attending: Obstetrics and Gynecology | Admitting: Obstetrics and Gynecology

## 2022-03-11 DIAGNOSIS — Z1231 Encounter for screening mammogram for malignant neoplasm of breast: Secondary | ICD-10-CM

## 2022-03-16 ENCOUNTER — Other Ambulatory Visit: Payer: Self-pay | Admitting: Obstetrics and Gynecology

## 2022-03-16 DIAGNOSIS — R928 Other abnormal and inconclusive findings on diagnostic imaging of breast: Secondary | ICD-10-CM

## 2022-03-19 ENCOUNTER — Ambulatory Visit
Admission: RE | Admit: 2022-03-19 | Discharge: 2022-03-19 | Disposition: A | Discharge status: Home / Self Care | Payer: BC Managed Care – PPO | Source: Ambulatory Visit | Attending: Obstetrics and Gynecology | Admitting: Obstetrics and Gynecology

## 2022-03-19 ENCOUNTER — Other Ambulatory Visit: Payer: Self-pay | Admitting: Obstetrics and Gynecology

## 2022-03-19 DIAGNOSIS — R928 Other abnormal and inconclusive findings on diagnostic imaging of breast: Secondary | ICD-10-CM

## 2022-03-19 DIAGNOSIS — N6310 Unspecified lump in the right breast, unspecified quadrant: Secondary | ICD-10-CM

## 2022-03-25 ENCOUNTER — Ambulatory Visit
Admission: RE | Admit: 2022-03-25 | Discharge: 2022-03-25 | Disposition: A | Discharge status: Home / Self Care | Payer: BC Managed Care – PPO | Source: Ambulatory Visit | Attending: Obstetrics and Gynecology | Admitting: Obstetrics and Gynecology

## 2022-03-25 DIAGNOSIS — N6310 Unspecified lump in the right breast, unspecified quadrant: Secondary | ICD-10-CM

## 2022-03-25 DIAGNOSIS — R928 Other abnormal and inconclusive findings on diagnostic imaging of breast: Secondary | ICD-10-CM

## 2022-03-25 HISTORY — PX: BREAST BIOPSY: SHX20

## 2022-03-29 ENCOUNTER — Telehealth: Payer: Self-pay | Admitting: Obstetrics and Gynecology

## 2022-03-29 NOTE — Telephone Encounter (Signed)
Phone call with patient to discuss her right breast biopsy showing new dx of well differentiated ductal carcinoma.   I offered support for her care.

## 2022-04-07 ENCOUNTER — Encounter: Payer: Self-pay | Admitting: Registered Nurse

## 2022-04-08 ENCOUNTER — Other Ambulatory Visit: Payer: Self-pay | Admitting: General Surgery

## 2022-04-08 DIAGNOSIS — Z170 Estrogen receptor positive status [ER+]: Secondary | ICD-10-CM

## 2022-04-09 ENCOUNTER — Telehealth: Payer: Self-pay | Admitting: Hematology and Oncology

## 2022-04-09 ENCOUNTER — Telehealth: Payer: Self-pay | Admitting: Radiation Oncology

## 2022-04-09 ENCOUNTER — Other Ambulatory Visit: Payer: Self-pay | Admitting: *Deleted

## 2022-04-09 DIAGNOSIS — Z170 Estrogen receptor positive status [ER+]: Secondary | ICD-10-CM

## 2022-04-09 NOTE — Telephone Encounter (Signed)
LVM to schedule CON with Dr. Isidore Moos

## 2022-04-09 NOTE — Progress Notes (Signed)
Location of Breast Cancer:  Malignant neoplasm of upper-inner quadrant of right breast in female, estrogen receptor positive   Histology per Pathology Report:  03/25/2022 Breast, right, needle core biopsy, 2:00 5 cmfn, UIQ, coil clip - INVASIVE WELL-DIFFERENTIATED DUCTAL ADENOCARCINOMA, GRADE 1 (2+1+1) - TUBULE FORMATION: SCORE 2 - NUCLEAR PLEOMORPHISM: SCORE 1 - MITOTIC COUNT: SCORE 1 - TOTAL SCORE: 4 OF 9 - OVERALL GRADE: GRADE 1 - MICROCALCIFICATIONS PRESENT WITHIN INVASIVE TUMOR - NEGATIVE FOR ANGIOLYMPHATIC INVASION - TUMOR MEASURES 5.5 MM IN GREATEST LINEAR EXTENT  Receptor Status: ER(100%), PR (85%), Her2-neu (Negative), Ki-67(5%)  Did patient present with symptoms (if so, please note symptoms) or was this found on screening mammography?: Screening mammogram showed a right breast distortion  Past/Anticipated interventions by surgeon, if any:  04/08/2022 --Dr. Rolm Bookbinder (office visit) We discussed genetic testing. This is indicated given her family history especially in first-degree relatives with a young age at their diagnosis.  We discussed the options for treatment of the breast cancer which included lumpectomy versus a mastectomy.  She understands her further therapy will be based on what her stages at the time of her operation.   Past/Anticipated interventions by medical oncology, if any:  Meeting with Dr. Nicholas Lose later today  Lymphedema issues, if any:  Denies    Pain issues, if any:  Denies   SAFETY ISSUES: Prior radiation? No Pacemaker/ICD? No Possible current pregnancy? No--postmenopausal Is the patient on methotrexate? No  Current Complaints / other details:  Reports her surgery is waiting to be scheduled until her genetics results are received

## 2022-04-09 NOTE — Telephone Encounter (Signed)
Scheduled appt per 2/22 referral. Called pt, no answer. Left msg on both numbers provided letting her know appt date/time. Requested for pt to call me back to confirm appt. Left my direct call back number.

## 2022-04-12 NOTE — Progress Notes (Signed)
Radiation Oncology         [phone] ________________________________  Initial Outpatient Consultation  Name: Rachael Peters MRN: [id number]  Date: 04/13/2022  DOB: [birth year]  [id number], Rachael Reader, FNP  Rolm Bookbinder, MD   REFERRING PHYSICIAN: Rolm Bookbinder, MD  DIAGNOSIS: No diagnosis found.   Cancer Staging  No matching staging information was found for the patient.  Stage *** Right Breast UIQ, Invasive Ductal Carcinoma, ER+ / PR+ / Her2-, Grade 1  CHIEF COMPLAINT: Here to discuss management of right breast cancer  HISTORY OF PRESENT ILLNESS::Rachael Peters is a 62 y.o. female who presented with a right breast abnormality on the following imaging: bilateral screening mammogram on the date of 03/11/22. No symptoms, if any, were reported at that time. Diagnostic right breast mammogram and right breast ultrasound on 03/19/22 showed a highly suspicious mass in the upper inner right breast (2 o'clock position), 5 cmfn, measuring approximately 0.8 x 0.6 x 0.8 cm. No evidence of right axillary lymphadenopathy was appreciated.   Biopsy of the 2 o'clock right breast on date of 03/25/22 showed grade 1 invasive ductal carcinoma measuring 5.5 mm in the greatest linear extent of the sample, with microcalcifications present within the invasive tumor.  ER status: 100% positive with strong staining intensity; PR status 85% positive with moderate-strong staining intensity; Proliferation marker Ki67 at 5%; Her2 status negative; Grade 1.  Accordingly, the patient was referred to Dr. Donne Hazel on 04/08/22 to discuss treatment options. Based on her strong family history of breast cancer in first-degree relatives (noted below), the patient agreed to pursue genetic testing. Dr. Donne Hazel would like to wait until her genetic testing results come back prior to making a final decision regarding surgery.   The patient is also meeting with Dr. Lindi Adie later today.   Of note: The patient  has a family history significant for breast cancer in her sister at age 24 and her mom at age 55. She also has a grandmother that had renal cell cancer.   ***  PREVIOUS RADIATION THERAPY: No  PAST MEDICAL HISTORY:  has a past medical history of Abnormal Pap smear of cervix (1990), Anxiety, Arthritis, Asthma, Cat allergies, COPD (chronic obstructive pulmonary disease) ([place]), and PONV (postoperative nausea and vomiting).    PAST SURGICAL HISTORY: Past Surgical History:  Procedure Laterality Date   APPENDECTOMY  1997   BREAST BIOPSY Right 03/25/2022   [place] RT BREAST BX W LOC DEV 1ST LESION IMG BX SPEC US GUIDE 03/25/2022 [facility]   CERVICAL CONIZATION W/BX  1990   AND CERVIX LESION DESTRUCTION     RETINAL TEAR REPAIR CRYOTHERAPY Right    ROBOTIC ASSITED PARTIAL NEPHRECTOMY Right 04/20/2018   Procedure: XI ROBOTIC ASSITED LAPAROSCOPIC PARTIAL NEPHRECTOMY;  Surgeon: Raynelle Bring, MD;  Location: [facility];  Service: Urology;  Laterality: Right;    FAMILY HISTORY: family history includes Breast cancer (age of onset: 81) in her sister; Breast cancer (age of onset: 69) in her mother; Diabetes in her maternal grandfather; Heart attack in her father; Heart disease in her father, maternal grandfather, maternal grandmother, paternal grandfather, and paternal grandmother; Stroke in her father.  SOCIAL HISTORY:  reports that she quit smoking about 4 years ago. Her smoking use included cigarettes. She started smoking about 5 years ago. She has a 5.00 pack-year smoking history. She has never used smokeless tobacco. She reports that she does not currently use alcohol. She reports that she does not use drugs.  ALLERGIES: Codeine and Oxycodone-acetaminophen  MEDICATIONS:  Current Outpatient Medications  Medication Sig Dispense Refill   albuterol (VENTOLIN HFA) 108 (90 Base) MCG/ACT inhaler albuterol sulfate HFA 90 mcg/actuation aerosol inhaler  1 2 INHALATIONS EVERY 4 6 HOURS AS NEEDED FOR SHORTNESS OF  BREATH.     budesonide-formoterol (SYMBICORT) 80-4.5 MCG/ACT inhaler TAKE 2 PUFFS BY MOUTH TWICE A DAY     escitalopram (LEXAPRO) 10 MG tablet 1 tablet     nitrofurantoin, macrocrystal-monohydrate, (MACROBID) 100 MG capsule Take 1 capsule (100 mg total) by mouth 2 (two) times daily. Take twice a day for 5 days. 10 capsule 0   No current facility-administered medications for this encounter.    REVIEW OF SYSTEMS: As above in HPI.   PHYSICAL EXAM:  vitals were not taken for this visit.   General: Alert and oriented, in no acute distress HEENT: Head is normocephalic. Extraocular movements are intact. Oropharynx is clear. Neck: Neck is supple, no palpable cervical or supraclavicular lymphadenopathy. Heart: Regular in rate and rhythm with no murmurs, rubs, or gallops. Chest: Clear to auscultation bilaterally, with no rhonchi, wheezes, or rales. Abdomen: Soft, nontender, nondistended, with no rigidity or guarding. Extremities: No cyanosis or edema. Lymphatics: see Neck Exam Skin: No concerning lesions. Musculoskeletal: symmetric strength and muscle tone throughout. Neurologic: Cranial nerves II through XII are grossly intact. No obvious focalities. Speech is fluent. Coordination is intact. Psychiatric: Judgment and insight are intact. Affect is appropriate. Breasts: *** . No other palpable masses appreciated in the breasts or axillae *** .    ECOG = ***  0 - Asymptomatic (Fully active, able to carry on all predisease activities without restriction)  1 - Symptomatic but completely ambulatory (Restricted in physically strenuous activity but ambulatory and able to carry out work of a light or sedentary nature. For example, light housework, office work)  2 - Symptomatic, <50% in bed during the day (Ambulatory and capable of all self care but unable to carry out any work activities. Up and about more than 50% of waking hours)  3 - Symptomatic, >50% in bed, but not bedbound (Capable of only  limited self-care, confined to bed or chair 50% or more of waking hours)  4 - Bedbound (Completely disabled. Cannot carry on any self-care. Totally confined to bed or chair)  5 - Death   Eustace Pen MM, Creech RH, Tormey DC, et al. [phone]). "Toxicity and response criteria of the [facility]". [place] Oncol. 5 (6): 649-55   LABORATORY DATA:  Lab Results  Component Value Date   WBC 5.4 09/26/2018   HGB 13.6 09/26/2018   HCT 43.2 09/26/2018   MCV 92 09/26/2018   PLT 177 09/26/2018   CMP     Component Value Date/Time   NA 135 07/10/2020 1255   NA 138 09/26/2018 1122   K 3.3 (L) 07/10/2020 1255   CL 99 07/10/2020 1255   CO2 30 07/10/2020 1255   GLUCOSE 78 07/10/2020 1255   BUN 10 07/10/2020 1255   BUN 13 09/26/2018 1122   CREATININE 0.88 07/10/2020 1255   CREATININE 0.88 06/21/2016 1308   CALCIUM 8.8 07/10/2020 1255   PROT 7.1 09/26/2018 1122   ALBUMIN 4.5 09/26/2018 1122   AST 19 09/26/2018 1122   ALT 15 09/26/2018 1122   ALKPHOS 76 09/26/2018 1122   BILITOT 0.4 09/26/2018 1122   GFRNONAA 63 09/26/2018 1122   GFRAA 73 09/26/2018 1122         RADIOGRAPHY: [place] RT BREAST BX W LOC DEV 1ST LESION IMG BX  SPEC US GUIDE  Addendum Date: 03/29/2022   ADDENDUM REPORT: 03/29/2022 09:15 ADDENDUM: Pathology revealed GRADE I INVASIVE WELL-DIFFERENTIATED DUCTAL ADENOCARCINOMA, MICROCALCIFICATIONS PRESENT WITHIN INVASIVE TUMOR of the RIGHT breast, 2:00 o'clock, 5 cmfn, (coil clip). This was found to be concordant by Dr. [name]. Pathology results were discussed with the patient by telephone. The patient reported doing well after the biopsy with tenderness at the site. Post biopsy instructions and care were reviewed and questions were answered. The patient was encouraged to call [facility] for any additional concerns. My direct phone number was provided. Surgical consultation has been arranged with Dr. Rolm Bookbinder, per patient  request, at [facility] on April 07, 2022. Pathology results reported by Terie Purser, RN on 03/29/2022. Electronically Signed   By: [name] M.D.   On: 03/29/2022 09:15   Result Date: 03/29/2022 CLINICAL DATA:  Patient presents for ultrasound-guided core biopsy of RIGHT breast mass EXAM: ULTRASOUND GUIDED RIGHT BREAST CORE NEEDLE BIOPSY COMPARISON:  Previous exam(s). PROCEDURE: I met with the patient and we discussed the procedure of ultrasound-guided biopsy, including benefits and alternatives. We discussed the high likelihood of a successful procedure. We discussed the risks of the procedure, including infection, bleeding, tissue injury, clip migration, and inadequate sampling. Informed written consent was given. The usual time-out protocol was performed immediately prior to the procedure. Lesion quadrant: UPPER INNER QUADRANT RIGHT breast Using sterile technique and 1% Lidocaine as local anesthetic, under direct ultrasound visualization, a 14 gauge spring-loaded device was used to perform biopsy of mass in the 2 o'clock location of the RIGHT breast using a inferior to superior approach. At the conclusion of the procedure coil shaped tissue marker clip was deployed into the biopsy cavity. Follow up 2 view mammogram was performed and dictated separately. IMPRESSION: Ultrasound guided biopsy of RIGHT breast mass. No apparent complications. Electronically Signed: By: [name] M.D. On: 03/25/2022 13:23  MM CLIP PLACEMENT RIGHT  Result Date: 03/25/2022 CLINICAL DATA:  Status post ultrasound-guided core biopsy of RIGHT breast mass EXAM: 3D DIAGNOSTIC RIGHT MAMMOGRAM POST ULTRASOUND BIOPSY COMPARISON:  Previous exam(s). FINDINGS: 3D Mammographic images were obtained following ultrasound guided biopsy of mass in the 2 o'clock location of the RIGHT breast and placement of a coil shaped. The biopsy marking clip is in expected position at the site of biopsy. IMPRESSION: Appropriate  positioning of the coil shaped biopsy marking clip at the site of biopsy in the UPPER INNER QUADRANT RIGHT breast. Final Assessment: Post Procedure Mammograms for Marker Placement Electronically Signed   By: [name] M.D.   On: 03/25/2022 13:48  MM DIAG BREAST TOMO UNI RIGHT  Result Date: 03/19/2022 CLINICAL DATA:  Recall from screening mammography, possible mass with associated architectural distortion involving the upper RIGHT breast, most conspicuous on the MLO view. EXAM: DIGITAL DIAGNOSTIC UNILATERAL RIGHT MAMMOGRAM WITH TOMOSYNTHESIS; ULTRASOUND RIGHT BREAST LIMITED TECHNIQUE: Right digital diagnostic mammography and breast tomosynthesis was performed.; Targeted ultrasound examination of the right breast was performed COMPARISON:  Previous exam(s). ACR Breast Density Category c: The breast tissue is heterogeneously dense, which may obscure small masses. FINDINGS: Spot-compression MLO view of the area of concern and a full field mediolateral view were obtained. Irregular isodense to hyperdense mass in the upper breast at posterior depth, associated with architectural distortion, measuring on the order of 1 cm in size. There are no associated suspicious calcifications. On the full field mediolateral view, the mass/distortion are more superiorly located than its location on the  screening MLO view, indicating its location in the inner breast, therefore [place]. Targeted ultrasound is performed, demonstrating an irregular hypoechoic mass with angular margins at the 2 o'clock position 5 cm from the nipple at middle to posterior depth measuring approximately 0.8 x 0.6 x 0.8 cm, demonstrating posterior acoustic shadowing and demonstrating internal power Doppler flow. Sonographic evaluation of the RIGHT axilla demonstrates no pathologic lymphadenopathy. On correlative physical examination, I am unable to palpate the mass in the [place]. IMPRESSION: 1. Highly suspicious 0.8 cm mass  involving the UPPER INNER QUADRANT of the RIGHT breast at the 2 o'clock position 5 cm from the nipple. 2. No pathologic RIGHT axillary lymphadenopathy. RECOMMENDATION: Ultrasound-guided core needle biopsy of the RIGHT breast mass. I have discussed the findings and recommendations with the patient. The ultrasound core needle biopsy procedure was discussed with the patient and her questions were answered. She wishes to proceed with the biopsy which has been scheduled by the Breast Imaging staff. BI-RADS CATEGORY  5: Highly suggestive of malignancy. Electronically Signed   By: Evangeline Dakin M.D.   On: 03/19/2022 14:28  US BREAST LTD UNI RIGHT INC AXILLA  Result Date: 03/19/2022 CLINICAL DATA:  Recall from screening mammography, possible mass with associated architectural distortion involving the upper RIGHT breast, most conspicuous on the MLO view. EXAM: DIGITAL DIAGNOSTIC UNILATERAL RIGHT MAMMOGRAM WITH TOMOSYNTHESIS; ULTRASOUND RIGHT BREAST LIMITED TECHNIQUE: Right digital diagnostic mammography and breast tomosynthesis was performed.; Targeted ultrasound examination of the right breast was performed COMPARISON:  Previous exam(s). ACR Breast Density Category c: The breast tissue is heterogeneously dense, which may obscure small masses. FINDINGS: Spot-compression MLO view of the area of concern and a full field mediolateral view were obtained. Irregular isodense to hyperdense mass in the upper breast at posterior depth, associated with architectural distortion, measuring on the order of 1 cm in size. There are no associated suspicious calcifications. On the full field mediolateral view, the mass/distortion are more superiorly located than its location on the screening MLO view, indicating its location in the inner breast, therefore [place]. Targeted ultrasound is performed, demonstrating an irregular hypoechoic mass with angular margins at the 2 o'clock position 5 cm from the nipple at middle to  posterior depth measuring approximately 0.8 x 0.6 x 0.8 cm, demonstrating posterior acoustic shadowing and demonstrating internal power Doppler flow. Sonographic evaluation of the RIGHT axilla demonstrates no pathologic lymphadenopathy. On correlative physical examination, I am unable to palpate the mass in the [place]. IMPRESSION: 1. Highly suspicious 0.8 cm mass involving the UPPER INNER QUADRANT of the RIGHT breast at the 2 o'clock position 5 cm from the nipple. 2. No pathologic RIGHT axillary lymphadenopathy. RECOMMENDATION: Ultrasound-guided core needle biopsy of the RIGHT breast mass. I have discussed the findings and recommendations with the patient. The ultrasound core needle biopsy procedure was discussed with the patient and her questions were answered. She wishes to proceed with the biopsy which has been scheduled by the Breast Imaging staff. BI-RADS CATEGORY  5: Highly suggestive of malignancy. Electronically Signed   By: Evangeline Dakin M.D.   On: 03/19/2022 14:28     IMPRESSION/PLAN: ***   It was a pleasure meeting the patient today. We discussed the risks, benefits, and side effects of radiotherapy. I recommend radiotherapy to the *** to reduce her risk of locoregional recurrence by 2/3.  We discussed that radiation would take approximately *** weeks to complete and that I would give the patient a few weeks to  heal following surgery before starting treatment planning. *** If chemotherapy were to be given, this would precede radiotherapy. We spoke about acute effects including skin irritation and fatigue as well as much less common late effects including internal organ injury or irritation. We spoke about the latest technology that is used to minimize the risk of late effects for patients undergoing radiotherapy to the breast or chest wall. No guarantees of treatment were given. The patient is enthusiastic about proceeding with treatment. I look forward to participating in the  patient's care.  I will await her referral back to me for postoperative follow-up and eventual CT simulation/treatment planning.  On date of service, in total, I spent *** minutes on this encounter. Patient was seen in person.   __________________________________________   Eppie Gibson, MD  This document serves as a record of services personally performed by Eppie Gibson, MD. It was created on her behalf by Roney Mans, a trained medical scribe. The creation of this record is based on the scribe's personal observations and the provider's statements to them. This document has been checked and approved by the attending provider.

## 2022-04-13 ENCOUNTER — Encounter: Payer: Self-pay | Admitting: Radiation Oncology

## 2022-04-13 ENCOUNTER — Ambulatory Visit
Admission: RE | Admit: 2022-04-13 | Discharge: 2022-04-13 | Disposition: A | Discharge status: Home / Self Care | Payer: BC Managed Care – PPO | Source: Ambulatory Visit | Attending: Radiation Oncology | Admitting: Radiation Oncology

## 2022-04-13 ENCOUNTER — Other Ambulatory Visit: Payer: Self-pay

## 2022-04-13 ENCOUNTER — Inpatient Hospital Stay: Payer: BC Managed Care – PPO | Attending: Hematology and Oncology | Admitting: Hematology and Oncology

## 2022-04-13 VITALS — BP 129/85 | HR 60 | Temp 98.1°F | Resp 18 | Wt 172.9 lb

## 2022-04-13 VITALS — BP 109/68 | HR 57 | Temp 97.3°F | Resp 18 | Ht 69.0 in | Wt 172.6 lb

## 2022-04-13 DIAGNOSIS — Z79899 Other long term (current) drug therapy: Secondary | ICD-10-CM | POA: Insufficient documentation

## 2022-04-13 DIAGNOSIS — Z803 Family history of malignant neoplasm of breast: Secondary | ICD-10-CM | POA: Diagnosis not present

## 2022-04-13 DIAGNOSIS — Z7951 Long term (current) use of inhaled steroids: Secondary | ICD-10-CM | POA: Insufficient documentation

## 2022-04-13 DIAGNOSIS — Z170 Estrogen receptor positive status [ER+]: Secondary | ICD-10-CM | POA: Diagnosis not present

## 2022-04-13 DIAGNOSIS — C50211 Malignant neoplasm of upper-inner quadrant of right female breast: Secondary | ICD-10-CM

## 2022-04-13 DIAGNOSIS — J449 Chronic obstructive pulmonary disease, unspecified: Secondary | ICD-10-CM | POA: Insufficient documentation

## 2022-04-13 DIAGNOSIS — Z87891 Personal history of nicotine dependence: Secondary | ICD-10-CM | POA: Insufficient documentation

## 2022-04-13 NOTE — Assessment & Plan Note (Signed)
03/25/2022:Screening mammogram detected right breast mass 0.8 cm, axilla negative, biopsy: Grade 1 IDC ER 100%, PR 85%, Ki67 5%, HER2 0  Pathology and radiology counseling:Discussed with the patient, the details of pathology including the type of breast cancer,the clinical staging, the significance of ER, PR and HER-2/neu receptors and the implications for treatment. After reviewing the pathology in detail, we proceeded to discuss the different treatment options between surgery, radiation, chemotherapy, antiestrogen therapies.  Recommendations: 1. Breast conserving surgery followed by 2. Oncotype DX testing if the final tumor size is greater than 1 cm 3. Adjuvant radiation therapy followed by 4. Adjuvant antiestrogen therapy  Oncotype counseling: I discussed Oncotype DX test. I explained to the patient that this is a 21 gene panel to evaluate patient tumors DNA to calculate recurrence score. This would help determine whether patient has high risk or low risk breast cancer. She understands that if her tumor was found to be high risk, she would benefit from systemic chemotherapy. If low risk, no need of chemotherapy.  Return to clinic after surgery to discuss final pathology report and then determine if Oncotype DX testing will need to be sent.

## 2022-04-13 NOTE — Progress Notes (Signed)
[facility] CONSULT NOTE  Patient Care Team: Holland Commons, FNP as PCP - General (Internal Medicine)  CHIEF COMPLAINTS/PURPOSE OF CONSULTATION:  Newly diagnosed breast cancer  HISTORY OF PRESENTING ILLNESS:  Rachael Peters 62 y.o. female is here because of recent diagnosis of right breast cancer.  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  She was seen by Dr. Donne Hazel who is planning to do a lumpectomy but he referred her to genetics because of her extensive family history.  She is here today accompanied by her husband to discuss her treatment plan.  I reviewed her records extensively and collaborated the history with the patient.  SUMMARY OF ONCOLOGIC HISTORY: Oncology History  Malignant neoplasm of upper-inner quadrant of right breast in female, estrogen receptor positive ([place])  03/25/2022 Initial Diagnosis   Screening mammogram detected right breast mass 0.8 cm, axilla negative, biopsy: Grade 1 IDC ER 100%, PR 85%, Ki67 5%, HER2 0   04/13/2022 Cancer Staging   Staging form: Breast, AJCC 8th Edition - Clinical: Stage IA (cT1b, cN0, cM0, G1, ER+, PR+, HER2-) - Signed by Nicholas Lose, MD on 04/13/2022 Stage prefix: Initial diagnosis Histologic grading system: 3 grade system      MEDICAL HISTORY:  Past Medical History:  Diagnosis Date   Abnormal Pap smear of cervix 1990   hx cervical conization for abnormal pap--paps normal since   Anxiety    Asthma    Cat allergies    COPD (chronic obstructive pulmonary disease) (HCC)    PONV (postoperative nausea and vomiting)     SURGICAL HISTORY: Past Surgical History:  Procedure Laterality Date   APPENDECTOMY  1997   BREAST BIOPSY Right 03/25/2022   [place] RT BREAST BX W LOC DEV 1ST LESION IMG BX SPEC US GUIDE 03/25/2022 [facility]   CERVICAL CONIZATION W/BX   1990   AND CERVIX LESION DESTRUCTION     RETINAL TEAR REPAIR CRYOTHERAPY Right    ROBOTIC ASSITED PARTIAL NEPHRECTOMY Right 04/20/2018   Procedure: XI ROBOTIC ASSITED LAPAROSCOPIC PARTIAL NEPHRECTOMY;  Surgeon: Raynelle Bring, MD;  Location: [facility];  Service: Urology;  Laterality: Right;    SOCIAL HISTORY: Social History   Socioeconomic History   Marital status: Married    Spouse name: Herbie [place]   Number of children: Not on file   Years of education: Not on file   Highest education level: Bachelor's degree (e.g., BA, AB, BS)  Occupational History   Not on file  Tobacco Use   Smoking status: Former    Packs/day: 0.50    Years: 10.00    Total pack years: 5.00    Types: Cigarettes    Start date: 01/25/2017    Quit date: 02/15/2018    Years since quitting: 4.1   Smokeless tobacco: Never   Tobacco comments:    LAST USES WAS EARLY DECEMBER 2019  Vaping Use   Vaping Use: Never used  Substance and Sexual Activity   Alcohol use: Not Currently    Alcohol/week: 0.0 standard drinks of alcohol    Comment: seldom   Drug use: No   Sexual activity: Yes    Partners: Male    Birth control/protection: Post-menopausal  Other Topics Concern   Not on file  Social History Narrative   Left handed   Lives in a one story home  Social Determinants of Health   Financial Resource Strain: Not on file  Food Insecurity: Not on file  Transportation Needs: Not on file  Physical Activity: Not on file  Stress: Not on file  Social Connections: Not on file  Intimate Partner Violence: Not on file    FAMILY HISTORY: Family History  Problem Relation Age of Onset   Heart disease Maternal Grandmother    Heart disease Maternal Grandfather    Diabetes Maternal Grandfather    Heart disease Paternal Grandmother    Heart disease Paternal Grandfather    Breast cancer Mother 25   Breast cancer Sister 39   Heart disease Father    Stroke Father    Heart attack Father     ALLERGIES:  is allergic to  codeine.  MEDICATIONS:  Current Outpatient Medications  Medication Sig Dispense Refill   albuterol (VENTOLIN HFA) 108 (90 Base) MCG/ACT inhaler albuterol sulfate HFA 90 mcg/actuation aerosol inhaler  1 2 INHALATIONS EVERY 4 6 HOURS AS NEEDED FOR SHORTNESS OF BREATH.     ALPRAZolam (XANAX) 0.25 MG tablet Take 0.25 mg by mouth 2 (two) times daily as needed for anxiety.     budesonide-formoterol (SYMBICORT) 80-4.5 MCG/ACT inhaler TAKE 2 PUFFS BY MOUTH TWICE A DAY     Cyanocobalamin 3000 MCG SUBL Place 1 tablet under the tongue daily.     escitalopram (LEXAPRO) 10 MG tablet 1 tablet     meclizine (ANTIVERT) 25 MG tablet Take 25 mg by mouth 2 (two) times daily as needed for dizziness.     nitrofurantoin, macrocrystal-monohydrate, (MACROBID) 100 MG capsule Take 1 capsule (100 mg total) by mouth 2 (two) times daily. Take twice a day for 5 days. 10 capsule 0   No current facility-administered medications for this visit.    REVIEW OF SYSTEMS:   Constitutional: Denies fevers, chills or abnormal night sweats Breast:  Denies any palpable lumps or discharge All other systems were reviewed with the patient and are negative.  PHYSICAL EXAMINATION: ECOG PERFORMANCE STATUS: 0 - Asymptomatic  Vitals:   04/13/22 1541  BP: 129/85  Pulse: 60  Resp: 18  Temp: 98.1 F (36.7 C)  SpO2: 100%   Filed Weights   04/13/22 1541  Weight: 172 lb 14.4 oz (78.4 kg)    GENERAL:alert, no distress and comfortable     LABORATORY DATA:  I have reviewed the data as listed Lab Results  Component Value Date   WBC 5.4 09/26/2018   HGB 13.6 09/26/2018   HCT 43.2 09/26/2018   MCV 92 09/26/2018   PLT 177 09/26/2018   Lab Results  Component Value Date   NA 135 07/10/2020   K 3.3 (L) 07/10/2020   CL 99 07/10/2020   CO2 30 07/10/2020    RADIOGRAPHIC STUDIES: I have personally reviewed the radiological reports and agreed with the findings in the report.  ASSESSMENT AND PLAN:  Malignant neoplasm of  upper-inner quadrant of right breast in female, estrogen receptor positive ([place]) 03/25/2022:Screening mammogram detected right breast mass 0.8 cm, axilla negative, biopsy: Grade 1 IDC ER 100%, PR 85%, Ki67 5%, HER2 0  Pathology and radiology counseling:Discussed with the patient, the details of pathology including the type of breast cancer,the clinical staging, the significance of ER, PR and HER-2/neu receptors and the implications for treatment. After reviewing the pathology in detail, we proceeded to discuss the different treatment options between surgery, radiation, chemotherapy, antiestrogen therapies.  Recommendations: 1. Breast conserving surgery followed by 2. Oncotype DX testing if the final  tumor size is greater than 1 cm 3. Adjuvant radiation therapy followed by 4. Adjuvant antiestrogen therapy  Oncotype counseling: I discussed Oncotype DX test. I explained to the patient that this is a 21 gene panel to evaluate patient tumors DNA to calculate recurrence score. This would help determine whether patient has high risk or low risk breast cancer. She understands that if her tumor was found to be high risk, she would benefit from systemic chemotherapy. If low risk, no need of chemotherapy.  Return to clinic after surgery to discuss final pathology report and then determine if Oncotype DX testing will need to be sent.   All questions were answered. The patient knows to call the clinic with any problems, questions or concerns.    Harriette Ohara, MD 04/13/22

## 2022-04-14 ENCOUNTER — Other Ambulatory Visit: Payer: Self-pay | Admitting: General Surgery

## 2022-04-14 ENCOUNTER — Other Ambulatory Visit: Payer: Self-pay | Admitting: *Deleted

## 2022-04-14 ENCOUNTER — Encounter: Payer: Self-pay | Admitting: *Deleted

## 2022-04-14 DIAGNOSIS — C50211 Malignant neoplasm of upper-inner quadrant of right female breast: Secondary | ICD-10-CM

## 2022-04-15 ENCOUNTER — Telehealth: Payer: Self-pay | Admitting: Hematology and Oncology

## 2022-04-15 ENCOUNTER — Telehealth: Payer: Self-pay | Admitting: *Deleted

## 2022-04-15 NOTE — Telephone Encounter (Signed)
Per 2/29 Ib reached out to schedule patient, left voicemail.

## 2022-04-15 NOTE — Telephone Encounter (Signed)
Spoke with patient to follow up from new pt. Appt and assess navigation needs. Patient denies any questions or concerns at this time.  Contact information given and encouraged her to call should anything arise.

## 2022-04-18 NOTE — Therapy (Signed)
OUTPATIENT PHYSICAL THERAPY BREAST CANCER BASELINE EVALUATION   Patient Name: Rachael VANDERWERFF MRN: [id number] DOB:01-Jun-1960, 62 y.o., female Today's Date: 04/18/2022  END OF SESSION:   Past Medical History:  Diagnosis Date   Abnormal Pap smear of cervix 1990   hx cervical conization for abnormal pap--paps normal since   Anxiety    Asthma    Cat allergies    COPD (chronic obstructive pulmonary disease) (HCC)    PONV (postoperative nausea and vomiting)    Past Surgical History:  Procedure Laterality Date   APPENDECTOMY  1997   BREAST BIOPSY Right 03/25/2022   [place] RT BREAST BX W LOC DEV 1ST LESION IMG BX SPEC US GUIDE 03/25/2022 [facility]   CERVICAL CONIZATION W/BX  1990   AND CERVIX LESION DESTRUCTION     RETINAL TEAR REPAIR CRYOTHERAPY Right    ROBOTIC ASSITED PARTIAL NEPHRECTOMY Right 04/20/2018   Procedure: XI ROBOTIC ASSITED LAPAROSCOPIC PARTIAL NEPHRECTOMY;  Surgeon: Raynelle Bring, MD;  Location: [facility];  Service: Urology;  Laterality: Right;   Patient Active Problem List   Diagnosis Date Noted   Malignant neoplasm of upper-inner quadrant of right breast in female, estrogen receptor positive ([place]) 04/09/2022   GERD (gastroesophageal reflux disease) 04/23/2019   Neoplasm of right kidney 04/20/2018   COPD  GOLD II  02/25/2017   Dyspnea on exertion 02/24/2017   Bronchitis 01/31/2017   Asthma exacerbation 01/30/2017   Sepsis ([place]) 01/30/2017   Influenza 01/30/2017   Depression with anxiety 01/30/2017   Acute respiratory failure with hypoxia ([place]) 01/30/2017   Anxiety       REFERRING PROVIDER: Rolm Bookbinder, MD  REFERRING DIAG: Right Breast Cancer  THERAPY DIAG:  No diagnosis found.  Rationale for Evaluation and Treatment: Rehabilitation  ONSET DATE: 03/29/2022  SUBJECTIVE:                                                                                                                                                                                            SUBJECTIVE STATEMENT: Patient reports she is here today to be seen by her medical team for her newly diagnosed right breast cancer.   PERTINENT HISTORY:  Patient was diagnosed on 03/29/2022 with right grade 1 IDC. It measures .8 cm and is located in the upper-inner quadrant. It is ER/PR+, Her 2-  with a Ki67 of 5%.  She is scheduled for a Right Lumpectomy with SLNB on 05/04/2022  PATIENT GOALS:   reduce lymphedema risk and learn post op HEP.   PAIN:  Are you having pain? {OPRCPAIN:27236}  PRECAUTIONS: Active CA , COPD, asthma,anxiety  HAND DOMINANCE: {RIGHT/LEFT:21944}  WEIGHT BEARING  RESTRICTIONS: No  FALLS:  Has patient fallen in last 6 months? {fallsyesno:27318}  LIVING ENVIRONMENT: Patient lives with: *** Lives in: {Lives in:25570} Has following equipment at home: {Assistive devices:23999}  OCCUPATION: ***  LEISURE: ***  PRIOR LEVEL OF FUNCTION: {PLOF:24004}   OBJECTIVE:  COGNITION: Overall cognitive status: Within functional limits for tasks assessed    POSTURE:  Forward head and rounded shoulders posture  UPPER EXTREMITY AROM/PROM:  A/PROM RIGHT   eval   Shoulder extension   Shoulder flexion   Shoulder abduction   Shoulder internal rotation   Shoulder external rotation     (Blank rows = not tested)  A/PROM LEFT   eval  Shoulder extension   Shoulder flexion   Shoulder abduction   Shoulder internal rotation   Shoulder external rotation     (Blank rows = not tested)  CERVICAL AROM: All within normal limits:    UPPER EXTREMITY STRENGTH: ***  LYMPHEDEMA ASSESSMENTS:   LANDMARK RIGHT   eval  10 cm proximal to olecranon process   Olecranon process   10 cm proximal to ulnar styloid process   Just proximal to ulnar styloid process   Across hand at thumb web space   At base of 2nd digit   (Blank rows = not tested)  LANDMARK LEFT   eval  10 cm proximal to olecranon process   Olecranon process   10 cm proximal to ulnar styloid  process   Just proximal to ulnar styloid process   Across hand at thumb web space   At base of 2nd digit   (Blank rows = not tested)  L-DEX LYMPHEDEMA SCREENING:  The patient was assessed using the L-Dex machine today to produce a lymphedema index baseline score. The patient will be reassessed on a regular basis (typically every 3 months) to obtain new L-Dex scores. If the score is > 6.5 points away from his/her baseline score indicating onset of subclinical lymphedema, it will be recommended to wear a compression garment for 4 weeks, 12 hours per day and then be reassessed. If the score continues to be > 6.5 points from baseline at reassessment, we will initiate lymphedema treatment. Assessing in this manner has a 95% rate of preventing clinically significant lymphedema.    QUICK DASH SURVEY: ***  PATIENT EDUCATION:  Education details: Lymphedema risk reduction and post op shoulder/posture HEP Person educated: Patient Education method: Explanation, Demonstration, Handout Education comprehension: Patient verbalized understanding and returned demonstration  HOME EXERCISE PROGRAM: Patient was instructed today in a home exercise program today for post op shoulder range of motion. These included active assist shoulder flexion in sitting, scapular retraction, wall walking with shoulder abduction, and hands behind head external rotation.  She was encouraged to do these twice a day, holding 3 seconds and repeating 5 times when permitted by her physician.   ASSESSMENT:  CLINICAL IMPRESSION: Pts multidisciplinary medical team met prior to her assessments to determine a recommended treatment plan. She is planning to have a Right Lumpectomy with SLNB on 05/04/2022. She will benefit from a post op PT reassessment to determine needs and from L-Dex screens every 3 months for 2 years to detect subclinical lymphedema.  Pt will benefit from skilled therapeutic intervention to improve on the following  deficits: Decreased knowledge of precautions, impaired UE functional use, pain, decreased ROM, postural dysfunction.   PT treatment/interventions: ADL/self-care home management, pt/family education, therapeutic exercise  REHAB POTENTIAL: Good  CLINICAL DECISION MAKING: Stable/uncomplicated  EVALUATION COMPLEXITY: Low   GOALS: Goals reviewed  with patient? YES  LONG TERM GOALS: (STG=LTG)    Name Target Date Goal status  1 Pt will be able to verbalize understanding of pertinent lymphedema risk reduction practices relevant to her dx specifically related to skin care.  Baseline:  No knowledge 04/18/2022 Achieved at eval  2 Pt will be able to return demo and/or verbalize understanding of the post op HEP related to regaining shoulder ROM. Baseline:  No knowledge 04/18/2022 Achieved at eval  3 Pt will be able to verbalize understanding of the importance of attending the post op After Breast CA Class for further lymphedema risk reduction education and therapeutic exercise.  Baseline:  No knowledge 04/18/2022 Achieved at eval  4 Pt will demo she has regained full shoulder ROM and function post operatively compared to baselines.  Baseline: See objective measurements taken today. ***     PLAN:  PT FREQUENCY/DURATION: EVAL and 1 follow up appointment.   PLAN FOR NEXT SESSION: will reassess 3-4 weeks post op to determine needs.   Patient will follow up at outpatient cancer rehab 3-4 weeks following surgery.  If the patient requires physical therapy at that time, a specific plan will be dictated and sent to the referring physician for approval. The patient was educated today on appropriate basic range of motion exercises to begin post operatively and the importance of attending the After Breast Cancer class following surgery.  Patient was educated today on lymphedema risk reduction practices as it pertains to recommendations that will benefit the patient immediately following surgery.  She verbalized  good understanding.    Physical Therapy Information for After Breast Cancer Surgery/Treatment:  Lymphedema is a swelling condition that you may be at risk for in your arm if you have lymph nodes removed from the armpit area.  After a sentinel node biopsy, the risk is approximately 5-9% and is higher after an axillary node dissection.  There is treatment available for this condition and it is not life-threatening.  Contact your physician or physical therapist with concerns. You may begin the 4 shoulder/posture exercises (see additional sheet) when permitted by your physician (typically a week after surgery).  If you have drains, you may need to wait until those are removed before beginning range of motion exercises.  A general recommendation is to not lift your arms above shoulder height until drains are removed.  These exercises should be done to your tolerance and gently.  This is not a "no pain/no gain" type of recovery so listen to your body and stretch into the range of motion that you can tolerate, stopping if you have pain.  If you are having immediate reconstruction, ask your plastic surgeon about doing exercises as he or she may want you to wait. We encourage you to attend the free one time ABC (After Breast Cancer) class offered by [facility].  You will learn information related to lymphedema risk, prevention and treatment and additional exercises to regain mobility following surgery.  You can call [phone] for more information.  This is offered the 1st and 3rd Monday of each month.  You only attend the class one time. While undergoing any medical procedure or treatment, try to avoid blood pressure being taken or needle sticks from occurring on the arm on the side of cancer.   This recommendation begins after surgery and continues for the rest of your life.  This may help reduce your risk of getting lymphedema (swelling in your arm). An excellent resource for those  seeking information on lymphedema is the National Lymphedema Network's web site. It can be accessed at [place].org If you notice swelling in your hand, arm or breast at any time following surgery (even if it is many years from now), please contact your doctor or physical therapist to discuss this.  Lymphedema can be treated at any time but it is easier for you if it is treated early on.  If you feel like your shoulder motion is not returning to normal in a reasonable amount of time, please contact your surgeon or physical therapist.  [facility] [phone]. [address], [address], [place] [address]  ABC CLASS After Breast Cancer Class  After Breast Cancer Class is a specially designed exercise class to assist you in a safe recover after having breast cancer surgery.  In this class you will learn how to get back to full function whether your drains were just removed or if you had surgery a month ago.  This one-time class is held the 1st and 3rd Monday of every month from 11:00 a.m. until 12:00 noon virtually.  This class is FREE and space is limited. For more information or to register for the next available class, call [phone].  Class Goals  Understand specific stretches to improve the flexibility of you chest and shoulder. Learn ways to safely strengthen your upper body and improve your posture. Understand the warning signs of infection and why you may be at risk for an arm infection. Learn about Lymphedema and prevention.  ** You do not attend this class until after surgery.  Drains must be removed to participate  Patient was instructed today in a home exercise program today for post op shoulder range of motion. These included active assist shoulder flexion in sitting, scapular retraction, wall walking with shoulder abduction, and hands behind head external rotation.  She was encouraged to do these twice a day, holding 3 seconds and  repeating 5 times when permitted by her physician.    Claris Pong, PT 04/18/2022, 10:48 AM

## 2022-04-19 ENCOUNTER — Other Ambulatory Visit: Payer: Self-pay

## 2022-04-19 ENCOUNTER — Ambulatory Visit: Payer: BC Managed Care – PPO | Attending: General Surgery

## 2022-04-19 ENCOUNTER — Telehealth: Payer: Self-pay | Admitting: Emergency Medicine

## 2022-04-19 DIAGNOSIS — Z170 Estrogen receptor positive status [ER+]: Secondary | ICD-10-CM | POA: Diagnosis not present

## 2022-04-19 DIAGNOSIS — R293 Abnormal posture: Secondary | ICD-10-CM | POA: Diagnosis not present

## 2022-04-19 DIAGNOSIS — C50211 Malignant neoplasm of upper-inner quadrant of right female breast: Secondary | ICD-10-CM | POA: Insufficient documentation

## 2022-04-19 NOTE — Telephone Encounter (Signed)
[employer] 2021-05 - Specimen Collection Study to Evaluate Biomarkers in Subjects with Cancer   Called to intro study to patient.  Patient is interested in participating.  Emailed copy of consent forms to patient ([email or web address]) for review and will f/u in next two days to determine interest.  Patient denies any questions or concerns at this time.  Wells Guiles 'Learta Codding' Neysa Bonito, RN, BSN, [facility] Clinical Research Nurse I 04/19/22 11:08 AM

## 2022-04-20 ENCOUNTER — Other Ambulatory Visit: Payer: Self-pay | Admitting: Registered Nurse

## 2022-04-20 ENCOUNTER — Inpatient Hospital Stay: Payer: BC Managed Care – PPO | Attending: Hematology and Oncology | Admitting: Licensed Clinical Social Worker

## 2022-04-20 DIAGNOSIS — R911 Solitary pulmonary nodule: Secondary | ICD-10-CM

## 2022-04-20 NOTE — Progress Notes (Signed)
[facility] Work  Initial Assessment   Rachael Peters is a 62 y.o. year old female contacted by phone. Clinical Social Work was referred by new patient protocol for assessment of psychosocial needs.   SDOH (Social Determinants of Health) assessments performed: Yes SDOH Interventions    Flowsheet Row Clinical Support from 04/20/2022 in [place] at [facility]  SDOH Interventions   Food Insecurity Interventions Intervention Not Indicated  Housing Interventions Intervention Not Indicated  Transportation Interventions Intervention Not Indicated  Financial Strain Interventions Intervention Not Indicated       SDOH Screenings   Food Insecurity: No Food Insecurity (04/20/2022)  Housing: Low Risk  (04/20/2022)  Transportation Needs: No Transportation Needs (04/20/2022)  Financial Resource Strain: Low Risk  (04/20/2022)  Tobacco Use: Medium Risk (04/19/2022)     Distress Screen completed: No     No data to display            Family/Social Information:  Housing Arrangement: patient lives with spouse Family members/support persons in your life? Family and Friends (including mom, sister and friends who have been through breast cancer before) Transportation concerns: no  Financial concerns: No Type of concern: None Food access concerns: no Religious or spiritual practice:  has faith Services Currently in place:  [employer]  Coping/ Adjustment to diagnosis: Patient understands treatment plan and what happens next? yes Concerns about diagnosis and/or treatment:  No major concerns- feels confident in her medical team Current coping skills/ strengths: Ability for insight , Capable of independent living , Communication skills , Motivation for treatment/growth , and Supportive family/friends     SUMMARY: Current SDOH Barriers:  No major barriers noted today  Clinical Social Work Clinical Goal(s):  No clinical social work goals at this  time  Interventions: Discussed common feeling and emotions when being diagnosed with cancer, and the importance of support during treatment Informed patient of the support team roles and support services at [facility] Provided [place] contact information and encouraged patient to call with any questions or concerns   Follow Up Plan: Patient will contact CSW with any support or resource needs Patient verbalizes understanding of plan: Yes    Savon Cobbs E Jahlon Baines, LCSW

## 2022-04-21 ENCOUNTER — Telehealth: Payer: Self-pay | Admitting: Emergency Medicine

## 2022-04-21 NOTE — Telephone Encounter (Signed)
[employer] 2021-05 - Specimen Collection Study to Evaluate Biomarkers in Subjects with Cancer   Called patient to f/u on interest in study.  No answer.  ROI on file.  Left VM requesting call back with contact info.  Will attempt to f/u again.  Wells Guiles 'Learta Codding' Neysa Bonito, RN, BSN, [facility] Clinical Research Nurse I 04/21/22 10:10 AM

## 2022-04-23 ENCOUNTER — Telehealth: Payer: Self-pay | Admitting: Emergency Medicine

## 2022-04-23 NOTE — Telephone Encounter (Signed)
[employer] 2021-05 - Specimen Collection Study to Evaluate Biomarkers in Subjects with Cancer   Called to f/u on interest in study.  Patient agreed to come in for consent/labs at 1030 on 04/27/22.  Appts created.  Pt aware to f/u before then with any questions or concerns.  Wells Guiles 'Learta Codding' Neysa Bonito, RN, BSN, [facility] Clinical Research Nurse I 04/23/22 1:22 PM

## 2022-04-26 ENCOUNTER — Encounter (HOSPITAL_BASED_OUTPATIENT_CLINIC_OR_DEPARTMENT_OTHER): Payer: Self-pay | Admitting: General Surgery

## 2022-04-26 ENCOUNTER — Other Ambulatory Visit: Payer: Self-pay

## 2022-04-27 ENCOUNTER — Inpatient Hospital Stay: Payer: BC Managed Care – PPO

## 2022-04-27 ENCOUNTER — Other Ambulatory Visit: Payer: Self-pay

## 2022-04-27 ENCOUNTER — Inpatient Hospital Stay: Payer: BC Managed Care – PPO | Admitting: Emergency Medicine

## 2022-04-27 DIAGNOSIS — C50211 Malignant neoplasm of upper-inner quadrant of right female breast: Secondary | ICD-10-CM

## 2022-04-27 DIAGNOSIS — Z170 Estrogen receptor positive status [ER+]: Secondary | ICD-10-CM

## 2022-04-27 LAB — RESEARCH LABS

## 2022-04-27 NOTE — Research (Signed)
[employer] 2021-05 - Specimen Collection Study to Evaluate Biomarkers in Subjects with Cancer   CONSENT  Patient Rachael Peters was identified by Clinical Research as a potential candidate for the above listed study.  This Clinical Research Nurse met with Rachael Peters, [id number] on 04/27/22 in a manner and location that ensures patient privacy to discuss participation in the above listed research study.  Patient is Unaccompanied.  Patient was previously provided with informed consent documents.  Patient confirmed they have read the informed consent documents.  As outlined in the informed consent form, this Nurse and Electa Sniff Hendler discussed the purpose of the research study, the investigational nature of the study, study procedures and requirements for study participation, potential risks and benefits of study participation, as well as alternatives to participation.  This study is not blinded or double-blinded. The patient understands participation is voluntary and they may withdraw from study participation at any time.  This study does not involve randomization.  This study does not involve an investigational drug or device. This study does not involve a placebo. Patient understands enrollment is pending full eligibility review.   Confidentiality and how the patient's information will be used as part of study participation were discussed.  Patient was informed there is reimbursement provided for their time and effort spent on trial participation.  The patient is encouraged to discuss research study participation with their insurance provider to determine what costs they may incur as part of study participation, including research related injury.    All questions were answered to patient's satisfaction.  The informed consent with embedded HIPAA language was reviewed page by page.  The patient's mental and emotional status is appropriate to provide informed consent, and the patient  verbalizes an understanding of study participation.  Patient has agreed to participate in the above listed research study and has voluntarily signed the informed consent version 02/29/20 (revised 03/16/21) with embedded HIPAA language, version 02/29/20 (revised 03/16/21)  on 04/27/22 at 1100AM.  The patient was provided with a copy of the signed informed consent form with embedded HIPAA language for their reference.  No study specific procedures were obtained prior to the signing of the informed consent document.  Approximately 30 minutes were spent with the patient reviewing the informed consent documents.  After obtaining informed consent patient, voluntarily signed the optional Release of Information form for use throughout trial participation.  Wells Guiles 'Learta Codding' Neysa Bonito, RN, BSN, [facility] Clinical Research Nurse I 04/27/22 12:11 PM

## 2022-04-27 NOTE — Research (Signed)
[employer] 2021-05 - Specimen Collection Study to Evaluate Biomarkers in Subjects with Cancer   ELIGIBILITY CHECK & HISTORY  Patient confirmed the following verbally today: - They are willing/able to comply with all study procedures. 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  Confirmed with chart review as well.  Per MD Lindi Adie patient has no condition that would interfere with ability to fully participate in enrollment or protocol.  Eligibility criteria reviewed with patient. This Nurse has reviewed this patient's inclusion and exclusion criteria and confirmed patient is eligible for study participation. Eligibility confirmed by treating investigator, who also agrees that patient should proceed with enrollment. Patient will continue with enrollment.  LMP was 02/15/2013 (patient post-menopausal).  Blood Collection: Research blood obtained by Fresh venipuncture per patient's preference. Patient tolerated well without any adverse events.  Gift Card: $50 gift card given to patient for her participation in this study.   Medical History:  High Blood Pressure  No Coronary Artery Disease No Lupus    No Rheumatoid Arthritis  No Diabetes   No      If yes, which type?      N/A Lynch Syndrome  No  Is the patient currently taking a magnesium supplement?   Yes If yes, dose and frequency? 500 mg nightly orally Indication? Constipation Start date? 02/15/2018  Does the patient have a personal history of cancer (greater than 5 years ago)?  No If yes, Cancer type and date of diagnosis?   N/A  Has this previous diagnosis been treated? N/A      If so, treatment type? N/A   Start and end dates of last  treatment cycle? N/A  Does the patient have a family history of cancer in 1st or 2nd degree relatives? Yes If yes, Relationship(s) and Cancer type(s)? Mother (breast), Sister (breast), Maternal Grandmother (kidney)  Does the patient have history of alcohol consumption? Yes   If yes, current or former? Current If former, year stopped? N/A Number of years? 41 Drinks per week? Seldom (less than 1 per week)  Does the patient have history of cigarette, cigar, pipe, or chewing tobacco use?  Yes  If yes, current for former? Former If yes, type (Cigarette, cigar, pipe, and/or chewing tobacco)? Cigarette   If former, year stopped? 2020 Number of years? 2 Packs/number/containers per day? 0.5 pack daily  Bobbyjo Marulanda 'Learta CoddingNeysa Bonito, RN, BSN, [facility] Clinical Research Nurse I 04/27/22 11:55 AM

## 2022-04-27 NOTE — Research (Signed)
[employer] 2021-05 - Specimen Collection Study to Evaluate Biomarkers in Subjects with Cancer     This Nurse has reviewed this patient's inclusion and exclusion criteria as a second review and confirms Rachael Peters is eligible for study participation.  Patient may continue with enrollment.   Jeral Fruit, RN 04/27/22 11:37 AM

## 2022-04-28 ENCOUNTER — Ambulatory Visit
Admission: RE | Admit: 2022-04-28 | Discharge: 2022-04-28 | Disposition: A | Discharge status: Home / Self Care | Payer: BC Managed Care – PPO | Source: Ambulatory Visit | Attending: Registered Nurse | Admitting: Registered Nurse

## 2022-04-28 DIAGNOSIS — R911 Solitary pulmonary nodule: Secondary | ICD-10-CM

## 2022-05-03 ENCOUNTER — Ambulatory Visit
Admission: RE | Admit: 2022-05-03 | Discharge: 2022-05-03 | Disposition: A | Discharge status: Home / Self Care | Payer: BC Managed Care – PPO | Source: Ambulatory Visit | Attending: General Surgery | Admitting: General Surgery

## 2022-05-03 ENCOUNTER — Other Ambulatory Visit: Payer: Self-pay | Admitting: General Surgery

## 2022-05-03 DIAGNOSIS — C50211 Malignant neoplasm of upper-inner quadrant of right female breast: Secondary | ICD-10-CM

## 2022-05-03 HISTORY — PX: BREAST BIOPSY: SHX20

## 2022-05-04 ENCOUNTER — Ambulatory Visit (HOSPITAL_BASED_OUTPATIENT_CLINIC_OR_DEPARTMENT_OTHER): Payer: BC Managed Care – PPO | Admitting: Anesthesiology

## 2022-05-04 ENCOUNTER — Encounter (HOSPITAL_BASED_OUTPATIENT_CLINIC_OR_DEPARTMENT_OTHER)
Admission: RE | Disposition: A | Discharge status: Home / Self Care | Payer: Self-pay | Source: Home / Self Care | Attending: General Surgery

## 2022-05-04 ENCOUNTER — Ambulatory Visit
Admission: RE | Admit: 2022-05-04 | Discharge: 2022-05-04 | Disposition: A | Discharge status: Home / Self Care | Payer: BC Managed Care – PPO | Source: Ambulatory Visit | Attending: General Surgery | Admitting: General Surgery

## 2022-05-04 ENCOUNTER — Other Ambulatory Visit: Payer: Self-pay

## 2022-05-04 ENCOUNTER — Ambulatory Visit (HOSPITAL_COMMUNITY)
Admission: RE | Admit: 2022-05-04 | Discharge: 2022-05-04 | Disposition: A | Discharge status: Home / Self Care | Payer: BC Managed Care – PPO | Attending: General Surgery | Admitting: General Surgery

## 2022-05-04 ENCOUNTER — Encounter (HOSPITAL_BASED_OUTPATIENT_CLINIC_OR_DEPARTMENT_OTHER): Payer: Self-pay | Admitting: General Surgery

## 2022-05-04 DIAGNOSIS — Z170 Estrogen receptor positive status [ER+]: Secondary | ICD-10-CM | POA: Diagnosis not present

## 2022-05-04 DIAGNOSIS — C50211 Malignant neoplasm of upper-inner quadrant of right female breast: Secondary | ICD-10-CM | POA: Diagnosis not present

## 2022-05-04 DIAGNOSIS — N6011 Diffuse cystic mastopathy of right breast: Secondary | ICD-10-CM | POA: Diagnosis not present

## 2022-05-04 DIAGNOSIS — Z09 Encounter for follow-up examination after completed treatment for conditions other than malignant neoplasm: Secondary | ICD-10-CM | POA: Insufficient documentation

## 2022-05-04 DIAGNOSIS — J45909 Unspecified asthma, uncomplicated: Secondary | ICD-10-CM | POA: Diagnosis not present

## 2022-05-04 DIAGNOSIS — F419 Anxiety disorder, unspecified: Secondary | ICD-10-CM | POA: Diagnosis not present

## 2022-05-04 DIAGNOSIS — Z87891 Personal history of nicotine dependence: Secondary | ICD-10-CM | POA: Insufficient documentation

## 2022-05-04 HISTORY — PX: BREAST LUMPECTOMY WITH RADIOACTIVE SEED AND SENTINEL LYMPH NODE BIOPSY: SHX6550

## 2022-05-04 SURGERY — BREAST LUMPECTOMY WITH RADIOACTIVE SEED AND SENTINEL LYMPH NODE BIOPSY
Anesthesia: Regional | Site: Breast | Laterality: Right

## 2022-05-04 MED ORDER — ENSURE PRE-SURGERY PO LIQD: 296.0000 mL | Freq: Once | ORAL | Status: DC

## 2022-05-04 MED ORDER — SCOPOLAMINE 1 MG/3DAYS TD PT72
MEDICATED_PATCH | TRANSDERMAL | Status: AC
  Filled 2022-05-04: qty 1

## 2022-05-04 MED ORDER — LIDOCAINE 2% (20 MG/ML) 5 ML SYRINGE
INTRAMUSCULAR | Status: AC
  Filled 2022-05-04: qty 5

## 2022-05-04 MED ORDER — ACETAMINOPHEN 500 MG PO TABS
ORAL_TABLET | ORAL | Status: AC
  Filled 2022-05-04: qty 2

## 2022-05-04 MED ORDER — PROPOFOL 500 MG/50ML IV EMUL
INTRAVENOUS | Status: DC | PRN
  Administered 2022-05-04: 200 ug/kg/min via INTRAVENOUS

## 2022-05-04 MED ORDER — LIDOCAINE HCL (CARDIAC) PF 100 MG/5ML IV SOSY
PREFILLED_SYRINGE | INTRAVENOUS | Status: DC | PRN
  Administered 2022-05-04: 60 mg via INTRAVENOUS

## 2022-05-04 MED ORDER — PROPOFOL 10 MG/ML IV BOLUS
INTRAVENOUS | Status: AC
  Filled 2022-05-04: qty 20

## 2022-05-04 MED ORDER — MIDAZOLAM HCL 2 MG/2ML IJ SOLN
INTRAMUSCULAR | Status: AC
  Filled 2022-05-04: qty 2

## 2022-05-04 MED ORDER — PROMETHAZINE HCL 25 MG/ML IJ SOLN: 6.2500 mg | INTRAMUSCULAR | Status: DC | PRN

## 2022-05-04 MED ORDER — FENTANYL CITRATE (PF) 100 MCG/2ML IJ SOLN: 25.0000 ug | INTRAMUSCULAR | Status: DC | PRN

## 2022-05-04 MED ORDER — DEXAMETHASONE SODIUM PHOSPHATE 10 MG/ML IJ SOLN
INTRAMUSCULAR | Status: DC | PRN
  Administered 2022-05-04: 5 mg via INTRAVENOUS

## 2022-05-04 MED ORDER — MIDAZOLAM HCL 2 MG/2ML IJ SOLN
2.0000 mg | Freq: Once | INTRAMUSCULAR | Status: AC
  Administered 2022-05-04: 2 mg via INTRAVENOUS

## 2022-05-04 MED ORDER — OXYCODONE HCL 5 MG/5ML PO SOLN: 5.0000 mg | Freq: Once | ORAL | Status: DC | PRN

## 2022-05-04 MED ORDER — PROPOFOL 10 MG/ML IV BOLUS
INTRAVENOUS | Status: DC | PRN
  Administered 2022-05-04: 200 mg via INTRAVENOUS
  Administered 2022-05-04: 50 mg via INTRAVENOUS
  Administered 2022-05-04: 40 mg via INTRAVENOUS

## 2022-05-04 MED ORDER — FENTANYL CITRATE (PF) 100 MCG/2ML IJ SOLN
INTRAMUSCULAR | Status: DC | PRN
  Administered 2022-05-04: 25 ug via INTRAVENOUS
  Administered 2022-05-04: 50 ug via INTRAVENOUS
  Administered 2022-05-04: 25 ug via INTRAVENOUS

## 2022-05-04 MED ORDER — HEMOSTATIC AGENTS (NO CHARGE) OPTIME
TOPICAL | Status: DC | PRN
  Administered 2022-05-04: 1 via TOPICAL

## 2022-05-04 MED ORDER — BUPIVACAINE HCL (PF) 0.25 % IJ SOLN
INTRAMUSCULAR | Status: DC | PRN
  Administered 2022-05-04: 3 mL

## 2022-05-04 MED ORDER — DEXAMETHASONE SODIUM PHOSPHATE 10 MG/ML IJ SOLN
INTRAMUSCULAR | Status: AC
  Filled 2022-05-04: qty 1

## 2022-05-04 MED ORDER — CEFAZOLIN SODIUM-DEXTROSE 2-3 GM-%(50ML) IV SOLR
INTRAVENOUS | Status: DC | PRN
  Administered 2022-05-04: 2 g via INTRAVENOUS

## 2022-05-04 MED ORDER — ONDANSETRON HCL 4 MG/2ML IJ SOLN
INTRAMUSCULAR | Status: AC
  Filled 2022-05-04: qty 2

## 2022-05-04 MED ORDER — LACTATED RINGERS IV SOLN: INTRAVENOUS | Status: DC

## 2022-05-04 MED ORDER — FENTANYL CITRATE (PF) 100 MCG/2ML IJ SOLN
INTRAMUSCULAR | Status: AC
  Filled 2022-05-04: qty 2

## 2022-05-04 MED ORDER — OXYCODONE HCL 5 MG PO TABS: 5.0000 mg | ORAL_TABLET | Freq: Once | ORAL | Status: DC | PRN

## 2022-05-04 MED ORDER — CHLORHEXIDINE GLUCONATE CLOTH 2 % EX PADS: 6.0000 | MEDICATED_PAD | Freq: Once | CUTANEOUS | Status: DC

## 2022-05-04 MED ORDER — ACETAMINOPHEN 500 MG PO TABS
1000.0000 mg | ORAL_TABLET | ORAL | Status: AC
  Administered 2022-05-04: 1000 mg via ORAL

## 2022-05-04 MED ORDER — OXYCODONE HCL 5 MG PO TABS
ORAL_TABLET | ORAL | Status: AC
  Filled 2022-05-04: qty 1

## 2022-05-04 MED ORDER — KETOROLAC TROMETHAMINE 15 MG/ML IJ SOLN
INTRAMUSCULAR | Status: AC
  Filled 2022-05-04: qty 1

## 2022-05-04 MED ORDER — CEFAZOLIN SODIUM-DEXTROSE 2-4 GM/100ML-% IV SOLN
INTRAVENOUS | Status: AC
  Filled 2022-05-04: qty 100

## 2022-05-04 MED ORDER — KETOROLAC TROMETHAMINE 15 MG/ML IJ SOLN
15.0000 mg | INTRAMUSCULAR | Status: AC
  Administered 2022-05-04: 15 mg via INTRAVENOUS

## 2022-05-04 MED ORDER — CEFAZOLIN SODIUM-DEXTROSE 2-4 GM/100ML-% IV SOLN: 2.0000 g | INTRAVENOUS | Status: DC

## 2022-05-04 MED ORDER — EPHEDRINE SULFATE (PRESSORS) 50 MG/ML IJ SOLN
INTRAMUSCULAR | Status: DC | PRN
  Administered 2022-05-04 (×2): 10 mg via INTRAVENOUS

## 2022-05-04 MED ORDER — ONDANSETRON HCL 4 MG/2ML IJ SOLN
INTRAMUSCULAR | Status: DC | PRN
  Administered 2022-05-04: 4 mg via INTRAVENOUS

## 2022-05-04 MED ORDER — LACTATED RINGERS IV SOLN: INTRAVENOUS | Status: DC | PRN

## 2022-05-04 MED ORDER — SCOPOLAMINE 1 MG/3DAYS TD PT72
1.0000 | MEDICATED_PATCH | TRANSDERMAL | Status: DC
  Administered 2022-05-04: 1.5 mg via TRANSDERMAL

## 2022-05-04 MED ORDER — ROPIVACAINE HCL 5 MG/ML IJ SOLN
INTRAMUSCULAR | Status: DC | PRN
  Administered 2022-05-04: 30 mL via PERINEURAL

## 2022-05-04 MED ORDER — TRAMADOL HCL 50 MG PO TABS: 50.0000 mg | ORAL_TABLET | Freq: Four times a day (QID) | ORAL | 0 refills | Status: DC | PRN

## 2022-05-04 MED ORDER — MAGTRACE LYMPHATIC TRACER
INTRAMUSCULAR | Status: DC | PRN
  Administered 2022-05-04: 2 mL via INTRAMUSCULAR

## 2022-05-04 MED ORDER — 0.9 % SODIUM CHLORIDE (POUR BTL) OPTIME
TOPICAL | Status: DC | PRN
  Administered 2022-05-04: 120 mL

## 2022-05-04 MED ORDER — AMISULPRIDE (ANTIEMETIC) 5 MG/2ML IV SOLN: 10.0000 mg | Freq: Once | INTRAVENOUS | Status: DC | PRN

## 2022-05-04 SURGICAL SUPPLY — 60 items
ADH SKN CLS APL DERMABOND .7 (GAUZE/BANDAGES/DRESSINGS) ×1
APL PRP STRL LF DISP 70% ISPRP (MISCELLANEOUS) ×1
APPLIER CLIP 9.375 MED OPEN (MISCELLANEOUS) ×1
APR CLP MED 9.3 20 MLT OPN (MISCELLANEOUS) ×1
BINDER BREAST LRG (GAUZE/BANDAGES/DRESSINGS) IMPLANT
BINDER BREAST MEDIUM (GAUZE/BANDAGES/DRESSINGS) IMPLANT
BINDER BREAST XLRG (GAUZE/BANDAGES/DRESSINGS) IMPLANT
BINDER BREAST XXLRG (GAUZE/BANDAGES/DRESSINGS) IMPLANT
BLADE SURG 15 STRL LF DISP TIS (BLADE) ×1 IMPLANT
BLADE SURG 15 STRL SS (BLADE) ×1
CANISTER SUC SOCK COL 7IN (MISCELLANEOUS) IMPLANT
CANISTER SUCT 1200ML W/VALVE (MISCELLANEOUS) IMPLANT
CHLORAPREP W/TINT 26 (MISCELLANEOUS) ×1 IMPLANT
CLIP APPLIE 9.375 MED OPEN (MISCELLANEOUS) IMPLANT
CLIP TI WIDE RED SMALL 6 (CLIP) ×1 IMPLANT
COVER BACK TABLE 60X90IN (DRAPES) ×1 IMPLANT
COVER MAYO STAND STRL (DRAPES) ×1 IMPLANT
COVER PROBE CYLINDRICAL 5X96 (MISCELLANEOUS) ×1 IMPLANT
DERMABOND ADVANCED .7 DNX12 (GAUZE/BANDAGES/DRESSINGS) ×1 IMPLANT
DRAPE LAPAROSCOPIC ABDOMINAL (DRAPES) ×1 IMPLANT
DRAPE UTILITY XL STRL (DRAPES) ×1 IMPLANT
ELECT COATED BLADE 2.86 ST (ELECTRODE) ×1 IMPLANT
ELECT REM PT RETURN 9FT ADLT (ELECTROSURGICAL) ×1
ELECTRODE REM PT RTRN 9FT ADLT (ELECTROSURGICAL) ×1 IMPLANT
GLOVE BIO SURGEON STRL SZ 6 (GLOVE) IMPLANT
GLOVE BIO SURGEON STRL SZ7 (GLOVE) ×2 IMPLANT
GLOVE BIOGEL PI IND STRL 6 (GLOVE) IMPLANT
GLOVE BIOGEL PI IND STRL 6.5 (GLOVE) IMPLANT
GLOVE BIOGEL PI IND STRL 7.5 (GLOVE) ×1 IMPLANT
GLOVE ECLIPSE 6.5 STRL STRAW (GLOVE) IMPLANT
GOWN STRL REUS W/ TWL LRG LVL3 (GOWN DISPOSABLE) ×2 IMPLANT
GOWN STRL REUS W/TWL LRG LVL3 (GOWN DISPOSABLE) ×3
HEMOSTAT ARISTA ABSORB 3G PWDR (HEMOSTASIS) IMPLANT
KIT MARKER MARGIN INK (KITS) ×1 IMPLANT
NDL HYPO 25X1 1.5 SAFETY (NEEDLE) ×1 IMPLANT
NDL SAFETY ECLIP 18X1.5 (MISCELLANEOUS) IMPLANT
NEEDLE HYPO 25X1 1.5 SAFETY (NEEDLE) ×1 IMPLANT
NS IRRIG 1000ML POUR BTL (IV SOLUTION) IMPLANT
PACK BASIN DAY SURGERY FS (CUSTOM PROCEDURE TRAY) ×1 IMPLANT
PENCIL SMOKE EVACUATOR (MISCELLANEOUS) ×1 IMPLANT
RETRACTOR ONETRAX LX 90X20 (MISCELLANEOUS) IMPLANT
SLEEVE SCD COMPRESS KNEE MED (STOCKING) ×1 IMPLANT
SPIKE FLUID TRANSFER (MISCELLANEOUS) IMPLANT
SPONGE T-LAP 4X18 ~~LOC~~+RFID (SPONGE) ×1 IMPLANT
STRIP CLOSURE SKIN 1/2X4 (GAUZE/BANDAGES/DRESSINGS) ×1 IMPLANT
SUT ETHILON 2 0 FS 18 (SUTURE) IMPLANT
SUT MNCRL AB 4-0 PS2 18 (SUTURE) ×1 IMPLANT
SUT MON AB 5-0 PS2 18 (SUTURE) IMPLANT
SUT SILK 2 0 SH (SUTURE) IMPLANT
SUT VIC AB 2-0 SH 27 (SUTURE) ×2
SUT VIC AB 2-0 SH 27XBRD (SUTURE) ×1 IMPLANT
SUT VIC AB 3-0 SH 27 (SUTURE) ×2
SUT VIC AB 3-0 SH 27X BRD (SUTURE) ×1 IMPLANT
SUT VIC AB 5-0 PS2 18 (SUTURE) IMPLANT
SYR CONTROL 10ML LL (SYRINGE) ×1 IMPLANT
TOWEL GREEN STERILE FF (TOWEL DISPOSABLE) ×1 IMPLANT
TRACER MAGTRACE VIAL (MISCELLANEOUS) IMPLANT
TRAY FAXITRON CT DISP (TRAY / TRAY PROCEDURE) ×1 IMPLANT
TUBE CONNECTING 20X1/4 (TUBING) IMPLANT
YANKAUER SUCT BULB TIP NO VENT (SUCTIONS) IMPLANT

## 2022-05-04 NOTE — H&P (Signed)
62 year old female who is otherwise healthy. She has no prior breast history. She has a family history in her sister at age 69 and her mom at age 23. She has a grandmother with renal cell cancer. They had genetics but I think this was prior to panel testing. She underwent a screening mammogram. She had no mass or discharge. She has C density breast tissue. There is a right breast distortion that looked to be about 1 cm in the upper inner quadrant. By ultrasound this was an 8 x 6 x 8 mm mass. There is no lymphadenopathy. 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 She is here with her husband to discuss her options. She is referred by Dr. Adela Lank  Review of Systems: A complete review of systems was obtained from the patient. I have reviewed this information and discussed as appropriate with the patient. See HPI as well for other ROS.  Review of Systems All other systems reviewed and are negative.   Medical History: Past Medical History: Diagnosis Date Anxiety Arthritis Asthma, unspecified asthma severity, unspecified whether complicated, unspecified whether persistent  There is no problem list on file for this patient.  Past Surgical History: Procedure Laterality Date APPENDECTOMY 1997   Allergies Allergen Reactions Codeine Nausea Oxycodone-Acetaminophen Other (See Comments)  Current Outpatient Medications on File Prior to Visit Medication Sig Dispense Refill meclizine (ANTIVERT) 25 mg tablet 1 tablet as needed Orally every 12 hrs prn for 12 days escitalopram oxalate (LEXAPRO) 10 MG tablet 1 tablet  History reviewed. No pertinent family history.  Social History  Tobacco Use Smoking Status Former Years: 3 Types: Cigarettes Smokeless Tobacco Never Marital status: Married Tobacco Use Smoking status: Former Years: 3 Types: Cigarettes Smokeless tobacco: Never Substance and Sexual  Activity Alcohol use: Not Currently Drug use: Never  Objective:  Vitals: 04/08/22 1039 04/08/22 1040 BP: 110/74 Pulse: 88 Temp: 36.5 C (97.7 F) SpO2: 98% Weight: 78 kg (172 lb) Height: 177.8 cm (5\' 10" ) PainSc: 0-No pain PainLoc: Breast  Body mass index is 24.68 kg/m.  Physical Exam Vitals reviewed. Constitutional: Appearance: Normal appearance. Chest: Breasts: Right: No inverted nipple, mass or nipple discharge. Left: No inverted nipple, mass or nipple discharge. Lymphadenopathy: Upper Body: Right upper body: No supraclavicular or axillary adenopathy. Left upper body: No supraclavicular or axillary adenopathy. Neurological: Mental Status: She is alert.  Assessment and Plan:  Malignant neoplasm of upper-inner quadrant of right breast in female, estrogen receptor positive  right breast radioactive seed guided lumpectomy, right axillary sentinel lymph node biopsy  We discussed genetic testing. This is indicated given her family history especially in first-degree relatives with a young age at their diagnosis. We discussed salivary testing to be done in the office today. We discussed the issues with health and life insurance. She would like to proceed with testing. We discussed results being positive, negative as well as the management of a variant of uncertain significance. We are going to wait until we get these results to schedule her surgery.  We discussed the staging and pathophysiology of breast cancer. We discussed all of the different options for treatment for breast cancer including surgery, chemotherapy, radiation therapy, Herceptin, and antiestrogen therapy.  We discussed a sentinel lymph node biopsy as she does not appear to having lymph node involvement right now. We discussed the performance of that with injection of tracer. We discussed the small risk of skin discoloration associated with the tracer.  We discussed that there is a chance of having a positive  node with a sentinel lymph node biopsy and we will await the permanent pathology to make any other first further decisions in terms of her treatment. We discussed up to a 5% risk lifetime of chronic shoulder pain as well as lymphedema associated with a sentinel lymph node biopsy.  We discussed the options for treatment of the breast cancer which included lumpectomy versus a mastectomy. We discussed the performance of the lumpectomy with radioactive seed placement. We discussed a 5-10% chance of a positive margin requiring reexcision in the operating room. We also discussed that she will likely need radiation therapy if she undergoes lumpectomy. We discussed mastectomy and the postoperative care for that as well. Mastectomy can be followed by reconstruction. The decision for lumpectomy vs mastectomy has no impact on decision for chemotherapy. Most mastectomy patients will not need radiation therapy. We discussed that there is no difference in her survival whether she undergoes lumpectomy with radiation therapy or antiestrogen therapy versus a mastectomy. There is also no real difference between her recurrence in the breast.  We discussed the risks of operation including bleeding, infection, possible reoperation. She understands her further therapy will be based on what her stages at the time of her operation.

## 2022-05-04 NOTE — Op Note (Signed)
Preoperative diagnosis: Clinical stage I right breast cancer Postoperative diagnosis: Same as above Procedure: 1.  Right breast radioactive seed guided lumpectomy 2.  Injection of mag trace for sentinel lymph node identification 3.  Right deep axillary sentinel lymph node biopsy Surgeon: Dr. Serita Grammes Anesthesia: General with a pectoral block Estimated blood loss: 50 cc Specimens: 1.  Right breast tissue marked with paint containing seed and clip 2.  Additional medial and posterior margins marked short superior, long lateral, double deep 3.  Right deep axillary sentinel lymph node Complications: None Drains: None Sponge and needle count was correct completion Disposition to recovery stable addition  Indications:62 year old female who is otherwise healthy. She has no prior breast history and her genetic testing was negative she underwent a screening mammogram. She had no mass or discharge. She has C density breast tissue. There is a right breast distortion that looked to be about 1 cm in the upper inner quadrant. By ultrasound this was an 8 x 6 x 8 mm mass. There is no lymphadenopathy. 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  We discussed options and elected to proceed with lumpectomy and sentinel lymph node biopsy.  Procedure: After informed consent was obtained she is first underwent a pectoral block.  She was given antibiotics.  SCDs were placed.  She was placed under general anesthesia without complication.  She was prepped and draped in the standard sterile surgical fashion.  A surgical timeout was then performed.  I first injected 2 cc of mag trace in the subareolar position and massaged this for a total of 5 minutes.  I then located the seed in the upper inner quadrant.  The seed was fairly close to the skin so I elected to place a small curvilinear incision right over the seed.  I then used cautery to  dissect around with neoprobe guidance and remove the seed and then some surrounding tissue to get a clear margin.  I did a mammogram confirmed removal of the seed and the clip.  I thought I might be close to the medial and posterior margin so I remove these and marked them as above.  The posterior margin is now the muscle.  I then obtained hemostasis.  I placed clips in this cavity.  I then closed this with 2-0 Vicryl, 3-0 Vicryl, 4-0 Monocryl.  Eventually glue and Steri-Strips were also applied.  I then made a small incision below her axillary hairline.  I carried this to the axillary fascia.  I used the probe to identify 1  sentinel lymph node.  This also appeared brown.  There were no other lymph nodes present.  I did have to clip some bleeding vessels during this portion of the procedure as well so that is why there are clips in her axilla.  I then obtained hemostasis.  I closed the axillary fascia with 2-0 Vicryl.  The skin was closed with 3-0 Vicryl for Monocryl.  Glue and Steri-Strips were applied.  She tolerated this well was extubated and transferred to recovery stable.

## 2022-05-04 NOTE — Interval H&P Note (Signed)
History and Physical Interval Note:  05/04/2022 8:11 AM  Rachael Peters  has presented today for surgery, with the diagnosis of RIGHT BREAST CANCER.  The various methods of treatment have been discussed with the patient and family. After consideration of risks, benefits and other options for treatment, the patient has consented to  Procedure(s): RIGHT BREAST LUMPECTOMY WITH RADIOACTIVE SEED AND AXILLARY SENTINEL LYMPH NODE BIOPSY (Right) as a surgical intervention.  The patient's history has been reviewed, patient examined, no change in status, stable for surgery.  I have reviewed the patient's chart and labs.  Questions were answered to the patient's satisfaction.     Rolm Bookbinder

## 2022-05-04 NOTE — Discharge Instructions (Addendum)
[facility] Office Phone Number [phone]  POST OP INSTRUCTIONS Take 400 mg of ibuprofen every 8 hours or 650 mg tylenol every 6 hours for next 72 hours then as needed. Use ice several times daily also.  A prescription for pain medication may be given to you upon discharge.  Take your pain medication as prescribed, if needed.  If narcotic pain medicine is not needed, then you may take acetaminophen (Tylenol), naprosyn (Alleve) or ibuprofen (Advil) as needed. Take your usually prescribed medications unless otherwise directed If you need a refill on your pain medication, please contact your pharmacy.  They will contact our office to request authorization.  Prescriptions will not be filled after 5pm or on week-ends. You should eat very light the first 24 hours after surgery, such as soup, crackers, pudding, etc.  Resume your normal diet the day after surgery. Most patients will experience some swelling and bruising in the breast.  Ice packs and a good support bra will help.  Wear the breast binder provided or a sports bra for 72 hours day and night.  After that wear a sports bra during the day until you return to the office. Swelling and bruising can take several days to resolve.  It is common to experience some constipation if taking pain medication after surgery.  Increasing fluid intake and taking a stool softener will usually help or prevent this problem from occurring.  A mild laxative (Milk of Magnesia or Miralax) should be taken according to package directions if there are no bowel movements after 48 hours. I used skin glue on the incision, you may shower in 24 hours.  The glue will flake off over the next 2-3 weeks.  Any sutures or staples will be removed at the office during your follow-up visit. ACTIVITIES:  You may resume regular daily activities (gradually increasing) beginning the next day.  Wearing a good support bra or sports bra minimizes pain and swelling.  You may have  sexual intercourse when it is comfortable. You may drive when you no longer are taking prescription pain medication, you can comfortably wear a seatbelt, and you can safely maneuver your car and apply brakes. RETURN TO WORK:  ______________________________________________________________________________________ Dennis Bast should see your doctor in the office for a follow-up appointment approximately two weeks after your surgery.  Your doctor's nurse will typically make your follow-up appointment when she calls you with your pathology report.  Expect your pathology report 3-4 business days after your surgery.  You may call to check if you do not hear from [place] after three days. OTHER INSTRUCTIONS: _______________________________________________________________________________________________ _____________________________________________________________________________________________________________________________________ _____________________________________________________________________________________________________________________________________ _____________________________________________________________________________________________________________________________________  WHEN TO CALL DR WAKEFIELD: Fever over 101.0 Nausea and/or vomiting. Extreme swelling or bruising. Continued bleeding from incision. Increased pain, redness, or drainage from the incision.  The clinic staff is available to answer your questions during regular business hours.  Please don't hesitate to call and ask to speak to one of the nurses for clinical concerns.  If you have a medical emergency, go to the nearest emergency room or call 911.  A surgeon from [facility] is always on call at the hospital.  For further questions, please visit [email or web address] May take Tylenol after 1:30pm, if needed. May take NSAIDs (ibuprofen/motrin) after 1:30 pm, if needed.    Post Anesthesia Home Care  Instructions  Activity: Get plenty of rest for the remainder of the day. A responsible individual must stay with you for 24 hours following the procedure.  For the next 24  hours, DO NOT: -Drive a car -Paediatric nurse -Drink alcoholic beverages -Take any medication unless instructed by your physician -Make any legal decisions or sign important papers.  Meals: Start with liquid foods such as gelatin or soup. Progress to regular foods as tolerated. Avoid greasy, spicy, heavy foods. If nausea and/or vomiting occur, drink only clear liquids until the nausea and/or vomiting subsides. Call your physician if vomiting continues.  Special Instructions/Symptoms: Your throat may feel dry or sore from the anesthesia or the breathing tube placed in your throat during surgery. If this causes discomfort, gargle with warm salt water. The discomfort should disappear within 24 hours.  If you had a scopolamine patch placed behind your ear for the management of post- operative nausea and/or vomiting:  1. The medication in the patch is effective for 72 hours, after which it should be removed.  Wrap patch in a tissue and discard in the trash. Wash hands thoroughly with soap and water. 2. You may remove the patch earlier than 72 hours if you experience unpleasant side effects which may include dry mouth, dizziness or visual disturbances. 3. Avoid touching the patch. Wash your hands with soap and water after contact with the patch.   Regional Anesthesia Blocks  1. Numbness or the inability to move the "blocked" extremity may last from 3-48 hours after placement. The length of time depends on the medication injected and your individual response to the medication. If the numbness is not going away after 48 hours, call your surgeon.  2. The extremity that is blocked will need to be protected until the numbness is gone and the  Strength has returned. Because you cannot feel it, you will need to take extra care to  avoid injury. Because it may be weak, you may have difficulty moving it or using it. You may not know what position it is in without looking at it while the block is in effect.  3. For blocks in the legs and feet, returning to weight bearing and walking needs to be done carefully. You will need to wait until the numbness is entirely gone and the strength has returned. You should be able to move your leg and foot normally before you try and bear weight or walk. You will need someone to be with you when you first try to ensure you do not fall and possibly risk injury.  4. Bruising and tenderness at the needle site are common side effects and will resolve in a few days.  5. Persistent numbness or new problems with movement should be communicated to the surgeon or the [facility] [phone] [place] [phone]).

## 2022-05-04 NOTE — Anesthesia Postprocedure Evaluation (Signed)
Anesthesia Post Note  Patient: Rachael Peters  Procedure(s) Performed: RIGHT BREAST LUMPECTOMY WITH RADIOACTIVE SEED AND AXILLARY SENTINEL LYMPH NODE BIOPSY (Right: Breast)     Patient location during evaluation: PACU Anesthesia Type: Regional and General Level of consciousness: awake Pain management: pain level controlled Vital Signs Assessment: post-procedure vital signs reviewed and stable Respiratory status: spontaneous breathing, nonlabored ventilation and respiratory function stable Cardiovascular status: blood pressure returned to baseline and stable Postop Assessment: no apparent nausea or vomiting Anesthetic complications: no   No notable events documented.  Last Vitals:  Vitals:   05/04/22 1010 05/04/22 1039  BP:  108/68  Pulse: 70 74  Resp:  16  Temp:  (!) 36.3 C  SpO2: 98% 94%    Last Pain:  Vitals:   05/04/22 1021  TempSrc:   PainSc: 5                  Kahari Critzer P Calista Crain

## 2022-05-04 NOTE — Anesthesia Procedure Notes (Signed)
Anesthesia Regional Block: Pectoralis block   Pre-Anesthetic Checklist: , timeout performed,  Correct Patient, Correct Site, Correct Laterality,  Correct Procedure, Correct Position, site marked,  Risks and benefits discussed,  Surgical consent,  Pre-op evaluation,  At surgeon's request and post-op pain management  Laterality: Right  Prep: chloraprep       Needles:  Injection technique: Single-shot  Needle Type: Echogenic Stimulator Needle     Needle Length: 10cm  Needle Gauge: 20     Additional Needles:   Procedures:,,,, ultrasound used (permanent image in chart),,    Narrative:  Start time: 05/04/2022 8:05 AM End time: 05/04/2022 8:15 AM Injection made incrementally with aspirations every 5 mL.  Performed by: Personally  Anesthesiologist: Murvin Natal, MD  Additional Notes: Functioning IV was confirmed and monitors were applied.  A timeout was performed. Sterile prep, hand hygiene and sterile gloves were used. A 12mm 20ga Bbraun echogenic stimulator needle was used. Negative aspiration and negative test dose prior to incremental administration of local anesthetic. The patient tolerated the procedure well.  Ultrasound guidance: relevent anatomy identified, needle position confirmed, local anesthetic spread visualized around nerve(s), vascular puncture avoided.  Image printed for medical record.

## 2022-05-04 NOTE — Anesthesia Procedure Notes (Signed)
Procedure Name: LMA Insertion Date/Time: 05/04/2022 8:49 AM  Performed by: Verita Lamb, CRNAPre-anesthesia Checklist: Patient identified, Emergency Drugs available, Suction available and Patient being monitored Patient Re-evaluated:Patient Re-evaluated prior to induction Oxygen Delivery Method: Circle system utilized Preoxygenation: Pre-oxygenation with 100% oxygen Induction Type: IV induction Ventilation: Mask ventilation without difficulty LMA: LMA inserted LMA Size: 4.0 Number of attempts: 1 Airway Equipment and Method: Bite block Placement Confirmation: positive ETCO2, CO2 detector and breath sounds checked- equal and bilateral Tube secured with: Tape Dental Injury: Teeth and Oropharynx as per pre-operative assessment

## 2022-05-04 NOTE — Progress Notes (Signed)
Assisted Dr. Ellender with right, pectoralis, ultrasound guided block. Side rails up, monitors on throughout procedure. See vital signs in flow sheet. Tolerated Procedure well. ?

## 2022-05-04 NOTE — Anesthesia Preprocedure Evaluation (Addendum)
Anesthesia Evaluation  Patient identified by MRN, date of birth, ID band Patient awake    Reviewed: Allergy & Precautions, NPO status , Patient's Chart, lab work & pertinent test results  History of Anesthesia Complications (+) PONV and history of anesthetic complications  Airway Mallampati: II  TM Distance: >3 FB Neck ROM: Full    Dental no notable dental hx.    Pulmonary asthma , COPD,  COPD inhaler, former smoker   Pulmonary exam normal        Cardiovascular negative cardio ROS Normal cardiovascular exam     Neuro/Psych  PSYCHIATRIC DISORDERS Anxiety Depression    negative neurological ROS     GI/Hepatic negative GI ROS, Neg liver ROS,,,  Endo/Other  negative endocrine ROS    Renal/GU Renal disease     Musculoskeletal negative musculoskeletal ROS (+)    Abdominal   Peds  Hematology negative hematology ROS (+)   Anesthesia Other Findings RIGHT BREAST CANCER  Reproductive/Obstetrics                             Anesthesia Physical Anesthesia Plan  ASA: 2  Anesthesia Plan: General and Regional   Post-op Pain Management: Regional block*   Induction: Intravenous  PONV Risk Score and Plan: 4 or greater and Ondansetron, Dexamethasone, Midazolam, Scopolamine patch - Pre-op and Treatment may vary due to age or medical condition  Airway Management Planned: LMA  Additional Equipment:   Intra-op Plan:   Post-operative Plan: Extubation in OR  Informed Consent: I have reviewed the patients History and Physical, chart, labs and discussed the procedure including the risks, benefits and alternatives for the proposed anesthesia with the patient or authorized representative who has indicated his/her understanding and acceptance.     Dental advisory given  Plan Discussed with: CRNA  Anesthesia Plan Comments:        Anesthesia Quick Evaluation

## 2022-05-04 NOTE — Transfer of Care (Signed)
Immediate Anesthesia Transfer of Care Note  Patient: Rachael Peters  Procedure(s) Performed: RIGHT BREAST LUMPECTOMY WITH RADIOACTIVE SEED AND AXILLARY SENTINEL LYMPH NODE BIOPSY (Right: Breast)  Patient Location: PACU  Anesthesia Type:General  Level of Consciousness: awake, alert , and oriented  Airway & Oxygen Therapy: Patient Spontanous Breathing and Patient connected to face mask oxygen  Post-op Assessment: Report given to RN and Post -op Vital signs reviewed and stable  Post vital signs: Reviewed and stable  Last Vitals:  Vitals Value Taken Time  BP    Temp    Pulse 93 05/04/22 0950  Resp    SpO2 93 % 05/04/22 0950  Vitals shown include unvalidated device data.  Last Pain:  Vitals:   05/04/22 0715  TempSrc: Oral  PainSc: 0-No pain      Patients Stated Pain Goal: 5 ([id number])  Complications: No notable events documented.

## 2022-05-04 NOTE — Interval H&P Note (Signed)
History and Physical Interval Note:  05/04/2022 7:26 AM  Rachael Peters  has presented today for surgery, with the diagnosis of RIGHT BREAST CANCER.  The various methods of treatment have been discussed with the patient and family. After consideration of risks, benefits and other options for treatment, the patient has consented to  Procedure(s): RIGHT BREAST LUMPECTOMY WITH RADIOACTIVE SEED AND AXILLARY SENTINEL LYMPH NODE BIOPSY (Right) as a surgical intervention.  The patient's history has been reviewed, patient examined, no change in status, stable for surgery.  I have reviewed the patient's chart and labs.  Questions were answered to the patient's satisfaction.     Rolm Bookbinder

## 2022-05-05 ENCOUNTER — Encounter (HOSPITAL_BASED_OUTPATIENT_CLINIC_OR_DEPARTMENT_OTHER): Payer: Self-pay | Admitting: General Surgery

## 2022-05-06 LAB — SURGICAL PATHOLOGY

## 2022-05-10 ENCOUNTER — Encounter: Payer: Self-pay | Admitting: *Deleted

## 2022-05-10 DIAGNOSIS — C50211 Malignant neoplasm of upper-inner quadrant of right female breast: Secondary | ICD-10-CM

## 2022-05-12 NOTE — Progress Notes (Signed)
Patient Care Team: [name], FNP as PCP - General (Internal Medicine) Donnelly Angelica, RN as Oncology Nurse Navigator Pershing Proud, RN as Oncology Nurse Navigator Serena Croissant, MD as Consulting Physician (Hematology and Oncology) Emelia Loron, MD as Consulting Physician (General Surgery) Lonie Peak, MD as Attending Physician (Radiation Oncology)  DIAGNOSIS:  Encounter Diagnosis  Name Primary?   Malignant neoplasm of upper-inner quadrant of right breast in female, estrogen receptor positive Yes    SUMMARY OF ONCOLOGIC HISTORY: Oncology History  Malignant neoplasm of upper-inner quadrant of right breast in female, estrogen receptor positive  03/25/2022 Initial Diagnosis   Screening mammogram detected right breast mass 0.8 cm, axilla negative, biopsy: Grade 1 IDC ER 100%, PR 85%, Ki67 5%, HER2 0   04/13/2022 Cancer Staging   Staging form: Breast, AJCC 8th Edition - Clinical: Stage IA (cT1b, cN0, cM0, G1, ER+, PR+, HER2-) - Signed by Serena Croissant, MD on 04/13/2022 Stage prefix: Initial diagnosis Histologic grading system: 3 grade system   04/25/2022 Surgery   Right lumpectomy: 0.7 cm grade 1 IDC, margins negative, 0/2 lymph nodes negative, ER 100%, PR 85%, Ki-67 5%, HER2 0 negative     CHIEF COMPLIANT: Follow-up after surgery  INTERVAL HISTORY: Rachael Peters is a 62 y.o. female is here because of recent diagnosis of right breast cancer. She presents to the clinic for a follow-up. He reports surgery went well. Has some pain under the right axilla, but she used ice for relief. She has no concerns or issues to report to the clinic today.    ALLERGIES:  is allergic to codeine.  MEDICATIONS:  Current Outpatient Medications  Medication Sig Dispense Refill   albuterol (VENTOLIN HFA) 108 (90 Base) MCG/ACT inhaler albuterol sulfate HFA 90 mcg/actuation aerosol inhaler  1 2 INHALATIONS EVERY 4 6 HOURS AS NEEDED FOR SHORTNESS OF BREATH.      budesonide-formoterol (SYMBICORT) 80-4.5 MCG/ACT inhaler TAKE 2 PUFFS BY MOUTH TWICE A DAY     Cyanocobalamin 3000 MCG SUBL Place 1 tablet under the tongue daily.     escitalopram (LEXAPRO) 10 MG tablet 1 tablet     meclizine (ANTIVERT) 25 MG tablet Take 25 mg by mouth 2 (two) times daily as needed for dizziness.     No current facility-administered medications for this visit.    PHYSICAL EXAMINATION: ECOG PERFORMANCE STATUS: 1 - Symptomatic but completely ambulatory  Vitals:   05/21/22 1009  BP: 130/65  Pulse: 66  Resp: 18  Temp: 97.8 F (36.6 C)  SpO2: 100%   Filed Weights   05/21/22 1009  Weight: 174 lb 3.2 oz (79 kg)      LABORATORY DATA:  I have reviewed the data as listed    Latest Ref Rng & Units 07/10/2020   12:55 PM 09/26/2018   11:22 AM 04/21/2018    5:37 AM  CMP  Glucose 70 - 99 mg/dL 78  89  562   BUN 6 - 23 mg/dL 10  13  7    Creatinine 0.40 - 1.20 mg/dL 5.63  8.93  7.34   Sodium 135 - 145 mEq/L 135  138  137   Potassium 3.5 - 5.1 mEq/L 3.3  4.1  3.9   Chloride 96 - 112 mEq/L 99  97  105   CO2 19 - 32 mEq/L 30  27  26    Calcium 8.4 - 10.5 mg/dL 8.8  9.1  8.2   Total Protein 6.0 - 8.5 g/dL  7.1    Total  Bilirubin 0.0 - 1.2 mg/dL  0.4    Alkaline Phos 39 - 117 IU/L  76    AST 0 - 40 IU/L  19    ALT 0 - 32 IU/L  15      Lab Results  Component Value Date   WBC 5.4 09/26/2018   HGB 13.6 09/26/2018   HCT 43.2 09/26/2018   MCV 92 09/26/2018   PLT 177 09/26/2018   NEUTROABS 3.0 02/24/2017    ASSESSMENT & PLAN:  Malignant neoplasm of upper-inner quadrant of right breast in female, estrogen receptor positive ([facility]) 04/25/2022:Right lumpectomy: 0.7 cm grade 1 IDC, margins negative, 0/2 lymph nodes negative, ER 100%, PR 85%, Ki-67 5%, HER2 0 negative  Pathology counseling: I discussed the final pathology report of the patient provided  a copy of this report. I discussed the margins as well as lymph node surgeries. We also discussed the final staging along with  previously performed ER/PR and HER-2/neu testing.  Treatment plan: Adjuvant radiation therapy followed by Adjuvant antiestrogen therapy -------------------------------------------------------------------------------------------------------------- Patient does not have any pain or discomfort.  She has done extremely well from surgery standpoint. Return to clinic after radiation is complete to start antiestrogen therapy.    No orders of the defined types were placed in this encounter.  The patient has a good understanding of the overall plan. she agrees with it. she will call with any problems that may develop before the next visit here. Total time spent: 30 mins including face to face time and time spent for planning, charting and co-ordination of care   Tamsen MeekViinay K Aldrich Lloyd, MD 05/21/22    I Janan Ridgeeritra, [place] am acting as a Neurosurgeonscribe for [employer] Service[employer] CompanyDr.Aleshka Corney  I have reviewed the above documentation for accuracy and completeness, and I agree with the above.

## 2022-05-13 ENCOUNTER — Encounter: Payer: Self-pay | Admitting: *Deleted

## 2022-05-18 NOTE — Progress Notes (Incomplete)
Location of Breast Cancer: Malignant neoplasm of upper-inner quadrant of right breast in female, estrogen receptor positive   Histology per Pathology Report:  05-04-22 FINAL MICROSCOPIC DIAGNOSIS:  A. BREAST, RIGHT, LUMPECTOMY: - Invasive ductal carcinoma, 0.7 cm, grade 1 - Resection margins are negative for carcinoma - Biopsy site changes - Fibrocystic change with usual ductal hyperplasia - See oncology table  B. BREAST, RIGHT POSTERIOR MARGIN, EXCISION: - Mild fibrocystic change, negative for carcinoma  C. BREAST, RIGHT MEDIAL MARGIN, EXCISION: - Benign breast parenchyma, negative for carcinoma  D. LYMPH NODE, RIGHT AXILLA, SENTINEL, EXCISION: - Lymph node, negative for carcinoma (0/1)  E. LYMPH NODE, RIGHT AXILLA, SENTINEL, EXCISION: - Lymph node, negative for carcinoma (0/1)  ONCOLOGY TABLE:  Procedure: Lumpectomy Specimen Laterality: Right Histologic Type: Invasive ductal carcinoma Histologic Grade:      Glandular (Acinar)/Tubular Differentiation: 2      Nuclear Pleomorphism: 2      Mitotic Rate: 1      Overall Grade: 1 Tumor Size: 0.7 cm Ductal Carcinoma In Situ: Not identified Treatment Effect in the Breast: No known presurgical therapy Margins: All margins negative for invasive carcinoma      Distance from Closest Margin (mm): Cannot be determined Regional Lymph Nodes:      Number of Lymph Nodes Examined: 2      Number of Sentinel Nodes Examined: 2      Number of Lymph Nodes with Macrometastases (>2 mm): 0      Number of Lymph Nodes with Micrometastases: 0      Number of Lymph Nodes with Isolated Tumor Cells (=0.2 mm or =200 cells): 0      Size of Largest Metastatic Deposit (mm): Not applicable      Extranodal Extension: Not applicable Distant Metastasis:      Distant Site(s) Involved: Not applicable Breast Biomarker Testing Performed on Previous Biopsy:      Testing Performed on Case Number: [id number]            Estrogen Receptor: 100%, positive,  strong staining intensity            Progesterone Receptor: 85%, positive, moderate-strong staining intensity            HER2: Negative (0)            Ki-67: 5% Pathologic Stage Classification (pTNM, AJCC 8th Edition): pT1b, pN0 Representative Tumor Block: A2 Comment(s): None  03-25-22   Receptor Status: ER(100%), PR (85%), Her2-neu (neg), Ki-67(5%)  Did patient present with symptoms (if so, please note symptoms) or was this found on screening mammography?: screening mammogram  Past/Anticipated interventions by surgeon, if any: 05-04-22 Procedure: 1.  Right breast radioactive seed guided lumpectomy 2.  Injection of mag trace for sentinel lymph node identification 3.  Right deep axillary sentinel lymph node biopsy Surgeon: Dr. Serita Grammes  62 year old female who is otherwise healthy. She has no prior breast history. She has a family history in her sister at age 71 and her mom at age 6. She has a grandmother with renal cell cancer. They had genetics but I think this was prior to panel testing.   Assessment and Plan:  Malignant neoplasm of upper-inner quadrant of right breast in female, estrogen receptor positive  right breast radioactive seed guided lumpectomy, right axillary sentinel lymph node biopsy  We discussed genetic testing. This is indicated given her family history especially in first-degree relatives with a young age at their diagnosis. We discussed salivary testing to be done in the office today. We  discussed the issues with health and life insurance. She would like to proceed with testing. We discussed results being positive, negative as well as the management of a variant of uncertain significance. We are going to wait until we get these results to schedule her surgery.  We discussed the staging and pathophysiology of breast cancer. We discussed all of the different options for treatment for breast cancer including surgery, chemotherapy, radiation therapy, Herceptin,  and antiestrogen therapy.  We discussed a sentinel lymph node biopsy as she does not appear to having lymph node involvement right now. We discussed the performance of that with injection of tracer. We discussed the small risk of skin discoloration associated with the tracer. We discussed that there is a chance of having a positive node with a sentinel lymph node biopsy and we will await the permanent pathology to make any other first further decisions in terms of her treatment. We discussed up to a 5% risk lifetime of chronic shoulder pain as well as lymphedema associated with a sentinel lymph node biopsy.  We discussed the options for treatment of the breast cancer which included lumpectomy versus a mastectomy. We discussed the performance of the lumpectomy with radioactive seed placement. We discussed a 5-10% chance of a positive margin requiring reexcision in the operating room. We also discussed that she will likely need radiation therapy if she undergoes lumpectomy. We discussed mastectomy and the postoperative care for that as well. Mastectomy can be followed by reconstruction. The decision for lumpectomy vs mastectomy has no impact on decision for chemotherapy. Most mastectomy patients will not need radiation therapy. We discussed that there is no difference in her survival whether she undergoes lumpectomy with radiation therapy or antiestrogen therapy versus a mastectomy. There is also no real difference between her recurrence in the breast.   Past/Anticipated interventions by medical oncology, if any:  04-13-22 Oncology History  Malignant neoplasm of upper-inner quadrant of right breast in female, estrogen receptor positive ([place])  03/25/2022 Initial Diagnosis    Screening mammogram detected right breast mass 0.8 cm, axilla negative, biopsy: Grade 1 IDC ER 100%, PR 85%, Ki67 5%, HER2 0    04/13/2022 Cancer Staging    Staging form: Breast, AJCC 8th Edition - Clinical: Stage IA (cT1b, cN0, cM0, G1,  ER+, PR+, HER2-) - Signed by Nicholas Lose, MD on 04/13/2022 Stage prefix: Initial diagnosis Histologic grading system: 3 grade system     ASSESSMENT AND PLAN:  Malignant neoplasm of upper-inner quadrant of right breast in female, estrogen receptor positive ([place]) 03/25/2022:Screening mammogram detected right breast mass 0.8 cm, axilla negative, biopsy: Grade 1 IDC ER 100%, PR 85%, Ki67 5%, HER2 0   Pathology and radiology counseling:Discussed with the patient, the details of pathology including the type of breast cancer,the clinical staging, the significance of ER, PR and HER-2/neu receptors and the implications for treatment. After reviewing the pathology in detail, we proceeded to discuss the different treatment options between surgery, radiation, chemotherapy, antiestrogen therapies.   Recommendations: 1. Breast conserving surgery followed by 2. Oncotype DX testing if the final tumor size is greater than 1 cm 3. Adjuvant radiation therapy followed by 4. Adjuvant antiestrogen therapy   Oncotype counseling: I discussed Oncotype DX test. I explained to the patient that this is a 21 gene panel to evaluate patient tumors DNA to calculate recurrence score. This would help determine whether patient has high risk or low risk breast cancer. She understands that if her tumor was found to be high risk, she  would benefit from systemic chemotherapy. If low risk, no need of chemotherapy.   Return to clinic after surgery to discuss final pathology report and then determine if Oncotype DX testing will need to be sent.   Lymphedema issues, if any:  {:18581} {t:21944}   Pain issues, if any:  {:18581} {PAIN DESCRIPTION:[id number]}  SAFETY ISSUES: Prior radiation? {:18581} Pacemaker/ICD? {:18581} Possible current pregnancy?{:18581} Is the patient on methotrexate? {:18581}  Current Complaints / other details:  ***

## 2022-05-20 ENCOUNTER — Encounter (HOSPITAL_COMMUNITY): Payer: Self-pay

## 2022-05-21 ENCOUNTER — Inpatient Hospital Stay: Payer: BC Managed Care – PPO | Attending: Hematology and Oncology | Admitting: Hematology and Oncology

## 2022-05-21 VITALS — BP 130/65 | HR 66 | Temp 97.8°F | Resp 18 | Ht 70.0 in | Wt 174.2 lb

## 2022-05-21 DIAGNOSIS — Z170 Estrogen receptor positive status [ER+]: Secondary | ICD-10-CM | POA: Diagnosis not present

## 2022-05-21 DIAGNOSIS — C50211 Malignant neoplasm of upper-inner quadrant of right female breast: Secondary | ICD-10-CM | POA: Diagnosis present

## 2022-05-21 NOTE — Assessment & Plan Note (Signed)
04/25/2022:Right lumpectomy: 0.7 cm grade 1 IDC, margins negative, 0/2 lymph nodes negative, ER 100%, PR 85%, Ki-67 5%, HER2 0 negative  Pathology counseling: I discussed the final pathology report of the patient provided  a copy of this report. I discussed the margins as well as lymph node surgeries. We also discussed the final staging along with previously performed ER/PR and HER-2/neu testing.  Treatment plan: Oncotype DX testing if the final tumor size is greater than 1 cm Adjuvant radiation therapy followed by Adjuvant antiestrogen therapy -------------------------------------------------------------------------------------------------------------- Return to clinic based upon Oncotype DX test result

## 2022-05-25 ENCOUNTER — Ambulatory Visit: Payer: BC Managed Care – PPO

## 2022-05-28 ENCOUNTER — Encounter: Payer: Self-pay | Admitting: *Deleted

## 2022-05-31 NOTE — Progress Notes (Signed)
Radiation Oncology         [phone] ________________________________  Name: Rachael Peters MRN: [id number]  Date: 06/01/2022  DOB: 24-May-1960  Follow-Up Visit Note  Outpatient  CC: [name], FNP  Serena Croissant, MD  Diagnosis:      ICD-10-CM   1. Malignant neoplasm of upper-inner quadrant of right breast in female, estrogen receptor positive  C50.211    Z17.0       Stage IA (cT1b, cN0, cM0) Right Breast UIQ, Invasive Ductal Carcinoma, ER+ / PR+ / Her2-, Grade 1: s/p lumpectomy with clear margins and negative nodes   Cancer Staging  Malignant neoplasm of upper-inner quadrant of right breast in female, estrogen receptor positive Staging form: Breast, AJCC 8th Edition - Clinical: Stage IA (cT1b, cN0, cM0, G1, ER+, PR+, HER2-) - Signed by Serena Croissant, MD on 04/13/2022  CHIEF COMPLAINT: Here to discuss management of right breast cancer  Narrative:  The patient returns today for follow-up.     Since her consultation date of 04/13/22, she opted to proceed with a right breast lumpectomy with SLN biopsies on 05/04/22 under the care of Dr. Dwain Sarna. Pathology from the procedure revealed: tumor size of 7 mm; histology of grade 1 invasive ductal carcinoma; all margins negative for invasive carcinoma; margin status to invasive disease not determined; nodal status of 2/2 right axillary sentinel lymph node excisions negative for carcinoma;  ER status: 100% positive with strong staining intensity; PR status 85% positive with moderate-strong staining intensity; Proliferation marker Ki67 at 5%; Her2 status negative; Grade 1.  The patient has met with Dr. Pamelia Hoit and she is agreeable to proceed with antiestrogen therapy following XRT.   Of note: The patient presented for a chest CT on 04/28/22 (for follow-up of a pulmonary nodule) which showed stability of a RLL nodule measuring 6 mm.   Symptomatically, the patient reports: some soreness under her right axilla since the surgery.  She is taking Tylenol as needed for pain. The pain has improved since surgery. She was last evaluated by Dr. Dwain Sarna last week who stated she was healing well from surgery. She denies any arm or breast swelling but axillary swelling waxes and wanes.  She is still having menopausal symptoms including hot flashes.        ALLERGIES:  is allergic to codeine.  Meds: Current Outpatient Medications  Medication Sig Dispense Refill   albuterol (VENTOLIN HFA) 108 (90 Base) MCG/ACT inhaler albuterol sulfate HFA 90 mcg/actuation aerosol inhaler  1 2 INHALATIONS EVERY 4 6 HOURS AS NEEDED FOR SHORTNESS OF BREATH.     budesonide-formoterol (SYMBICORT) 80-4.5 MCG/ACT inhaler TAKE 2 PUFFS BY MOUTH TWICE A DAY     Cyanocobalamin 3000 MCG SUBL Place 1 tablet under the tongue daily.     escitalopram (LEXAPRO) 10 MG tablet 1 tablet     No current facility-administered medications for this encounter.    Physical Findings:  height is 5\' 10"  (1.778 m) and weight is 175 lb 2 oz (79.4 kg). Her temporal temperature is 96.3 F (35.7 C) (abnormal). Her blood pressure is 124/89 and her pulse is 61. Her respiration is 18 and oxygen saturation is 100%. .     General: Alert and oriented, in no acute distress HEENT: Head is normocephalic. Extraocular movements are intact. Oropharynx is clear. Neck: Neck is supple, no palpable cervical or supraclavicular lymphadenopathy. Heart: Regular in rate and rhythm with no murmurs, rubs, or gallops. Chest: Clear to auscultation bilaterally, with no rhonchi, wheezes, or rales. Abdomen:  Soft, nontender, nondistended, with no rigidity or guarding. Extremities: No cyanosis or edema. Lymphatics: see Neck Exam Musculoskeletal: symmetric strength and muscle tone throughout. Neurologic: No obvious focalities. Speech is fluent.  Psychiatric: Judgment and insight are intact. Affect is appropriate. Breast exam reveals: Surgical incisions from Rt lumpectomy and SLN are well approximated. No  erythema, warmth, or drainage. No other masses or abnormalities in either breast.   Lab Findings: Lab Results  Component Value Date   WBC 5.4 09/26/2018   HGB 13.6 09/26/2018   HCT 43.2 09/26/2018   MCV 92 09/26/2018   PLT 177 09/26/2018    Radiographic Findings: MM Breast Surgical Specimen  Result Date: 05/04/2022 CLINICAL DATA:  Status post right lumpectomy. EXAM: SPECIMEN RADIOGRAPH OF THE RIGHT BREAST COMPARISON:  Previous exam(s). FINDINGS: Status post excision of the right breast. The radioactive seed and biopsy marker clip are present and completely intact. IMPRESSION: Specimen radiograph of the right breast. Electronically Signed   By: Annia Belt M.D.   On: 05/04/2022 09:12  [place] RT RADIOACTIVE SEED LOC  Result Date: 05/03/2022 CLINICAL DATA:  62 year old female for radioactive seed localization of RIGHT breast cancer EXAM: ULTRASOUND GUIDED RADIOACTIVE SEED LOCALIZATION OF THE RIGHT BREAST DIAGNOSTIC RIGHT MAMMOGRAM POST ULTRASOUND-GUIDED RADIOACTIVE SEED PLACEMENT COMPARISON:  Previous exam(s). FINDINGS: Patient presents for radioactive seed localization prior to RIGHT lumpectomy. I met with the patient and we discussed the procedure of seed localization including benefits and alternatives. We discussed the high likelihood of a successful procedure. We discussed the risks of the procedure including infection, bleeding, tissue injury and further surgery. We discussed the low dose of radioactivity involved in the procedure. Informed, written consent was given. The usual time-out protocol was performed immediately prior to the procedure. Using ultrasound guidance, sterile technique, 1% lidocaine and an I-125 radioactive seed, the 0.8 cm mass/biopsy-proven malignancy at the 2 o'clock position of the RIGHT breast 5 cm from nipple was localized using a LATERAL approach. Follow-up survey of the patient confirms presence of the radioactive seed. Order number of I-125 seed:  [id number]. Total  activity:  0.241 millicuries.  Reference Date: 04/14/2022. Mammographic images were obtained following ultrasound-guided radioactive seed placement. The follow-up mammogram images confirm the seed in the expected location and were marked for Dr. Dwain Sarna. The seed is located at the mass and adjacent to the COIL biopsy clip. The patient tolerated the procedure well and was released from the [facility]. She was given instructions regarding seed removal. IMPRESSION: Ultrasound-guided radioactive seed localization of the RIGHT breast. Appropriate location of the radioactive seed. Final Assessment: Post Procedure Mammograms for Seed Placement Electronically Signed   By: Harmon Pier M.D.   On: 05/03/2022 13:57  MM CLIP PLACEMENT RIGHT  Result Date: 05/03/2022 CLINICAL DATA:  62 year old female for radioactive seed localization of RIGHT breast cancer EXAM: ULTRASOUND GUIDED RADIOACTIVE SEED LOCALIZATION OF THE RIGHT BREAST DIAGNOSTIC RIGHT MAMMOGRAM POST ULTRASOUND-GUIDED RADIOACTIVE SEED PLACEMENT COMPARISON:  Previous exam(s). FINDINGS: Patient presents for radioactive seed localization prior to RIGHT lumpectomy. I met with the patient and we discussed the procedure of seed localization including benefits and alternatives. We discussed the high likelihood of a successful procedure. We discussed the risks of the procedure including infection, bleeding, tissue injury and further surgery. We discussed the low dose of radioactivity involved in the procedure. Informed, written consent was given. The usual time-out protocol was performed immediately prior to the procedure. Using ultrasound guidance, sterile technique, 1% lidocaine and an I-125 radioactive seed, the 0.8 cm mass/biopsy-proven malignancy  at the 2 o'clock position of the RIGHT breast 5 cm from nipple was localized using a LATERAL approach. Follow-up survey of the patient confirms presence of the radioactive seed. Order number of I-125 seed:  [id number].  Total activity:  0.241 millicuries.  Reference Date: 04/14/2022. Mammographic images were obtained following ultrasound-guided radioactive seed placement. The follow-up mammogram images confirm the seed in the expected location and were marked for Dr. Dwain Sarna. The seed is located at the mass and adjacent to the COIL biopsy clip. The patient tolerated the procedure well and was released from the Breast Center. She was given instructions regarding seed removal. IMPRESSION: Ultrasound-guided radioactive seed localization of the RIGHT breast. Appropriate location of the radioactive seed. Final Assessment: Post Procedure Mammograms for Seed Placement Electronically Signed   By: Harmon Pier M.D.   On: 05/03/2022 13:57   Impression/Plan: Right breast cancer  Patient is continuing to heal well from her lumpectomy. We discussed adjuvant radiotherapy today.  I recommend radiation therapy in order to decrease her risk of recurrence by 2/3rds.  I reviewed the logistics, benefits, risks, and potential side effects of this treatment in detail. Risks may include but not necessary be limited to acute and late injury tissue in the radiation fields such as skin irritation (change in color/pigmentation, itching, dryness, pain, peeling). She may experience fatigue. We also discussed possible risk of long term cosmetic changes or scar tissue. There is also a smaller risk for lung toxicity, cardiac toxicity, brachial plexopathy, lymphedema, musculoskeletal changes, rib fragility or induction of a second malignancy, late chronic non-healing soft tissue wound.    The patient asked good questions which I answered to her satisfaction. She is enthusiastic about proceeding with treatment. A consent form has been signed and placed in her chart.  The patient will receive 40.05 Gy in 15 fractions to the right breast.  This will be followed by a boost. Treating planning today.   On date of service, in total, I spent 40 minutes on this  encounter. Patient was seen in person.  _____________________________________   Joyice Faster, PA   Lonie Peak, MD  This document serves as a record of services personally performed by Lonie Peak, MD. It was created on her behalf by Neena Rhymes, a trained medical scribe. The creation of this record is based on the scribe's personal observations and the provider's statements to them. This document has been checked and approved by the attending provider.

## 2022-06-01 ENCOUNTER — Ambulatory Visit
Admission: RE | Admit: 2022-06-01 | Discharge: 2022-06-01 | Disposition: A | Discharge status: Home / Self Care | Payer: BC Managed Care – PPO | Source: Ambulatory Visit | Attending: Radiation Oncology | Admitting: Radiation Oncology

## 2022-06-01 ENCOUNTER — Encounter: Payer: Self-pay | Admitting: Radiation Oncology

## 2022-06-01 ENCOUNTER — Other Ambulatory Visit: Payer: Self-pay

## 2022-06-01 VITALS — BP 124/89 | HR 61 | Temp 96.3°F | Resp 18 | Ht 70.0 in | Wt 175.1 lb

## 2022-06-01 DIAGNOSIS — C50211 Malignant neoplasm of upper-inner quadrant of right female breast: Secondary | ICD-10-CM | POA: Insufficient documentation

## 2022-06-01 DIAGNOSIS — Z510 Encounter for antineoplastic radiation therapy: Secondary | ICD-10-CM | POA: Insufficient documentation

## 2022-06-01 DIAGNOSIS — Z170 Estrogen receptor positive status [ER+]: Secondary | ICD-10-CM | POA: Insufficient documentation

## 2022-06-01 DIAGNOSIS — Z7951 Long term (current) use of inhaled steroids: Secondary | ICD-10-CM | POA: Insufficient documentation

## 2022-06-08 ENCOUNTER — Encounter: Payer: Self-pay | Admitting: *Deleted

## 2022-06-08 DIAGNOSIS — C50211 Malignant neoplasm of upper-inner quadrant of right female breast: Secondary | ICD-10-CM

## 2022-06-10 ENCOUNTER — Telehealth: Payer: Self-pay | Admitting: Hematology and Oncology

## 2022-06-10 NOTE — Telephone Encounter (Signed)
Scheduled appointment per scheduling message. Patient is aware of the made appointment. 

## 2022-06-14 ENCOUNTER — Other Ambulatory Visit: Payer: Self-pay

## 2022-06-14 ENCOUNTER — Ambulatory Visit
Admission: RE | Admit: 2022-06-14 | Discharge: 2022-06-14 | Disposition: A | Discharge status: Home / Self Care | Payer: BC Managed Care – PPO | Source: Ambulatory Visit | Attending: Radiation Oncology | Admitting: Radiation Oncology

## 2022-06-14 DIAGNOSIS — C50211 Malignant neoplasm of upper-inner quadrant of right female breast: Secondary | ICD-10-CM | POA: Diagnosis not present

## 2022-06-14 LAB — RAD ONC ARIA SESSION SUMMARY
Course Elapsed Days: 0
Plan Fractions Treated to Date: 1
Plan Prescribed Dose Per Fraction: 2.67 Gy
Plan Total Fractions Prescribed: 15
Plan Total Prescribed Dose: 40.05 Gy
Reference Point Dosage Given to Date: 2.67 Gy
Reference Point Session Dosage Given: 2.67 Gy
Session Number: 1

## 2022-06-15 ENCOUNTER — Other Ambulatory Visit: Payer: Self-pay

## 2022-06-15 ENCOUNTER — Ambulatory Visit
Admission: RE | Admit: 2022-06-15 | Discharge: 2022-06-15 | Disposition: A | Discharge status: Home / Self Care | Payer: BC Managed Care – PPO | Source: Ambulatory Visit | Attending: Radiation Oncology | Admitting: Radiation Oncology

## 2022-06-15 DIAGNOSIS — C50211 Malignant neoplasm of upper-inner quadrant of right female breast: Secondary | ICD-10-CM | POA: Diagnosis not present

## 2022-06-15 LAB — RAD ONC ARIA SESSION SUMMARY
Course Elapsed Days: 1
Plan Fractions Treated to Date: 2
Plan Prescribed Dose Per Fraction: 2.67 Gy
Plan Total Fractions Prescribed: 15
Plan Total Prescribed Dose: 40.05 Gy
Reference Point Dosage Given to Date: 5.34 Gy
Reference Point Session Dosage Given: 2.67 Gy
Session Number: 2

## 2022-06-16 ENCOUNTER — Ambulatory Visit
Admission: RE | Admit: 2022-06-16 | Discharge: 2022-06-16 | Disposition: A | Discharge status: Home / Self Care | Payer: BC Managed Care – PPO | Source: Ambulatory Visit | Attending: Radiation Oncology | Admitting: Radiation Oncology

## 2022-06-16 ENCOUNTER — Other Ambulatory Visit: Payer: Self-pay

## 2022-06-16 DIAGNOSIS — C50211 Malignant neoplasm of upper-inner quadrant of right female breast: Secondary | ICD-10-CM | POA: Insufficient documentation

## 2022-06-16 DIAGNOSIS — Z170 Estrogen receptor positive status [ER+]: Secondary | ICD-10-CM | POA: Insufficient documentation

## 2022-06-16 DIAGNOSIS — Z510 Encounter for antineoplastic radiation therapy: Secondary | ICD-10-CM | POA: Diagnosis present

## 2022-06-16 LAB — RAD ONC ARIA SESSION SUMMARY
Course Elapsed Days: 2
Plan Fractions Treated to Date: 3
Plan Prescribed Dose Per Fraction: 2.67 Gy
Plan Total Fractions Prescribed: 15
Plan Total Prescribed Dose: 40.05 Gy
Reference Point Dosage Given to Date: 8.01 Gy
Reference Point Session Dosage Given: 2.67 Gy
Session Number: 3

## 2022-06-17 ENCOUNTER — Other Ambulatory Visit: Payer: Self-pay

## 2022-06-17 ENCOUNTER — Encounter: Payer: Self-pay | Admitting: Obstetrics and Gynecology

## 2022-06-17 ENCOUNTER — Ambulatory Visit
Admission: RE | Admit: 2022-06-17 | Discharge: 2022-06-17 | Disposition: A | Discharge status: Home / Self Care | Payer: BC Managed Care – PPO | Source: Ambulatory Visit | Attending: Radiation Oncology | Admitting: Radiation Oncology

## 2022-06-17 DIAGNOSIS — C50211 Malignant neoplasm of upper-inner quadrant of right female breast: Secondary | ICD-10-CM | POA: Diagnosis not present

## 2022-06-17 LAB — RAD ONC ARIA SESSION SUMMARY
Course Elapsed Days: 3
Plan Fractions Treated to Date: 4
Plan Prescribed Dose Per Fraction: 2.67 Gy
Plan Total Fractions Prescribed: 15
Plan Total Prescribed Dose: 40.05 Gy
Reference Point Dosage Given to Date: 10.68 Gy
Reference Point Session Dosage Given: 2.67 Gy
Session Number: 4

## 2022-06-18 ENCOUNTER — Other Ambulatory Visit: Payer: Self-pay

## 2022-06-18 ENCOUNTER — Ambulatory Visit (INDEPENDENT_AMBULATORY_CARE_PROVIDER_SITE_OTHER): Payer: BC Managed Care – PPO | Admitting: Obstetrics & Gynecology

## 2022-06-18 ENCOUNTER — Ambulatory Visit
Admission: RE | Admit: 2022-06-18 | Discharge: 2022-06-18 | Disposition: A | Discharge status: Home / Self Care | Payer: BC Managed Care – PPO | Source: Ambulatory Visit | Attending: Radiation Oncology | Admitting: Radiation Oncology

## 2022-06-18 ENCOUNTER — Encounter: Payer: Self-pay | Admitting: Obstetrics & Gynecology

## 2022-06-18 VITALS — BP 118/76 | HR 60 | Temp 97.9°F

## 2022-06-18 DIAGNOSIS — C50211 Malignant neoplasm of upper-inner quadrant of right female breast: Secondary | ICD-10-CM | POA: Diagnosis not present

## 2022-06-18 DIAGNOSIS — R309 Painful micturition, unspecified: Secondary | ICD-10-CM

## 2022-06-18 LAB — RAD ONC ARIA SESSION SUMMARY
Course Elapsed Days: 4
Plan Fractions Treated to Date: 5
Plan Prescribed Dose Per Fraction: 2.67 Gy
Plan Total Fractions Prescribed: 15
Plan Total Prescribed Dose: 40.05 Gy
Reference Point Dosage Given to Date: 13.35 Gy
Reference Point Session Dosage Given: 2.67 Gy
Session Number: 5

## 2022-06-18 MED ORDER — NITROFURANTOIN MONOHYD MACRO 100 MG PO CAPS: 100.0000 mg | ORAL_CAPSULE | Freq: Two times a day (BID) | ORAL | 0 refills | Status: AC

## 2022-06-18 NOTE — Progress Notes (Signed)
    Rachael Peters 1960/09/06 [id number]        62 y.o.  G2P0100   RP: Urinary frequency and pain with urination  HPI: Patient c/o urinary frequency and pain with urination. Took Azo last night.  Took 1 Augmentin tab she had this morning. No vaginal d/c or itching.  No odor.  No pelvic pain.  No fever.  H/O 1-2 Cystitis per year.  Has done well with MacroBID treatment in the past. Scheduled for Radiation therapy for Breast Ca today.   OB History  Gravida Para Term Preterm AB Living  2 1   1    0  SAB IAB Ectopic Multiple Live Births               # Outcome Date GA Lbr Len/2nd Weight Sex Delivery Anes PTL Lv  2 Preterm         FD     Complications: Incompetent cervix  1 Gravida             Past medical history,surgical history, problem list, medications, allergies, family history and social history were all reviewed and documented in the EPIC chart.   Directed ROS with pertinent positives and negatives documented in the history of present illness/assessment and plan.  Exam:  Vitals:   06/18/22 1024  BP: 118/76  Pulse: 60  Temp: 97.9 F (36.6 C)  TempSrc: Oral  SpO2: 97%   General appearance:  Normal  CVAT Neg bilaterally  Gynecologic exam: Deferred  U/A:  Orange, cloudy, Pro1+, Nit Pos, WBC 20-40, RBC Neg, Bacteria Moderate.  Pending U. Culture.   Assessment/Plan:  62 y.o. G2P0100   1. Pain with urination Patient c/o urinary frequency and pain with urination. Took Azo last night.  Took 1 Augmentin tab she had this morning. No vaginal d/c or itching.  No odor.  No pelvic pain.  No fever.  H/O 1-2 Cystitis per year.  Has done well with MacroBID treatment in the past. Scheduled for Radiation therapy for Breast Ca today.  Probable acute cystitis per U/A.  Will treat with MacroBID x 7 days.  Prescription sent.  Pending U. Culture. - Urinalysis,Complete w/RFL Culture  Other orders - Urine Culture - REFLEXIVE URINE CULTURE - nitrofurantoin, macrocrystal-monohydrate,  (MACROBID) 100 MG capsule; Take 1 capsule (100 mg total) by mouth 2 (two) times daily for 7 days.   Genia Del MD, 10:35 AM 06/18/2022

## 2022-06-21 ENCOUNTER — Ambulatory Visit
Admission: RE | Admit: 2022-06-21 | Discharge: 2022-06-21 | Disposition: A | Discharge status: Home / Self Care | Payer: BC Managed Care – PPO | Source: Ambulatory Visit | Attending: Radiation Oncology | Admitting: Radiation Oncology

## 2022-06-21 ENCOUNTER — Other Ambulatory Visit: Payer: Self-pay

## 2022-06-21 DIAGNOSIS — C50211 Malignant neoplasm of upper-inner quadrant of right female breast: Secondary | ICD-10-CM | POA: Diagnosis not present

## 2022-06-21 LAB — RAD ONC ARIA SESSION SUMMARY
Course Elapsed Days: 7
Plan Fractions Treated to Date: 6
Plan Prescribed Dose Per Fraction: 2.67 Gy
Plan Total Fractions Prescribed: 15
Plan Total Prescribed Dose: 40.05 Gy
Reference Point Dosage Given to Date: 16.02 Gy
Reference Point Session Dosage Given: 2.67 Gy
Session Number: 6

## 2022-06-21 NOTE — Telephone Encounter (Signed)
Pt seen on 06/18/2022. Will close encounter.

## 2022-06-22 ENCOUNTER — Ambulatory Visit
Admission: RE | Admit: 2022-06-22 | Discharge: 2022-06-22 | Disposition: A | Discharge status: Home / Self Care | Payer: BC Managed Care – PPO | Source: Ambulatory Visit | Attending: Radiation Oncology | Admitting: Radiation Oncology

## 2022-06-22 ENCOUNTER — Other Ambulatory Visit: Payer: Self-pay

## 2022-06-22 DIAGNOSIS — C50211 Malignant neoplasm of upper-inner quadrant of right female breast: Secondary | ICD-10-CM | POA: Diagnosis not present

## 2022-06-22 LAB — URINALYSIS, COMPLETE W/RFL CULTURE
Bilirubin Urine: NEGATIVE
Glucose, UA: NEGATIVE
Hgb urine dipstick: NEGATIVE
Hyaline Cast: NONE SEEN /LPF
Nitrites, Initial: POSITIVE — AB
RBC / HPF: NONE SEEN /HPF (ref 0–2)
Specific Gravity, Urine: 1.015 (ref 1.001–1.035)
pH: 7 (ref 5.0–8.0)

## 2022-06-22 LAB — RAD ONC ARIA SESSION SUMMARY
Course Elapsed Days: 8
Plan Fractions Treated to Date: 7
Plan Prescribed Dose Per Fraction: 2.67 Gy
Plan Total Fractions Prescribed: 15
Plan Total Prescribed Dose: 40.05 Gy
Reference Point Dosage Given to Date: 18.69 Gy
Reference Point Session Dosage Given: 2.67 Gy
Session Number: 7

## 2022-06-22 LAB — URINE CULTURE
MICRO NUMBER:: 14910287
Result:: NO GROWTH
SPECIMEN QUALITY:: ADEQUATE

## 2022-06-22 LAB — CULTURE INDICATED

## 2022-06-23 ENCOUNTER — Other Ambulatory Visit: Payer: Self-pay

## 2022-06-23 ENCOUNTER — Ambulatory Visit
Admission: RE | Admit: 2022-06-23 | Discharge: 2022-06-23 | Disposition: A | Discharge status: Home / Self Care | Payer: BC Managed Care – PPO | Source: Ambulatory Visit | Attending: Radiation Oncology | Admitting: Radiation Oncology

## 2022-06-23 DIAGNOSIS — C50211 Malignant neoplasm of upper-inner quadrant of right female breast: Secondary | ICD-10-CM | POA: Diagnosis not present

## 2022-06-23 LAB — RAD ONC ARIA SESSION SUMMARY
Course Elapsed Days: 9
Plan Fractions Treated to Date: 8
Plan Prescribed Dose Per Fraction: 2.67 Gy
Plan Total Fractions Prescribed: 15
Plan Total Prescribed Dose: 40.05 Gy
Reference Point Dosage Given to Date: 21.36 Gy
Reference Point Session Dosage Given: 2.67 Gy
Session Number: 8

## 2022-06-24 ENCOUNTER — Other Ambulatory Visit: Payer: Self-pay

## 2022-06-24 ENCOUNTER — Ambulatory Visit
Admission: RE | Admit: 2022-06-24 | Discharge: 2022-06-24 | Disposition: A | Discharge status: Home / Self Care | Payer: BC Managed Care – PPO | Source: Ambulatory Visit | Attending: Radiation Oncology | Admitting: Radiation Oncology

## 2022-06-24 DIAGNOSIS — C50211 Malignant neoplasm of upper-inner quadrant of right female breast: Secondary | ICD-10-CM | POA: Diagnosis not present

## 2022-06-24 LAB — RAD ONC ARIA SESSION SUMMARY
Course Elapsed Days: 10
Plan Fractions Treated to Date: 9
Plan Prescribed Dose Per Fraction: 2.67 Gy
Plan Total Fractions Prescribed: 15
Plan Total Prescribed Dose: 40.05 Gy
Reference Point Dosage Given to Date: 24.03 Gy
Reference Point Session Dosage Given: 2.67 Gy
Session Number: 9

## 2022-06-25 ENCOUNTER — Ambulatory Visit
Admission: RE | Admit: 2022-06-25 | Discharge: 2022-06-25 | Disposition: A | Discharge status: Home / Self Care | Payer: BC Managed Care – PPO | Source: Ambulatory Visit | Attending: Radiation Oncology | Admitting: Radiation Oncology

## 2022-06-25 ENCOUNTER — Other Ambulatory Visit: Payer: Self-pay

## 2022-06-25 DIAGNOSIS — C50211 Malignant neoplasm of upper-inner quadrant of right female breast: Secondary | ICD-10-CM | POA: Diagnosis not present

## 2022-06-25 LAB — RAD ONC ARIA SESSION SUMMARY
Course Elapsed Days: 11
Plan Fractions Treated to Date: 10
Plan Prescribed Dose Per Fraction: 2.67 Gy
Plan Total Fractions Prescribed: 15
Plan Total Prescribed Dose: 40.05 Gy
Reference Point Dosage Given to Date: 26.7 Gy
Reference Point Session Dosage Given: 2.67 Gy
Session Number: 10

## 2022-06-28 ENCOUNTER — Ambulatory Visit: Payer: BC Managed Care – PPO

## 2022-06-28 ENCOUNTER — Other Ambulatory Visit: Payer: Self-pay

## 2022-06-28 ENCOUNTER — Ambulatory Visit
Admission: RE | Admit: 2022-06-28 | Discharge: 2022-06-28 | Disposition: A | Discharge status: Home / Self Care | Payer: BC Managed Care – PPO | Source: Ambulatory Visit | Attending: Radiation Oncology | Admitting: Radiation Oncology

## 2022-06-28 DIAGNOSIS — C50211 Malignant neoplasm of upper-inner quadrant of right female breast: Secondary | ICD-10-CM | POA: Diagnosis not present

## 2022-06-28 LAB — RAD ONC ARIA SESSION SUMMARY
Course Elapsed Days: 14
Plan Fractions Treated to Date: 11
Plan Prescribed Dose Per Fraction: 2.67 Gy
Plan Total Fractions Prescribed: 15
Plan Total Prescribed Dose: 40.05 Gy
Reference Point Dosage Given to Date: 29.37 Gy
Reference Point Session Dosage Given: 2.67 Gy
Session Number: 11

## 2022-06-29 ENCOUNTER — Ambulatory Visit
Admission: RE | Admit: 2022-06-29 | Discharge: 2022-06-29 | Disposition: A | Discharge status: Home / Self Care | Payer: BC Managed Care – PPO | Source: Ambulatory Visit | Attending: Radiation Oncology | Admitting: Radiation Oncology

## 2022-06-29 ENCOUNTER — Other Ambulatory Visit: Payer: Self-pay

## 2022-06-29 DIAGNOSIS — C50211 Malignant neoplasm of upper-inner quadrant of right female breast: Secondary | ICD-10-CM | POA: Diagnosis not present

## 2022-06-29 LAB — RAD ONC ARIA SESSION SUMMARY
Course Elapsed Days: 15
Plan Fractions Treated to Date: 12
Plan Prescribed Dose Per Fraction: 2.67 Gy
Plan Total Fractions Prescribed: 15
Plan Total Prescribed Dose: 40.05 Gy
Reference Point Dosage Given to Date: 32.04 Gy
Reference Point Session Dosage Given: 2.67 Gy
Session Number: 12

## 2022-06-30 ENCOUNTER — Other Ambulatory Visit: Payer: Self-pay

## 2022-06-30 ENCOUNTER — Ambulatory Visit
Admission: RE | Admit: 2022-06-30 | Discharge: 2022-06-30 | Disposition: A | Discharge status: Home / Self Care | Payer: BC Managed Care – PPO | Source: Ambulatory Visit | Attending: Radiation Oncology | Admitting: Radiation Oncology

## 2022-06-30 DIAGNOSIS — C50211 Malignant neoplasm of upper-inner quadrant of right female breast: Secondary | ICD-10-CM | POA: Diagnosis not present

## 2022-06-30 LAB — RAD ONC ARIA SESSION SUMMARY
Course Elapsed Days: 16
Plan Fractions Treated to Date: 13
Plan Prescribed Dose Per Fraction: 2.67 Gy
Plan Total Fractions Prescribed: 15
Plan Total Prescribed Dose: 40.05 Gy
Reference Point Dosage Given to Date: 34.71 Gy
Reference Point Session Dosage Given: 2.67 Gy
Session Number: 13

## 2022-07-01 ENCOUNTER — Telehealth: Payer: Self-pay

## 2022-07-01 ENCOUNTER — Ambulatory Visit
Admission: RE | Admit: 2022-07-01 | Discharge: 2022-07-01 | Disposition: A | Discharge status: Home / Self Care | Payer: BC Managed Care – PPO | Source: Ambulatory Visit | Attending: Radiation Oncology | Admitting: Radiation Oncology

## 2022-07-01 ENCOUNTER — Other Ambulatory Visit: Payer: Self-pay

## 2022-07-01 DIAGNOSIS — C50211 Malignant neoplasm of upper-inner quadrant of right female breast: Secondary | ICD-10-CM | POA: Diagnosis not present

## 2022-07-01 LAB — RAD ONC ARIA SESSION SUMMARY
Course Elapsed Days: 17
Plan Fractions Treated to Date: 14
Plan Prescribed Dose Per Fraction: 2.67 Gy
Plan Total Fractions Prescribed: 15
Plan Total Prescribed Dose: 40.05 Gy
Reference Point Dosage Given to Date: 37.38 Gy
Reference Point Session Dosage Given: 2.67 Gy
Session Number: 14

## 2022-07-01 NOTE — Telephone Encounter (Signed)
Pt LVM in triage line w/ an inquiry regarding her recent UA/ucx results.  Inquiring as to why/how there could be a showing of "mod" bacteria in the urinalysis but not in the urine culture? Please advise.

## 2022-07-02 ENCOUNTER — Other Ambulatory Visit: Payer: Self-pay

## 2022-07-02 ENCOUNTER — Ambulatory Visit
Admission: RE | Admit: 2022-07-02 | Discharge: 2022-07-02 | Disposition: A | Discharge status: Home / Self Care | Payer: BC Managed Care – PPO | Source: Ambulatory Visit | Attending: Radiation Oncology | Admitting: Radiation Oncology

## 2022-07-02 DIAGNOSIS — C50211 Malignant neoplasm of upper-inner quadrant of right female breast: Secondary | ICD-10-CM | POA: Diagnosis not present

## 2022-07-02 LAB — RAD ONC ARIA SESSION SUMMARY
Course Elapsed Days: 18
Plan Fractions Treated to Date: 15
Plan Prescribed Dose Per Fraction: 2.67 Gy
Plan Total Fractions Prescribed: 15
Plan Total Prescribed Dose: 40.05 Gy
Reference Point Dosage Given to Date: 40.05 Gy
Reference Point Session Dosage Given: 2.67 Gy
Session Number: 15

## 2022-07-02 NOTE — Telephone Encounter (Signed)
Per ML: "Nitrites were not done, urine was orange, probably took Azo.  Bacteria on U/A was probably a contamination from outside.  No bacterial growth on the culture, therefore no Uropathogen. Dr. Elbert Ewings"   Pt notified and voiced understanding. However, voiced confusion as to how she can view the UA results via mychart and states on one note, she can see moderate bacteria and on another, she can see no bacteria.   Pt also states that she is unable to see the actual result of the urine culture and asked if we can make sure that it was in her record. I assured her that it is in her record noted from the day it was collected on 06/18/2022 and ucx resulted on 06/22/22 as "no growth." So confirmed that there was no UTI present and reassured her of Dr. Sharol Roussel response. Pt advised that if she was having trouble with her viewing things in her mychart, that maybe she can try contacting the mychart helpdesk # to receive help/guidance.  Pt voiced understanding and appreciation for call. Will call/write back with any additional questions/concerns. Will route to provider for final review and close encounter.

## 2022-07-05 ENCOUNTER — Ambulatory Visit
Admission: RE | Admit: 2022-07-05 | Discharge: 2022-07-05 | Disposition: A | Discharge status: Home / Self Care | Payer: BC Managed Care – PPO | Source: Ambulatory Visit | Attending: Radiation Oncology | Admitting: Radiation Oncology

## 2022-07-05 ENCOUNTER — Other Ambulatory Visit: Payer: Self-pay

## 2022-07-05 DIAGNOSIS — C50211 Malignant neoplasm of upper-inner quadrant of right female breast: Secondary | ICD-10-CM | POA: Diagnosis not present

## 2022-07-05 LAB — RAD ONC ARIA SESSION SUMMARY
Course Elapsed Days: 21
Plan Fractions Treated to Date: 1
Plan Prescribed Dose Per Fraction: 2 Gy
Plan Total Fractions Prescribed: 5
Plan Total Prescribed Dose: 10 Gy
Reference Point Dosage Given to Date: 2 Gy
Reference Point Session Dosage Given: 2 Gy
Session Number: 16

## 2022-07-06 ENCOUNTER — Other Ambulatory Visit: Payer: Self-pay

## 2022-07-06 ENCOUNTER — Ambulatory Visit
Admission: RE | Admit: 2022-07-06 | Discharge: 2022-07-06 | Disposition: A | Discharge status: Home / Self Care | Payer: BC Managed Care – PPO | Source: Ambulatory Visit | Attending: Radiation Oncology | Admitting: Radiation Oncology

## 2022-07-06 ENCOUNTER — Inpatient Hospital Stay (HOSPITAL_BASED_OUTPATIENT_CLINIC_OR_DEPARTMENT_OTHER): Payer: BC Managed Care – PPO | Admitting: Hematology and Oncology

## 2022-07-06 VITALS — BP 116/71 | HR 69 | Temp 98.1°F | Resp 16 | Wt 173.0 lb

## 2022-07-06 DIAGNOSIS — Z923 Personal history of irradiation: Secondary | ICD-10-CM | POA: Insufficient documentation

## 2022-07-06 DIAGNOSIS — Z780 Asymptomatic menopausal state: Secondary | ICD-10-CM | POA: Diagnosis not present

## 2022-07-06 DIAGNOSIS — Z170 Estrogen receptor positive status [ER+]: Secondary | ICD-10-CM | POA: Insufficient documentation

## 2022-07-06 DIAGNOSIS — Z79811 Long term (current) use of aromatase inhibitors: Secondary | ICD-10-CM | POA: Insufficient documentation

## 2022-07-06 DIAGNOSIS — C50211 Malignant neoplasm of upper-inner quadrant of right female breast: Secondary | ICD-10-CM | POA: Insufficient documentation

## 2022-07-06 LAB — RAD ONC ARIA SESSION SUMMARY
Course Elapsed Days: 22
Plan Fractions Treated to Date: 2
Plan Prescribed Dose Per Fraction: 2 Gy
Plan Total Fractions Prescribed: 5
Plan Total Prescribed Dose: 10 Gy
Reference Point Dosage Given to Date: 4 Gy
Reference Point Session Dosage Given: 2 Gy
Session Number: 17

## 2022-07-06 MED ORDER — ANASTROZOLE 1 MG PO TABS: 1.0000 mg | ORAL_TABLET | Freq: Every day | ORAL | 3 refills | Status: DC

## 2022-07-06 NOTE — Assessment & Plan Note (Addendum)
04/25/2022:Right lumpectomy: 0.7 cm grade 1 IDC, margins negative, 0/2 lymph nodes negative, ER 100%, PR 85%, Ki-67 5%, HER2 0 negative    Treatment plan: Adjuvant radiation therapy 06/15/2022-07/09/2022 Adjuvant antiestrogen therapy with anastrozole to start 07/17/2022 -------------------------------------------------------------------------------------------------------------- Anastrozole counseling: We discussed the risks and benefits of anti-estrogen therapy with aromatase inhibitors. These include but not limited to insomnia, hot flashes, mood changes, vaginal dryness, bone density loss, and weight gain. We strongly believe that the benefits far outweigh the risks. Patient understands these risks and consented to starting treatment. Planned treatment duration is 5-7 years.  Patient is very concerned about osteoporosis.  We will obtain a bone density test in the next couple of weeks.  We discussed that the risk of recurrence is relatively low given the favorable type of her breast cancer.  However I strongly still support the use of antiestrogen therapy and further reducing the risk of distant recurrence especially if she could tolerate it very well.  Return to clinic in 3 months for survivorship care plan visit

## 2022-07-06 NOTE — Progress Notes (Signed)
Patient Care Team: [name], FNP as PCP - General (Internal Medicine) Donnelly Angelica, RN as Oncology Nurse Navigator Pershing Proud, RN as Oncology Nurse Navigator Serena Croissant, MD as Consulting Physician (Hematology and Oncology) Emelia Loron, MD as Consulting Physician (General Surgery) Lonie Peak, MD as Attending Physician (Radiation Oncology)  DIAGNOSIS:  Encounter Diagnoses  Name Primary?   Malignant neoplasm of upper-inner quadrant of right breast in female, estrogen receptor positive (HCC) Yes   Postmenopausal     SUMMARY OF ONCOLOGIC HISTORY: Oncology History  Malignant neoplasm of upper-inner quadrant of right breast in female, estrogen receptor positive (HCC)  03/25/2022 Initial Diagnosis   Screening mammogram detected right breast mass 0.8 cm, axilla negative, biopsy: Grade 1 IDC ER 100%, PR 85%, Ki67 5%, HER2 0   04/13/2022 Cancer Staging   Staging form: Breast, AJCC 8th Edition - Clinical: Stage IA (cT1b, cN0, cM0, G1, ER+, PR+, HER2-) - Signed by Serena Croissant, MD on 04/13/2022 Stage prefix: Initial diagnosis Histologic grading system: 3 grade system   04/25/2022 Surgery   Right lumpectomy: 0.7 cm grade 1 IDC, margins negative, 0/2 lymph nodes negative, ER 100%, PR 85%, Ki-67 5%, HER2 0 negative     CHIEF COMPLIANT: Follow-up after radiation  INTERVAL HISTORY: Rachael Peters is a 62 y.o. female is here because of recent diagnosis of right breast cancer. She presents to the clinic for a follow-up to start antiestrogen therapy. She reports that radiation is going well. She says she does have hot flashes already.   ALLERGIES:  is allergic to codeine.  MEDICATIONS:  Current Outpatient Medications  Medication Sig Dispense Refill   albuterol (VENTOLIN HFA) 108 (90 Base) MCG/ACT inhaler albuterol sulfate HFA 90 mcg/actuation aerosol inhaler  1 2 INHALATIONS EVERY 4 6 HOURS AS NEEDED FOR SHORTNESS OF BREATH.     anastrozole (ARIMIDEX) 1 MG  tablet Take 1 tablet (1 mg total) by mouth daily. 90 tablet 3   budesonide-formoterol (SYMBICORT) 80-4.5 MCG/ACT inhaler TAKE 2 PUFFS BY MOUTH TWICE A DAY     Cyanocobalamin 3000 MCG SUBL Place 1 tablet under the tongue daily.     escitalopram (LEXAPRO) 10 MG tablet 1 tablet     No current facility-administered medications for this visit.    PHYSICAL EXAMINATION: ECOG PERFORMANCE STATUS: 1 - Symptomatic but completely ambulatory  Vitals:   07/06/22 1002  BP: 116/71  Pulse: 69  Resp: 16  Temp: 98.1 F (36.7 C)  SpO2: 100%   Filed Weights   07/06/22 1002  Weight: 173 lb (78.5 kg)      LABORATORY DATA:  I have reviewed the data as listed    Latest Ref Rng & Units 07/10/2020   12:55 PM 09/26/2018   11:22 AM 04/21/2018    5:37 AM  CMP  Glucose 70 - 99 mg/dL 78  89  161   BUN 6 - 23 mg/dL 10  13  7    Creatinine 0.40 - 1.20 mg/dL 0.96  0.45  4.09   Sodium 135 - 145 mEq/L 135  138  137   Potassium 3.5 - 5.1 mEq/L 3.3  4.1  3.9   Chloride 96 - 112 mEq/L 99  97  105   CO2 19 - 32 mEq/L 30  27  26    Calcium 8.4 - 10.5 mg/dL 8.8  9.1  8.2   Total Protein 6.0 - 8.5 g/dL  7.1    Total Bilirubin 0.0 - 1.2 mg/dL  0.4    Alkaline  Phos 39 - 117 IU/L  76    AST 0 - 40 IU/L  19    ALT 0 - 32 IU/L  15      Lab Results  Component Value Date   WBC 5.4 09/26/2018   HGB 13.6 09/26/2018   HCT 43.2 09/26/2018   MCV 92 09/26/2018   PLT 177 09/26/2018   NEUTROABS 3.0 02/24/2017    ASSESSMENT & PLAN:  Malignant neoplasm of upper-inner quadrant of right breast in female, estrogen receptor positive (HCC) 04/25/2022:Right lumpectomy: 0.7 cm grade 1 IDC, margins negative, 0/2 lymph nodes negative, ER 100%, PR 85%, Ki-67 5%, HER2 0 negative    Treatment plan: Adjuvant radiation therapy 06/15/2022-07/09/2022 Adjuvant antiestrogen therapy with anastrozole to start 07/17/2022 -------------------------------------------------------------------------------------------------------------- Anastrozole  counseling: We discussed the risks and benefits of anti-estrogen therapy with aromatase inhibitors. These include but not limited to insomnia, hot flashes, mood changes, vaginal dryness, bone density loss, and weight gain. We strongly believe that the benefits far outweigh the risks. Patient understands these risks and consented to starting treatment. Planned treatment duration is 5-7 years.  Patient is very concerned about osteoporosis.  We will obtain a bone density test in the next couple of weeks.  We discussed that the risk of recurrence is relatively low given the favorable type of her breast cancer.  However I strongly still support the use of antiestrogen therapy and further reducing the risk of distant recurrence especially if she could tolerate it very well.  We briefly discussed Signatera testing and she will discuss this and let us know what she wishes to do the follow-up appointment. Return to clinic in 3 months for survivorship care plan visit      Orders Placed This Encounter  Procedures   DG Bone Density    Standing Status:   Future    Standing Expiration Date:   07/06/2023    Order Specific Question:   Reason for Exam (SYMPTOM  OR DIAGNOSIS REQUIRED)    Answer:   postmenopausal    Order Specific Question:   Is patient pregnant?    Answer:   No    Order Specific Question:   Preferred imaging location?    Answer:   [facility]   The patient has a good understanding of the overall plan. she agrees with it. she will call with any problems that may develop before the next visit here. Total time spent: 30 mins including face to face time and time spent for planning, charting and co-ordination of care   Tamsen Meek, MD 07/06/22    I Janan Ridge am acting as a Neurosurgeon for [employer]  I have reviewed the above documentation for accuracy and completeness, and I agree with the above.

## 2022-07-07 ENCOUNTER — Other Ambulatory Visit: Payer: Self-pay

## 2022-07-07 ENCOUNTER — Ambulatory Visit
Admission: RE | Admit: 2022-07-07 | Discharge: 2022-07-07 | Disposition: A | Discharge status: Home / Self Care | Payer: BC Managed Care – PPO | Source: Ambulatory Visit | Attending: Radiation Oncology | Admitting: Radiation Oncology

## 2022-07-07 DIAGNOSIS — C50211 Malignant neoplasm of upper-inner quadrant of right female breast: Secondary | ICD-10-CM | POA: Diagnosis not present

## 2022-07-07 LAB — RAD ONC ARIA SESSION SUMMARY
Course Elapsed Days: 23
Plan Fractions Treated to Date: 3
Plan Prescribed Dose Per Fraction: 2 Gy
Plan Total Fractions Prescribed: 5
Plan Total Prescribed Dose: 10 Gy
Reference Point Dosage Given to Date: 6 Gy
Reference Point Session Dosage Given: 2 Gy
Session Number: 18

## 2022-07-08 ENCOUNTER — Other Ambulatory Visit: Payer: Self-pay

## 2022-07-08 ENCOUNTER — Ambulatory Visit
Admission: RE | Admit: 2022-07-08 | Discharge: 2022-07-08 | Disposition: A | Discharge status: Home / Self Care | Payer: BC Managed Care – PPO | Source: Ambulatory Visit | Attending: Radiation Oncology | Admitting: Radiation Oncology

## 2022-07-08 DIAGNOSIS — C50211 Malignant neoplasm of upper-inner quadrant of right female breast: Secondary | ICD-10-CM | POA: Diagnosis not present

## 2022-07-08 LAB — RAD ONC ARIA SESSION SUMMARY
Course Elapsed Days: 24
Plan Fractions Treated to Date: 4
Plan Prescribed Dose Per Fraction: 2 Gy
Plan Total Fractions Prescribed: 5
Plan Total Prescribed Dose: 10 Gy
Reference Point Dosage Given to Date: 8 Gy
Reference Point Session Dosage Given: 2 Gy
Session Number: 19

## 2022-07-09 ENCOUNTER — Ambulatory Visit
Admission: RE | Admit: 2022-07-09 | Discharge: 2022-07-09 | Disposition: A | Discharge status: Home / Self Care | Payer: BC Managed Care – PPO | Source: Ambulatory Visit | Attending: Radiation Oncology | Admitting: Radiation Oncology

## 2022-07-09 ENCOUNTER — Other Ambulatory Visit: Payer: Self-pay

## 2022-07-09 DIAGNOSIS — C50211 Malignant neoplasm of upper-inner quadrant of right female breast: Secondary | ICD-10-CM | POA: Diagnosis not present

## 2022-07-09 LAB — RAD ONC ARIA SESSION SUMMARY
Course Elapsed Days: 25
Plan Fractions Treated to Date: 5
Plan Prescribed Dose Per Fraction: 2 Gy
Plan Total Fractions Prescribed: 5
Plan Total Prescribed Dose: 10 Gy
Reference Point Dosage Given to Date: 10 Gy
Reference Point Session Dosage Given: 1.8827 Gy
Session Number: 20

## 2022-07-20 NOTE — Radiation Completion Notes (Signed)
Patient Name: Rachael Peters, Rachael Peters MRN: [id number] Date of Birth: [birth year] Referring Physician: Serena Croissant, M.D. Date of Service: 2022-07-20 Radiation Oncologist: Lonie Peak, M.D. [facility] [place]                             RADIATION ONCOLOGY END OF TREATMENT NOTE     Diagnosis: C50.211 Malignant neoplasm of upper-inner quadrant of right female breast Staging on 2022-04-13: Malignant neoplasm of upper-inner quadrant of right breast in female, estrogen receptor positive (HCC) T=cT1b, N=cN0, M=cM0 Intent: Curative     ==========DELIVERED PLANS==========  First Treatment Date: 2022-06-14 - Last Treatment Date: 2022-07-09   Plan Name: Breast_R Site: Breast, Right Technique: 3D Mode: Photon Dose Per Fraction: 2.67 Gy Prescribed Dose (Delivered / Prescribed): 40.05 Gy / 40.05 Gy Prescribed Fxs (Delivered / Prescribed): 15 / 15   Plan Name: Breast_R_Bst Site: Breast, Right Technique: Electron Mode: Electron Dose Per Fraction: 2 Gy Prescribed Dose (Delivered / Prescribed): 10 Gy / 10 Gy Prescribed Fxs (Delivered / Prescribed): 5 / 5     ==========ON TREATMENT VISIT DATES========== 2022-06-14, 2022-06-21, 2022-06-28, 2022-07-05     ==========UPCOMING VISITS==========       ==========APPENDIX - ON TREATMENT VISIT NOTES==========   See weekly On Treatment Notes in Epic for details.

## 2022-07-29 NOTE — Progress Notes (Signed)
Rachael Peters presents today for follow-up after completing radiation to her right breast on 07-09-22.  Pain: denies at this time Skin: healing well, nipple color remains dark but is improving, encouraged use of vitamin e cream for two months  Fatigue: reports she is about 80% back to normal, states when she gets tired she is resting ROM: normal Lymphedema: none MedOnc F/U: Lillard Anes 10-14-22 Other issues of note: Pt reports Arimidex is making her have severe hot flashes. She reports that she is willing to try it for 30 days and will keep med onc updated on how she is doing with it. She wanted to thank Dr. Basilio Cairo for her amazing care. She stated she had a great radiation experience with great healing so far.   Pt reports Yes No Comments  Tamoxifen []  [x]    Letrozole []  [x]    Anastrazole [x]  []  1mg   Mammogram [x]  Date:  []  Encouraged her to keep yearly mammogram appts.

## 2022-08-02 ENCOUNTER — Ambulatory Visit: Payer: BC Managed Care – PPO

## 2022-08-12 ENCOUNTER — Telehealth: Payer: Self-pay

## 2022-08-12 ENCOUNTER — Encounter: Payer: Self-pay | Admitting: Radiation Oncology

## 2022-08-12 ENCOUNTER — Ambulatory Visit
Admission: RE | Admit: 2022-08-12 | Discharge: 2022-08-12 | Disposition: A | Discharge status: Home / Self Care | Payer: BC Managed Care – PPO | Source: Ambulatory Visit | Attending: Radiation Oncology | Admitting: Radiation Oncology

## 2022-08-12 NOTE — Telephone Encounter (Signed)
Rn called pt for telephone follow up. Note completed and routed to Dr. Squire.  

## 2022-10-08 ENCOUNTER — Telehealth: Payer: Self-pay | Admitting: Adult Health

## 2022-10-08 NOTE — Telephone Encounter (Signed)
Canceled appointment per incoming call. Patient does not wish to reschedule her appointment at this time.

## 2022-10-11 ENCOUNTER — Ambulatory Visit (HOSPITAL_BASED_OUTPATIENT_CLINIC_OR_DEPARTMENT_OTHER)
Admission: RE | Admit: 2022-10-11 | Discharge: 2022-10-11 | Disposition: A | Discharge status: Home / Self Care | Payer: BC Managed Care – PPO | Source: Ambulatory Visit | Attending: Hematology and Oncology | Admitting: Hematology and Oncology

## 2022-10-11 DIAGNOSIS — C50211 Malignant neoplasm of upper-inner quadrant of right female breast: Secondary | ICD-10-CM | POA: Insufficient documentation

## 2022-10-11 DIAGNOSIS — Z780 Asymptomatic menopausal state: Secondary | ICD-10-CM | POA: Diagnosis present

## 2022-10-11 DIAGNOSIS — Z170 Estrogen receptor positive status [ER+]: Secondary | ICD-10-CM | POA: Insufficient documentation

## 2022-10-14 ENCOUNTER — Encounter: Payer: BC Managed Care – PPO | Admitting: Adult Health

## 2022-10-19 ENCOUNTER — Encounter: Payer: Self-pay | Admitting: *Deleted

## 2022-10-22 ENCOUNTER — Telehealth: Payer: Self-pay

## 2022-10-22 NOTE — Telephone Encounter (Signed)
Pt called to discuss dexa results. Per MD reviewed results and recommended WB exercises and calcium, vitamin D supplements. She verbalized thanks and understanding.

## 2022-12-20 NOTE — Progress Notes (Signed)
62 y.o. G76P0100 Married Caucasian female here for annual exam.   New dx of right breast cancer.  Status post lumpectomy, XRT, and Arimidex.  Had significant hot flashes from the Arimidex, so she stopped this.  She indicated to her oncologist that she was having difficulty tolerating it.   Lexapro helps her anxiety.   Her mother is living at her home.  Mother in law fell while in [place] and now remains there in care.   PCP: [name], FNP   Patient's last menstrual period was 02/15/2013 (approximate).           Sexually active: Yes.    The current method of family planning is post menopausal status.    Menopausal hormone therapy:  n/a Exercising: Yes.     Personal training 2x a week, cardio 1-2x a week Smoker:  former  OB History  Gravida Para Term Preterm AB Living  2 1   1    0  SAB IAB Ectopic Multiple Live Births               # Outcome Date GA Lbr Len/2nd Weight Sex Type Anes PTL Lv  2 Preterm         FD     Complications: Incompetent cervix  1 Gravida              HEALTH MAINTENANCE:    Component Value Date/Time   DIAGPAP  12/31/2021 1015    - Negative for intraepithelial lesion or malignancy (NILM)   HPVHIGH Negative 12/31/2021 1015   ADEQPAP  12/31/2021 1015    Satisfactory for evaluation; transformation zone component PRESENT.    History of abnormal Pap or positive HPV:  yes,  Hx conization of cervix 1990.  Mammogram: 03/19/22 Breast Density Cat C, BI-RADS CAT 5.  Due in February.  Colonoscopy:  2022 - due in 2032.  Dr. Ewing Schlein.  Bone Density:  10/11/22  Result  osteopenia of left hip.  FRAX:  14.8%, 0.5%.   Immunization History  Administered Date(s) Administered   PFIZER(Purple Top)SARS-COV-2 Vaccination 05/04/2019, 05/29/2019      reports that she quit smoking about 4 years ago. Her smoking use included cigarettes. She started smoking about 5 years ago. She has a 5 pack-year smoking history. She has never used smokeless tobacco. She reports  that she does not currently use alcohol. She reports that she does not use drugs.  Past Medical History:  Diagnosis Date   Abnormal Pap smear of cervix 1990   hx cervical conization for abnormal pap--paps normal since   Anxiety    Asthma    Breast cancer (HCC)    Cat allergies    COPD (chronic obstructive pulmonary disease) (HCC)    PONV (postoperative nausea and vomiting)     Past Surgical History:  Procedure Laterality Date   APPENDECTOMY  1997   BREAST BIOPSY Right 03/25/2022   [place] RT BREAST BX W LOC DEV 1ST LESION IMG BX SPEC US GUIDE 03/25/2022 [facility]   BREAST BIOPSY  05/03/2022   [place] RT RADIOACTIVE SEED LOC 05/03/2022 [facility]   BREAST LUMPECTOMY WITH RADIOACTIVE SEED AND SENTINEL LYMPH NODE BIOPSY Right 05/04/2022   Procedure: RIGHT BREAST LUMPECTOMY WITH RADIOACTIVE SEED AND AXILLARY SENTINEL LYMPH NODE BIOPSY;  Surgeon: Emelia Loron, MD;  Location: [facility];  Service: General;  Laterality: Right;   CERVICAL CONIZATION W/BX  1990   AND CERVIX LESION DESTRUCTION     RETINAL TEAR REPAIR CRYOTHERAPY Right  ROBOTIC ASSITED PARTIAL NEPHRECTOMY Right 04/20/2018   Procedure: XI ROBOTIC ASSITED LAPAROSCOPIC PARTIAL NEPHRECTOMY;  Surgeon: Heloise Purpura, MD;  Location: [facility];  Service: Urology;  Laterality: Right;    Current Outpatient Medications  Medication Sig Dispense Refill   albuterol (VENTOLIN HFA) 108 (90 Base) MCG/ACT inhaler albuterol sulfate HFA 90 mcg/actuation aerosol inhaler  1 2 INHALATIONS EVERY 4 6 HOURS AS NEEDED FOR SHORTNESS OF BREATH.     anastrozole (ARIMIDEX) 1 MG tablet Take 1 tablet (1 mg total) by mouth daily. 90 tablet 3   budesonide-formoterol (SYMBICORT) 80-4.5 MCG/ACT inhaler TAKE 2 PUFFS BY MOUTH TWICE A DAY     Cyanocobalamin 3000 MCG SUBL Place 1 tablet under the tongue daily.     escitalopram (LEXAPRO) 10 MG tablet 1 tablet     No current facility-administered medications for this visit.    ALLERGIES:  Codeine  Family History  Problem Relation Age of Onset   Heart disease Maternal Grandmother    Heart disease Maternal Grandfather    Diabetes Maternal Grandfather    Heart disease Paternal Grandmother    Heart disease Paternal Grandfather    Breast cancer Mother 62   Breast cancer Sister 57   Heart disease Father    Stroke Father    Heart attack Father     Review of Systems  All other systems reviewed and are negative.   PHYSICAL EXAM:  BP 112/68 (BP Location: Left Arm, Patient Position: Sitting, Cuff Size: Normal)   Pulse 69   Ht 5' 9.5" (1.765 m)   Wt 170 lb (77.1 kg)   LMP 02/15/2013 (Approximate) Comment: sexually active  SpO2 97%   BMI 24.74 kg/m     General appearance: alert, cooperative and appears stated age Head: normocephalic, without obvious abnormality, atraumatic Neck: no adenopathy, supple, symmetrical, trachea midline and thyroid normal to inspection and palpation Lungs: clear to auscultation bilaterally Breasts: left - normal appearance, no masses or tenderness, No nipple retraction or dimpling, No nipple discharge or bleeding, No axillary adenopathy Right with areolar changes and scar, no masses or tenderness, No nipple retraction or dimpling, No nipple discharge or bleeding, No axillary adenopathy Heart: regular rate and rhythm Abdomen: soft, non-tender; no masses, no organomegaly Extremities: extremities normal, atraumatic, no cyanosis or edema Skin: skin color, texture, turgor normal. No rashes or lesions Lymph nodes: cervical, supraclavicular, and axillary nodes normal. Neurologic: grossly normal  Pelvic: External genitalia:  no lesions              No abnormal inguinal nodes palpated.              Urethra:  normal appearing urethra with no masses, tenderness or lesions              Bartholins and Skenes: normal                 Vagina: normal appearing vagina with normal color and discharge, no lesions              Cervix: no lesions               Pap taken: No. Bimanual Exam:  Uterus:  normal size, contour, position, consistency, mobility, non-tender              Adnexa: no mass, fullness, tenderness              Rectal exam: Yes.  .  Confirms.  Anus:  normal sphincter tone, no lesions  Chaperone was present for exam:  Warren Lacy, CMA  ASSESSMENT: Well woman visit with gynecologic exam. Right breast cancer.  Status post lumpectomy and XRT.   Off Arimidex.  Negative personal genetic testing.  FH of breast cancer in mother and sister.  Sister had negative BRCA testing.  Remote hx of cervical conization.  Osteopenia.  Former smoker.  Status post partial right nephrectomy for cystic angiomyolipoma.  Anxiety. On Lexapro. Hx UTIs.    Hx IBS.  FH CAD.   PLAN: Mammogram screening discussed. Self breast awareness reviewed. Pap and HRV collected:  No.  Due in 2028.  Medication refills:  none. She will follow up with her oncologist, Dr. Pamelia Hoit, to let him know that she did indeed stop her Arimidex. Guidelines for Calcium, Vitamin D, regular exercise program including cardiovascular and weight bearing exercise. BMD in 2026.  Follow up:  1 year and prn.

## 2023-01-03 ENCOUNTER — Ambulatory Visit (INDEPENDENT_AMBULATORY_CARE_PROVIDER_SITE_OTHER): Payer: BC Managed Care – PPO | Admitting: Obstetrics and Gynecology

## 2023-01-03 ENCOUNTER — Encounter: Payer: Self-pay | Admitting: Obstetrics and Gynecology

## 2023-01-03 VITALS — BP 112/68 | HR 69 | Ht 69.5 in | Wt 170.0 lb

## 2023-01-03 DIAGNOSIS — Z01419 Encounter for gynecological examination (general) (routine) without abnormal findings: Secondary | ICD-10-CM | POA: Diagnosis not present

## 2023-01-03 NOTE — Patient Instructions (Signed)

## 2023-02-24 ENCOUNTER — Other Ambulatory Visit: Payer: Self-pay | Admitting: Obstetrics and Gynecology

## 2023-02-24 DIAGNOSIS — Z853 Personal history of malignant neoplasm of breast: Secondary | ICD-10-CM

## 2023-02-24 DIAGNOSIS — Z09 Encounter for follow-up examination after completed treatment for conditions other than malignant neoplasm: Secondary | ICD-10-CM

## 2023-03-19 ENCOUNTER — Other Ambulatory Visit: Payer: Self-pay | Admitting: Obstetrics and Gynecology

## 2023-03-19 ENCOUNTER — Ambulatory Visit
Admission: RE | Admit: 2023-03-19 | Discharge: 2023-03-19 | Disposition: A | Discharge status: Home / Self Care | Payer: BC Managed Care – PPO | Source: Ambulatory Visit | Attending: Obstetrics and Gynecology | Admitting: Obstetrics and Gynecology

## 2023-03-19 DIAGNOSIS — Z09 Encounter for follow-up examination after completed treatment for conditions other than malignant neoplasm: Secondary | ICD-10-CM

## 2023-03-19 DIAGNOSIS — Z853 Personal history of malignant neoplasm of breast: Secondary | ICD-10-CM

## 2023-03-21 ENCOUNTER — Encounter: Payer: Self-pay | Admitting: Obstetrics and Gynecology

## 2023-05-09 ENCOUNTER — Ambulatory Visit: Payer: BC Managed Care – PPO | Attending: Cardiology | Admitting: Cardiology

## 2023-05-09 ENCOUNTER — Encounter: Payer: Self-pay | Admitting: Cardiology

## 2023-05-09 VITALS — BP 114/76 | HR 64 | Resp 16 | Ht 69.0 in | Wt 173.0 lb

## 2023-05-09 DIAGNOSIS — R079 Chest pain, unspecified: Secondary | ICD-10-CM | POA: Diagnosis not present

## 2023-05-09 DIAGNOSIS — R0609 Other forms of dyspnea: Secondary | ICD-10-CM | POA: Diagnosis not present

## 2023-05-09 DIAGNOSIS — Z8249 Family history of ischemic heart disease and other diseases of the circulatory system: Secondary | ICD-10-CM

## 2023-05-09 MED ORDER — ASPIRIN 81 MG PO TBEC: 81.0000 mg | DELAYED_RELEASE_TABLET | Freq: Every day | ORAL | Status: AC

## 2023-05-09 MED ORDER — NITROGLYCERIN 0.4 MG SL SUBL: 0.4000 mg | SUBLINGUAL_TABLET | SUBLINGUAL | 3 refills | Status: AC | PRN

## 2023-05-09 NOTE — Progress Notes (Signed)
 Cardiology Office Note:  .   Date:  05/09/2023  ID:  Rachael Peters, DOB 07-Sep-1960, MRN [id number] PCP: [name], FNP  [facility] Providers Cardiologist:  Truett Mainland, MD PCP: [name], FNP  Chief Complaint  Patient presents with   Exercise intolerance   New Patient (Initial Visit)      History of Present Illness: .    Rachael Peters is a 63 y.o. Peters with exertional chest pain, family history of early CAD  Discussed the use of AI scribe software for clinical note transcription with the patient, who gave verbal consent to proceed.  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 She reports a significant family history of heart disease, including both parents, all four grandparents, and a sibling. She was previously able to exercise at a high level on a stepper machine, but after a two-week break due to illness, she has been unable to return to her previous level of fitness. She has also noticed an increase in her heart rate to 150 bpm within a few minutes of starting exercise, which is a new development. She has also noticed low blood pressure readings post-exercise. She has had a stress test many years ago, but not recently. She has also noticed difficulty walking uphill when walking her dogs. She was diagnosed with asthma in 2019 and acknowledges that her lung health is not optimal. She has a history of smoking for ten years but quit over five years ago. She also has a history of breast cancer, treated with surgery and radiation, and is now one year post-treatment. She has a nodule on her lung that is currently being monitored.     Vitals:   05/09/23 1606  BP: 114/76  Pulse: 64  Resp: 16  SpO2: 98%     ROS:  Review of Systems  Cardiovascular:  Positive for chest pain and dyspnea on exertion. Negative for leg swelling, palpitations  and syncope.        Studies Reviewed: Marland Kitchen        EKG 05/09/2023: Sinus bradycardia Left axis deviation When compared with ECG of 16-Feb-2018 16:17, QRS axis Shifted left    Independently interpreted 02/2023: Chol 147, TG 96, HDL 62, LDL 67 Hb 12.8 Cr 6.57 TSH 1.5 ALT 33    Physical Exam:   Physical Exam Vitals and nursing note reviewed.  Constitutional:      General: She is not in acute distress. Neck:     Vascular: No JVD.  Cardiovascular:     Rate and Rhythm: Normal rate and regular rhythm.     Heart sounds: Normal heart sounds. No murmur heard. Pulmonary:     Effort: Pulmonary effort is normal.     Breath sounds: Normal breath sounds. No wheezing or rales.  Musculoskeletal:     Right lower leg: No edema.     Left lower leg: No edema.      VISIT DIAGNOSES:   ICD-10-CM   1. Exertional dyspnea  R06.09 EKG 12-Lead    ECHOCARDIOGRAM COMPLETE    2. Exertional chest pain  R07.9 Myocardial Perfusion Imaging    3. Family history of early CAD  Z82.49        ASSESSMENT AND PLAN: .    Rachael Peters is a 63 y.o. Peters with exertional chest pain, family history of early CAD   Assessment and Plan Assessment & Plan Exertional chest  pain and tachycardia Experiences exertional chest pain and tachycardia with heart rate reaching 150 bpm post-exercise. Family history of heart disease significant. Post-exercise hypotension noted. Differential includes deconditioning and potential non-calcified plaque. Absence of calcified plaque on imaging reassuring but does not exclude noncalcified plaque. - Order exercise nuclear stress test and echocardiogram. - If tests normal, consider deconditioning as cause with no further cardiac follow-up. - If tests abnormal, discuss further steps.  Informed Consent   Shared Decision Making/Informed Consent The risks [chest pain, shortness of breath, cardiac arrhythmias, dizziness, blood pressure fluctuations, myocardial infarction,  stroke/transient ischemic attack, nausea, vomiting, allergic reaction, radiation exposure, metallic taste sensation and life-threatening complications (estimated to be 1 in 10,000)], benefits (risk stratification, diagnosing coronary artery disease, treatment guidance) and alternatives of a nuclear stress test were discussed in detail with Ms. Leitch and she agrees to proceed.        Meds ordered this encounter  Medications   nitroGLYCERIN (NITROSTAT) 0.4 MG SL tablet    Sig: Place 1 tablet (0.4 mg total) under the tongue every 5 (five) minutes as needed for chest pain.    Dispense:  60 tablet    Refill:  3   aspirin EC 81 MG tablet    Sig: Take 1 tablet (81 mg total) by mouth daily. Swallow whole.      Signed, Elder Negus, MD

## 2023-05-09 NOTE — Patient Instructions (Signed)
 MEDICATION: START AS NEEDED Nitroglycerin  START Aspirin 81 mg take one tablet by mouth daily   Testing/Procedures: EXERCISE NUCLEAR STRESS TEST   Your physician has requested that you have en exercise stress myoview. For further information please visit [email or web address] Please follow instruction sheet, as given.   ECHO  Your physician has requested that you have an echocardiogram. Echocardiography is a painless test that uses sound waves to create images of your heart. It provides your doctor with information about the size and shape of your heart and how well your heart's chambers and valves are working. This procedure takes approximately one hour. There are no restrictions for this procedure. Please do NOT wear cologne, perfume, aftershave, or lotions (deodorant is allowed). Please arrive 15 minutes prior to your appointment time.  Please note: We ask at that you not bring children with you during ultrasound (echo/ vascular) testing. Due to room size and safety concerns, children are not allowed in the ultrasound rooms during exams. Our front office staff cannot provide observation of children in our lobby area while testing is being conducted. An adult accompanying a patient to their appointment will only be allowed in the ultrasound room at the discretion of the ultrasound technician under special circumstances. We apologize for any inconvenience.    Follow-Up: At [facility], you and your health needs are our priority.  As part of our continuing mission to provide you with exceptional heart care, we have created designated Provider Care Teams.  These Care Teams include your primary Cardiologist (physician) and Advanced Practice Providers (APPs -  Physician Assistants and Nurse Practitioners) who all work together to provide you with the care you need, when you need it.   Your next appointment:   AS NEEDED   Provider:   Elder Negus, MD     Other  Instructions   1st Floor: - Lobby - Registration  - Pharmacy  - Lab - Cafe  2nd Floor: - PV Lab - Diagnostic Testing (echo, CT, nuclear med)  3rd Floor: - Vacant  4th Floor: - TCTS (cardiothoracic surgery) - AFib Clinic - Structural Heart Clinic - Vascular Surgery  - Vascular Ultrasound  5th Floor: - HeartCare Cardiology (general and EP) - Clinical Pharmacy for coumadin, hypertension, lipid, weight-loss medications, and med management appointments    Valet parking services will be available as well.

## 2023-05-12 ENCOUNTER — Telehealth (HOSPITAL_COMMUNITY): Payer: Self-pay | Admitting: *Deleted

## 2023-05-12 NOTE — Telephone Encounter (Signed)
 Patient given detailed instructions per Myocardial Perfusion Study Information Sheet for the test on 05/16/2023 at 10:15. Patient notified to arrive 15 minutes early and that it is imperative to arrive on time for appointment to keep from having the test rescheduled.  If you need to cancel or reschedule your appointment, please call the office within 24 hours of your appointment. . Patient verbalized understanding.Rachael Peters

## 2023-05-12 NOTE — Telephone Encounter (Signed)
 err

## 2023-05-16 ENCOUNTER — Ambulatory Visit (HOSPITAL_COMMUNITY): Attending: Cardiovascular Disease

## 2023-05-16 DIAGNOSIS — R079 Chest pain, unspecified: Secondary | ICD-10-CM | POA: Diagnosis not present

## 2023-05-16 LAB — MYOCARDIAL PERFUSION IMAGING
Angina Index: 0
Duke Treadmill Score: 9
Estimated workload: 10.1
Exercise duration (min): 9 min
Exercise duration (sec): 15 s
LV dias vol: 67 mL (ref 46–106)
LV sys vol: 27 mL
MPHR: 156 {beats}/min
Nuc Stress EF: 59 %
Peak HR: 141 {beats}/min
Percent HR: 88 %
Rest HR: 55 {beats}/min
Rest Nuclear Isotope Dose: 10.5 mCi
SDS: 0
SRS: 0
SSS: 0
ST Depression (mm): 0 mm
Stress Nuclear Isotope Dose: 32.9 mCi
TID: 0.93

## 2023-05-16 MED ORDER — TECHNETIUM TC 99M TETROFOSMIN IV KIT
10.5000 | PACK | Freq: Once | INTRAVENOUS | Status: AC | PRN
  Administered 2023-05-16: 10.5 via INTRAVENOUS

## 2023-05-16 MED ORDER — TECHNETIUM TC 99M TETROFOSMIN IV KIT
32.9000 | PACK | Freq: Once | INTRAVENOUS | Status: AC | PRN
  Administered 2023-05-16: 32.9 via INTRAVENOUS

## 2023-05-17 ENCOUNTER — Encounter: Payer: Self-pay | Admitting: Cardiology

## 2023-06-02 ENCOUNTER — Ambulatory Visit (HOSPITAL_COMMUNITY): Attending: Cardiology

## 2023-06-02 DIAGNOSIS — R0609 Other forms of dyspnea: Secondary | ICD-10-CM | POA: Diagnosis present

## 2023-06-02 LAB — ECHOCARDIOGRAM COMPLETE
Area-P 1/2: 3.36 cm2
S' Lateral: 3 cm

## 2023-06-03 NOTE — Progress Notes (Signed)
 Normal heart function.  Minimally increased ascending aorta ventricle size, could be nonspecific findings. Repeat echocardiogram in 1 year.  Thanks MJP

## 2023-06-06 ENCOUNTER — Other Ambulatory Visit: Payer: Self-pay

## 2023-06-06 DIAGNOSIS — R0609 Other forms of dyspnea: Secondary | ICD-10-CM

## 2023-10-05 ENCOUNTER — Encounter: Payer: Self-pay | Admitting: Registered Nurse

## 2023-10-05 ENCOUNTER — Other Ambulatory Visit: Payer: Self-pay | Admitting: Registered Nurse

## 2023-10-05 DIAGNOSIS — R911 Solitary pulmonary nodule: Secondary | ICD-10-CM

## 2023-10-07 ENCOUNTER — Ambulatory Visit
Admission: RE | Admit: 2023-10-07 | Discharge: 2023-10-07 | Disposition: A | Discharge status: Home / Self Care | Source: Ambulatory Visit | Attending: Registered Nurse | Admitting: Registered Nurse

## 2023-10-07 DIAGNOSIS — R911 Solitary pulmonary nodule: Secondary | ICD-10-CM

## 2023-11-24 ENCOUNTER — Encounter: Payer: Self-pay | Admitting: Obstetrics and Gynecology

## 2023-11-24 NOTE — Telephone Encounter (Signed)
 Last AEX 01/03/23 -BS Next 01/05/24 -BS  Dr. Nikki -please review patient request and advise if OV preferred.

## 2024-01-05 ENCOUNTER — Encounter: Payer: Self-pay | Admitting: Obstetrics and Gynecology

## 2024-01-05 ENCOUNTER — Ambulatory Visit: Payer: BC Managed Care – PPO | Admitting: Obstetrics and Gynecology

## 2024-01-05 VITALS — BP 110/74 | HR 70 | Ht 70.25 in | Wt 181.0 lb

## 2024-01-05 DIAGNOSIS — Z01419 Encounter for gynecological examination (general) (routine) without abnormal findings: Secondary | ICD-10-CM | POA: Diagnosis not present

## 2024-01-05 DIAGNOSIS — R1900 Intra-abdominal and pelvic swelling, mass and lump, unspecified site: Secondary | ICD-10-CM

## 2024-01-05 DIAGNOSIS — N951 Menopausal and female climacteric states: Secondary | ICD-10-CM

## 2024-01-05 DIAGNOSIS — Z1331 Encounter for screening for depression: Secondary | ICD-10-CM

## 2024-01-05 DIAGNOSIS — M85852 Other specified disorders of bone density and structure, left thigh: Secondary | ICD-10-CM | POA: Diagnosis not present

## 2024-01-05 NOTE — Progress Notes (Signed)
 63 y.o. G46P0100 Married Caucasian female here for annual exam.  Has been having abdominal pain for about 1.5 months. In ovaries area sometimes. Wants to discuss estradiol patches.  Also noting bloating.  Pain comes and goes.  She will see her GI.   Having hot flashes.  Gaining weight.   Vaginal dryness.   No vaginal bleeding.   Hx ER+/PR+ right breast cancer.  Status post lumpectomy, XRT, and Arimidex .   On Lexapro  for over 20 years.     Did labs with her PCP.  Patient states that her kidney test was slightly elevated, which sound like creatinine.  She had normal stool studies.    PCP: Royden Ronal Czar, FNP   Patient's last menstrual period was 02/15/2013 (approximate).           Sexually active: Yes.    The current method of family planning is post menopausal status.    Menopausal hormone therapy:  n/a Exercising: Yes.    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  Mammogram:   03/19/23 L Breast - Breast Density Cat C, BIRADS Cat 2 benign  Colonoscopy:  2022 - due in 2032.  Dr. Rosalie.  Bone Density:  10/11/22  Result  osteopenia left hip  Immunization History  Administered Date(s) Administered   PFIZER(Purple Top)SARS-COV-2 Vaccination 05/04/2019, 05/29/2019      reports that she quit smoking about 5 years ago. Her smoking use included cigarettes. She started smoking about 6 years ago. She has a 5 pack-year smoking history. She has never used smokeless tobacco. She reports that she does not currently use alcohol. She reports that she does not use drugs.  Past  Medical History:  Diagnosis Date   Abnormal Pap smear of cervix 1990   hx cervical conization for abnormal pap--paps normal since   Anxiety    Asthma    Breast cancer (HCC)    Cat allergies    COPD (chronic obstructive pulmonary disease) (HCC)    PONV (postoperative nausea and vomiting)     Past Surgical History:  Procedure Laterality Date   APPENDECTOMY  1997   BREAST BIOPSY Right 03/25/2022   US  RT BREAST BX W LOC DEV 1ST LESION IMG BX SPEC US  GUIDE 03/25/2022 [facility]   BREAST BIOPSY  05/03/2022   US  RT RADIOACTIVE SEED LOC 05/03/2022 [facility]   BREAST LUMPECTOMY WITH RADIOACTIVE SEED AND SENTINEL LYMPH NODE BIOPSY Right 05/04/2022   Procedure: RIGHT BREAST LUMPECTOMY WITH RADIOACTIVE SEED AND AXILLARY SENTINEL LYMPH NODE BIOPSY;  Surgeon: Ebbie Cough, MD;  Location: [facility];  Service: General;  Laterality: Right;   CERVICAL CONIZATION W/BX  1990   AND CERVIX LESION DESTRUCTION     RETINAL TEAR REPAIR CRYOTHERAPY Right    ROBOTIC ASSITED PARTIAL NEPHRECTOMY Right 04/20/2018   Procedure: XI ROBOTIC ASSITED LAPAROSCOPIC  PARTIAL NEPHRECTOMY;  Surgeon: Renda Glance, MD;  Location: [facility];  Service: Urology;  Laterality: Right;    Current Outpatient Medications  Medication Sig Dispense Refill   albuterol  (VENTOLIN  HFA) 108 (90 Base) MCG/ACT inhaler albuterol  sulfate HFA 90 mcg/actuation aerosol inhaler  1 2 INHALATIONS EVERY 4 6 HOURS AS NEEDED FOR SHORTNESS OF BREATH.     aspirin  EC 81 MG tablet Take 1 tablet (81 mg total) by mouth daily. Swallow whole.     budesonide -formoterol  (SYMBICORT ) 80-4.5 MCG/ACT inhaler TAKE 2 PUFFS BY MOUTH TWICE A DAY     Cyanocobalamin 3000 MCG SUBL Place 1 tablet under the tongue daily.     escitalopram  (LEXAPRO ) 10 MG tablet 1 tablet     nitroGLYCERIN  (NITROSTAT ) 0.4 MG SL tablet Place 1 tablet (0.4 mg total) under the tongue every 5 (five) minutes as needed for chest pain. 60 tablet 3   No current  facility-administered medications for this visit.    ALLERGIES: Codeine  Family History  Problem Relation Age of Onset   Heart disease Maternal Grandmother    Heart disease Maternal Grandfather    Diabetes Maternal Grandfather    Heart disease Paternal Grandmother    Heart disease Paternal Grandfather    Breast cancer Mother 62   Breast cancer Sister 70   Heart disease Father    Stroke Father    Heart attack Father     Review of Systems  All other systems reviewed and are negative.   PHYSICAL EXAM:  BP 110/74 (BP Location: Left Arm, Patient Position: Sitting)   Pulse 70   Ht 5' 10.25 (1.784 m)   Wt 181 lb (82.1 kg)   LMP 02/15/2013 (Approximate) Comment: sexually active  SpO2 100%   BMI 25.79 kg/m     General appearance: alert, cooperative and appears stated age Head: normocephalic, without obvious abnormality, atraumatic Neck: no adenopathy, supple, symmetrical, trachea midline and thyroid normal to inspection and palpation Lungs: clear to auscultation bilaterally Breasts: normal appearance, no masses or tenderness, No nipple retraction or dimpling, No nipple discharge or bleeding, No axillary adenopathy Heart: regular rate and rhythm Abdomen: soft, slight discomfort in the LLQ.  No guarding or rebound.  No masses, no organomegaly Extremities: extremities normal, atraumatic, no cyanosis or edema Skin: skin color, texture, turgor normal. No rashes or lesions Lymph nodes: cervical, supraclavicular, and axillary nodes normal. Neurologic: grossly normal  Pelvic: External genitalia:  no lesions              No abnormal inguinal nodes palpated.              Urethra:  normal appearing urethra with no masses, tenderness or lesions              Bartholins and Skenes: normal                 Vagina: normal appearing vagina with normal color and discharge, no lesions              Cervix: no lesions              Pap taken: no Bimanual Exam:  Uterus:  normal size, contour,  position, consistency, mobility, non-tender              Adnexa: no mass, fullness, tenderness              Rectal exam: yes.  Confirms.              Anus:  normal  sphincter tone, no lesions  Chaperone was present for exam:  Kari HERO, CMA  ASSESSMENT: Well woman visit with gynecologic exam. Right breast cancer.  Status post lumpectomy and XRT.   Off Arimidex .  Negative personal genetic testing.  FH of breast cancer in mother and sister.  Sister had negative BRCA testing.  Remote hx of cervical conization.  Osteopenia. Due for follow up in August, 2026.  Former smoker.  Status post partial right nephrectomy for cystic angiomyolipoma.  Anxiety. On Lexapro . Hx UTIs.    Abdominal swelling with LLQ discomfort.  Hx IBS.  FH CAD.  PHQ-2-9: 0  PLAN: Mammogram screening discussed. Self breast awareness reviewed. Pap and HRV collected:  no Guidelines for Calcium, Vitamin D , regular exercise program including cardiovascular and weight bearing exercise. Medication refills:  NA HRT not recommended due to hx of ER/PR breast cancer.  Tx options for Hot flashes:  [place]oz[place], Effexor, Paxil, Gabapentin.  She will discuss with her PCP switch from Lexapro  to Effexor.   Return for pelvic US . She will see GI.  We discussed that her GI may order a CT scan to evaluate for diverticulitis.  CT scan can also see pelvic anatomy but not in the same way as a pelvic ultrasound.  Dexa ordered for [facility] for next year. Follow up:  yearly and prn.

## 2024-01-05 NOTE — Patient Instructions (Addendum)
 Phone number for scheduling bone density at [place], [phone].    Venlafaxine Extended-Release Capsules What is this medication? VENLAFAXINE (VEN la fax een) treats depression and anxiety. It increases the amount of serotonin and norepinephrine in the brain, hormones that help regulate mood. It belongs to a group of medications called SNRIs. This medicine may be used for other purposes; ask your health care provider or pharmacist if you have questions. COMMON BRAND NAME(S): Effexor XR What should I tell my care team before I take this medication? They need to know if you have any of these conditions: Bleeding disorders Glaucoma Heart disease High blood pressure High cholesterol Kidney disease Liver disease Low levels of sodium in the blood Mania or bipolar disorder Seizures Suicidal thoughts, plans, or attempt by you or a family member Take medications that treat or prevent blood clots Thyroid disease An unusual or allergic reaction to venlafaxine, other medications, foods, dyes, or preservatives Pregnant or trying to get pregnant Breastfeeding How should I use this medication? Take this medication by mouth with a full glass of water . Take it as directed on the prescription label. Do not cut, crush, or chew this medication. Take it with food. You may open the capsule and put the contents in 1 teaspoon of applesauce. Swallow the medication and applesauce right away. Do not chew the medication or applesauce. Follow with a glass of water  to ensure complete swallowing of the pellets. Try to take your medication at about the same time each day. Do not take your medication more often than directed. Keep taking this medication unless your care team tells you to stop. Stopping it too quickly can cause serious side effects. It can also make your condition worse. A special MedGuide will be given to you by the pharmacist with each prescription and refill. Be sure to read this information  carefully each time. Talk to your care team about the use of this medication in children. Special care may be needed. Overdosage: If you think you have taken too much of this medicine contact a poison control center or emergency room at once. NOTE: This medicine is only for you. Do not share this medicine with others. What if I miss a dose? If you miss a dose, take it as soon as you can. If it is almost time for your next dose, take only that dose. Do not take double or extra doses. What may interact with this medication? Do not take this medication with any of the following: Alcohol Certain medications for fungal infections, such as fluconazole, itraconazole, ketoconazole, posaconazole, voriconazole Cisapride Desvenlafaxine Dronedarone Duloxetine Levomilnacipran Linezolid MAOIs, such as Carbex, Eldepryl, Marplan, Nardil, and Parnate Methylene blue (injected into a vein) Milnacipran Pimozide Thioridazine This medication may also interact with the following: Amphetamines Aspirin  and aspirin -like medications Certain medications for mental health conditions Certain medications for migraine headaches, such as almotriptan, eletriptan, frovatriptan, naratriptan, rizatriptan, sumatriptan, zolmitriptan Certain medications for sleep Certain medications that treat or prevent blood clots, such as dalteparin, enoxaparin , warfarin Cimetidine Clozapine Diuretics Fentanyl  Furazolidone Indinavir Isoniazid Lithium Metoprolol NSAIDS, medications for pain and inflammation, such as ibuprofen or naproxen Other medications that cause heart rhythm changes Procarbazine Rasagiline Supplements, such as St. John's wort, kava kava, valerian Tramadol  Tryptophan This list may not describe all possible interactions. Give your health care provider a list of all the medicines, herbs, non-prescription drugs, or dietary supplements you use. Also tell them if you smoke, drink alcohol, or use illegal drugs.  Some items may interact with  your medicine. What should I watch for while using this medication? Tell your care team if your symptoms do not get better or if they get worse. Visit your care team for regular checks on your progress. Because it may take several weeks to see the full effects of this medication, it is important to continue your treatment as prescribed by your care team. Watch for new or worsening thoughts of suicide or depression. This includes sudden changes in mood, behaviors, or thoughts. These changes can happen at any time but are more common in the beginning of treatment or after a change in dose. Call your care team right away if you experience these thoughts or worsening depression. This medication may cause mood and behavior changes, such as anxiety, nervousness, irritability, hostility, restlessness, excitability, hyperactivity, or trouble sleeping. These changes can happen at any time but are more common in the beginning of treatment or after a change in dose. Call your care team right away if you notice any of these symptoms. This medication can cause an increase in blood pressure. Check with your care team for instructions on monitoring your blood pressure while taking this medication. This medication may affect your coordination, reaction time, or judgment. Do not drive or operate machinery until you know how this medication affects you. Sit up or stand slowly to reduce the risk of dizzy or fainting spells. Drinking alcohol with this medication can increase the risk of these side effects. Your mouth may get dry. Chewing sugarless gum or sucking hard candy and drinking plenty of water  may help. Contact your care team if the problem does not go away or is severe. What side effects may I notice from receiving this medication? Side effects that you should report to your care team as soon as possible: Allergic reactions--skin rash, itching, hives, swelling of the face, lips, tongue,  or throat Bleeding--bloody or black, tar-like stools, red or dark brown urine, vomiting blood or brown material that looks like coffee grounds, small, red or purple spots on skin, unusual bleeding or bruising Heart rhythm changes--fast or irregular heartbeat, dizziness, feeling faint or lightheaded, chest pain, trouble breathing Increase in blood pressure Loss of appetite with weight loss Low sodium level--muscle weakness, fatigue, dizziness, headache, confusion Serotonin syndrome--irritability, confusion, fast or irregular heartbeat, muscle stiffness, twitching muscles, sweating, high fever, seizures, chills, vomiting, diarrhea Sudden eye pain or change in vision such as blurry vision, seeing halos around lights, vision loss Thoughts of suicide or self-harm, worsening mood, feelings of depression Side effects that usually do not require medical attention (report to your care team if they continue or are bothersome): Anxiety, nervousness Change in sex drive or performance Dizziness Dry mouth Excessive sweating Nausea Tremors or shaking Trouble sleeping This list may not describe all possible side effects. Call your doctor for medical advice about side effects. You may report side effects to FDA at 1-800-FDA-1088. Where should I keep my medication? Keep out of the reach of children and pets. Store at a controlled temperature between 20 and 25 degrees C (68 degrees and 77 degrees F), in a dry place. Throw away any unused medication after the expiration date. NOTE: This sheet is a summary. It may not cover all possible information. If you have questions about this medicine, talk to your doctor, pharmacist, or health care provider.  2024 [employer]/Gold Standard (2022-01-28 00:00:00)  EXERCISE AND DIET:  We recommended that you start or continue a regular exercise program for good health. Regular exercise means any activity  that makes your heart beat faster and makes you sweat.  We recommend  exercising at least 30 minutes per day at least 3 days a week, preferably 4 or 5.  We also recommend a diet low in fat and sugar.  Inactivity, poor dietary choices and obesity can cause diabetes, heart attack, stroke, and kidney damage, among others.    ALCOHOL AND SMOKING:  Women should limit their alcohol intake to no more than 7 drinks/beers/glasses of wine (combined, not each!) per week. Moderation of alcohol intake to this level decreases your risk of breast cancer and liver damage. And of course, no recreational drugs are part of a healthy lifestyle.  And absolutely no smoking or even second hand smoke. Most people know smoking can cause heart and lung diseases, but did you know it also contributes to weakening of your bones? Aging of your skin?  Yellowing of your teeth and nails?  CALCIUM AND VITAMIN D :  Adequate intake of calcium and Vitamin D  are recommended.  The recommendations for exact amounts of these supplements seem to change often, but generally speaking 600 mg of calcium (either carbonate or citrate) and 800 units of Vitamin D  per day seems prudent. Certain women may benefit from higher intake of Vitamin D .  If you are among these women, your doctor will have told you during your visit.    PAP SMEARS:  Pap smears, to check for cervical cancer or precancers,  have traditionally been done yearly, although recent scientific advances have shown that most women can have pap smears less often.  However, every woman still should have a physical exam from her gynecologist every year. It will include a breast check, inspection of the vulva and vagina to check for abnormal growths or skin changes, a visual exam of the cervix, and then an exam to evaluate the size and shape of the uterus and ovaries.  And after 62 years of age, a rectal exam is indicated to check for rectal cancers. We will also provide age appropriate advice regarding health maintenance, like when you should have certain vaccines,  screening for sexually transmitted diseases, bone density testing, colonoscopy, mammograms, etc.   MAMMOGRAMS:  All women over 65 years old should have a yearly mammogram. Many facilities now offer a 3D mammogram, which may cost around $50 extra out of pocket. If possible,  we recommend you accept the option to have the 3D mammogram performed.  It both reduces the number of women who will be called back for extra views which then turn out to be normal, and it is better than the routine mammogram at detecting truly abnormal areas.    COLONOSCOPY:  Colonoscopy to screen for colon cancer is recommended for all women at age 67.  We know, you hate the idea of the prep.  We agree, BUT, having colon cancer and not knowing it is worse!!  Colon cancer so often starts as a polyp that can be seen and removed at colonscopy, which can quite literally save your life!  And if your first colonoscopy is normal and you have no family history of colon cancer, most women don't have to have it again for 10 years.  Once every ten years, you can do something that may end up saving your life, right?  We will be happy to help you get it scheduled when you are ready.  Be sure to check your insurance coverage so you understand how much it will cost.  It may be covered as  a preventative service at no cost, but you should check your particular policy.

## 2024-01-10 ENCOUNTER — Other Ambulatory Visit: Payer: Self-pay | Admitting: Gastroenterology

## 2024-01-10 DIAGNOSIS — R140 Abdominal distension (gaseous): Secondary | ICD-10-CM

## 2024-01-10 DIAGNOSIS — K2900 Acute gastritis without bleeding: Secondary | ICD-10-CM

## 2024-01-10 DIAGNOSIS — K317 Polyp of stomach and duodenum: Secondary | ICD-10-CM

## 2024-01-10 DIAGNOSIS — R933 Abnormal findings on diagnostic imaging of other parts of digestive tract: Secondary | ICD-10-CM

## 2024-01-10 DIAGNOSIS — K5901 Slow transit constipation: Secondary | ICD-10-CM

## 2024-01-10 DIAGNOSIS — R194 Change in bowel habit: Secondary | ICD-10-CM

## 2024-01-17 ENCOUNTER — Inpatient Hospital Stay
Admission: RE | Admit: 2024-01-17 | Discharge: 2024-01-17 | Disposition: A | Source: Ambulatory Visit | Attending: Gastroenterology | Admitting: Gastroenterology

## 2024-01-17 DIAGNOSIS — R933 Abnormal findings on diagnostic imaging of other parts of digestive tract: Secondary | ICD-10-CM

## 2024-01-17 DIAGNOSIS — K2900 Acute gastritis without bleeding: Secondary | ICD-10-CM

## 2024-01-17 DIAGNOSIS — K317 Polyp of stomach and duodenum: Secondary | ICD-10-CM

## 2024-01-17 DIAGNOSIS — R194 Change in bowel habit: Secondary | ICD-10-CM

## 2024-01-17 DIAGNOSIS — R140 Abdominal distension (gaseous): Secondary | ICD-10-CM

## 2024-01-17 DIAGNOSIS — K5901 Slow transit constipation: Secondary | ICD-10-CM

## 2024-01-17 MED ORDER — IOPAMIDOL (ISOVUE-300) INJECTION 61%
100.0000 mL | Freq: Once | INTRAVENOUS | Status: AC | PRN
  Administered 2024-01-17: 100 mL via INTRAVENOUS

## 2024-01-26 ENCOUNTER — Encounter: Payer: Self-pay | Admitting: Obstetrics and Gynecology

## 2024-01-29 ENCOUNTER — Encounter: Payer: Self-pay | Admitting: Obstetrics and Gynecology

## 2024-01-30 NOTE — Telephone Encounter (Signed)
 Also see MyChart encounter dated 01/26/24.   PUS cancelled.   Encounter closed.

## 2024-01-30 NOTE — Telephone Encounter (Signed)
Routing FYI.   Encounter closed.

## 2024-02-07 ENCOUNTER — Other Ambulatory Visit

## 2024-02-07 ENCOUNTER — Other Ambulatory Visit: Admitting: Obstetrics and Gynecology

## 2024-03-22 ENCOUNTER — Other Ambulatory Visit: Payer: Self-pay | Admitting: Obstetrics and Gynecology

## 2024-03-22 DIAGNOSIS — Z9889 Other specified postprocedural states: Secondary | ICD-10-CM

## 2024-03-28 ENCOUNTER — Encounter

## 2025-01-08 ENCOUNTER — Ambulatory Visit: Admitting: Obstetrics and Gynecology
